# Patient Record
Sex: Male | Born: 1950 | Hispanic: No | Smoking: Never smoker
Health system: Southern US, Community
[De-identification: ages and names within clinical notes are randomized; demographics above are authoritative.]

## PROBLEM LIST (undated history)

## (undated) DIAGNOSIS — E785 Hyperlipidemia, unspecified: Secondary | ICD-10-CM

## (undated) DIAGNOSIS — E119 Type 2 diabetes mellitus without complications: Secondary | ICD-10-CM

## (undated) HISTORY — DX: Type 2 diabetes mellitus without complications: E11.9

---

## 2002-03-20 ENCOUNTER — Encounter: Payer: Self-pay | Admitting: Urology

## 2002-03-20 ENCOUNTER — Encounter: Admission: RE | Admit: 2002-03-20 | Discharge: 2002-03-20 | Payer: Self-pay | Admitting: Urology

## 2002-06-30 ENCOUNTER — Emergency Department (HOSPITAL_COMMUNITY): Admission: EM | Admit: 2002-06-30 | Discharge: 2002-06-30 | Payer: Self-pay | Admitting: Emergency Medicine

## 2002-07-05 ENCOUNTER — Emergency Department (HOSPITAL_COMMUNITY): Admission: EM | Admit: 2002-07-05 | Discharge: 2002-07-05 | Payer: Self-pay | Admitting: Emergency Medicine

## 2002-07-30 ENCOUNTER — Emergency Department (HOSPITAL_COMMUNITY): Admission: EM | Admit: 2002-07-30 | Discharge: 2002-07-31 | Payer: Self-pay | Admitting: Emergency Medicine

## 2002-07-31 ENCOUNTER — Encounter: Payer: Self-pay | Admitting: Emergency Medicine

## 2003-02-08 ENCOUNTER — Emergency Department (HOSPITAL_COMMUNITY): Admission: EM | Admit: 2003-02-08 | Discharge: 2003-02-08 | Payer: Self-pay

## 2005-05-20 ENCOUNTER — Emergency Department (HOSPITAL_COMMUNITY): Admission: EM | Admit: 2005-05-20 | Discharge: 2005-05-20 | Payer: Self-pay | Admitting: Family Medicine

## 2008-09-26 ENCOUNTER — Encounter: Admission: RE | Admit: 2008-09-26 | Discharge: 2008-09-26 | Payer: Self-pay | Admitting: Internal Medicine

## 2010-04-19 ENCOUNTER — Ambulatory Visit: Payer: Self-pay | Admitting: Internal Medicine

## 2010-04-19 ENCOUNTER — Ambulatory Visit (HOSPITAL_COMMUNITY): Admission: RE | Admit: 2010-04-19 | Discharge: 2010-04-19 | Payer: Self-pay | Admitting: Family Medicine

## 2010-04-19 ENCOUNTER — Encounter (INDEPENDENT_AMBULATORY_CARE_PROVIDER_SITE_OTHER): Payer: Self-pay | Admitting: Family Medicine

## 2010-04-19 LAB — CONVERTED CEMR LAB
Alkaline Phosphatase: 79 units/L (ref 39–117)
Basophils Absolute: 0 10*3/uL (ref 0.0–0.1)
Basophils Relative: 0 % (ref 0–1)
CO2: 22 meq/L (ref 19–32)
Cholesterol: 127 mg/dL (ref 0–200)
Creatinine, Ser: 0.98 mg/dL (ref 0.40–1.50)
Eosinophils Absolute: 0.1 10*3/uL (ref 0.0–0.7)
Glucose, Bld: 96 mg/dL (ref 70–99)
HDL: 46 mg/dL (ref >39)
LDL Cholesterol: 64 mg/dL (ref 0–99)
MCHC: 33.4 g/dL (ref 30.0–36.0)
MCV: 92.2 fL (ref 78.0–100.0)
Monocytes Absolute: 0.4 10*3/uL (ref 0.1–1.0)
Monocytes Relative: 8 % (ref 3–12)
Neutro Abs: 3.6 10*3/uL (ref 1.7–7.7)
Neutrophils Relative %: 66 % (ref 43–77)
RBC: 5.29 M/uL (ref 4.22–5.81)
RDW: 13.7 % (ref 11.5–15.5)
Total Bilirubin: 0.6 mg/dL (ref 0.3–1.2)
Total CHOL/HDL Ratio: 2.8
Triglycerides: 84 mg/dL (ref <150)
VLDL: 17 mg/dL (ref 0–40)

## 2010-06-07 ENCOUNTER — Encounter (INDEPENDENT_AMBULATORY_CARE_PROVIDER_SITE_OTHER): Payer: Self-pay | Admitting: Family Medicine

## 2010-06-07 ENCOUNTER — Ambulatory Visit: Payer: Self-pay | Admitting: Internal Medicine

## 2010-06-07 LAB — CONVERTED CEMR LAB
Bacteria, UA: NONE SEEN
Casts: NONE SEEN /lpf

## 2010-06-15 ENCOUNTER — Ambulatory Visit: Payer: Self-pay | Admitting: Internal Medicine

## 2011-05-14 ENCOUNTER — Emergency Department (HOSPITAL_COMMUNITY)
Admission: EM | Admit: 2011-05-14 | Discharge: 2011-05-14 | Disposition: A | Discharge status: Home / Self Care | Payer: Self-pay | Attending: Emergency Medicine | Admitting: Emergency Medicine

## 2011-05-14 ENCOUNTER — Inpatient Hospital Stay (INDEPENDENT_AMBULATORY_CARE_PROVIDER_SITE_OTHER)
Admission: RE | Admit: 2011-05-14 | Discharge: 2011-05-14 | Disposition: A | Discharge status: Home / Self Care | Payer: Self-pay | Source: Ambulatory Visit | Attending: Family Medicine | Admitting: Family Medicine

## 2011-05-14 ENCOUNTER — Emergency Department (HOSPITAL_COMMUNITY): Payer: Self-pay

## 2011-05-14 DIAGNOSIS — R12 Heartburn: Secondary | ICD-10-CM | POA: Insufficient documentation

## 2011-05-14 DIAGNOSIS — M25529 Pain in unspecified elbow: Secondary | ICD-10-CM | POA: Insufficient documentation

## 2011-05-14 DIAGNOSIS — M25519 Pain in unspecified shoulder: Secondary | ICD-10-CM | POA: Insufficient documentation

## 2011-05-14 DIAGNOSIS — E7800 Pure hypercholesterolemia, unspecified: Secondary | ICD-10-CM | POA: Insufficient documentation

## 2012-03-31 ENCOUNTER — Encounter (HOSPITAL_COMMUNITY): Payer: Self-pay | Admitting: Physical Medicine and Rehabilitation

## 2012-03-31 ENCOUNTER — Emergency Department (HOSPITAL_COMMUNITY)
Admission: EM | Admit: 2012-03-31 | Discharge: 2012-03-31 | Disposition: A | Discharge status: Home / Self Care | Payer: Self-pay | Attending: Emergency Medicine | Admitting: Emergency Medicine

## 2012-03-31 DIAGNOSIS — R51 Headache: Secondary | ICD-10-CM | POA: Insufficient documentation

## 2012-03-31 DIAGNOSIS — W19XXXA Unspecified fall, initial encounter: Secondary | ICD-10-CM

## 2012-03-31 DIAGNOSIS — M79609 Pain in unspecified limb: Secondary | ICD-10-CM | POA: Insufficient documentation

## 2012-03-31 DIAGNOSIS — S0180XA Unspecified open wound of other part of head, initial encounter: Secondary | ICD-10-CM | POA: Insufficient documentation

## 2012-03-31 DIAGNOSIS — T148XXA Other injury of unspecified body region, initial encounter: Secondary | ICD-10-CM

## 2012-03-31 DIAGNOSIS — W010XXA Fall on same level from slipping, tripping and stumbling without subsequent striking against object, initial encounter: Secondary | ICD-10-CM | POA: Insufficient documentation

## 2012-03-31 DIAGNOSIS — IMO0002 Reserved for concepts with insufficient information to code with codable children: Secondary | ICD-10-CM | POA: Insufficient documentation

## 2012-03-31 DIAGNOSIS — E785 Hyperlipidemia, unspecified: Secondary | ICD-10-CM | POA: Insufficient documentation

## 2012-03-31 HISTORY — DX: Hyperlipidemia, unspecified: E78.5

## 2012-03-31 MED ORDER — TETANUS-DIPHTH-ACELL PERTUSSIS 5-2.5-18.5 LF-MCG/0.5 IM SUSP
0.5000 mL | Freq: Once | INTRAMUSCULAR | Status: AC
  Administered 2012-03-31: 0.5 mL via INTRAMUSCULAR
  Filled 2012-03-31: qty 0.5

## 2012-03-31 NOTE — Discharge Instructions (Signed)
Laceration Care, Adult A laceration is a cut or lesion that goes through all layers of the skin and into the tissue just beneath the skin. TREATMENT  Some lacerations may not require closure. Some lacerations may not be able to be closed due to an increased risk of infection. It is important to see your caregiver as soon as possible after an injury to minimize the risk of infection and maximize the opportunity for successful closure. If closure is appropriate, pain medicines may be given, if needed. The wound will be cleaned to help prevent infection. Your caregiver will use stitches (sutures), staples, wound glue (adhesive), or skin adhesive strips to repair the laceration. These tools bring the skin edges together to allow for faster healing and a better cosmetic outcome. However, all wounds will heal with a scar. Once the wound has healed, scarring can be minimized by covering the wound with sunscreen during the day for 1 full year. HOME CARE INSTRUCTIONS  For sutures or staples:  Keep the wound clean and dry.   If you were given a bandage (dressing), you should change it at least once a day. Also, change the dressing if it becomes wet or dirty, or as directed by your caregiver.   Wash the wound with soap and water 2 times a day. Rinse the wound off with water to remove all soap. Pat the wound dry with a clean towel.   After cleaning, apply a thin layer of the antibiotic ointment as recommended by your caregiver. This will help prevent infection and keep the dressing from sticking.   You may shower as usual after the first 24 hours. Do not soak the wound in water until the sutures are removed.   Only take over-the-counter or prescription medicines for pain, discomfort, or fever as directed by your caregiver.   Get your sutures or staples removed as directed by your caregiver.  For skin adhesive strips:  Keep the wound clean and dry.   Do not get the skin adhesive strips wet. You may bathe  carefully, using caution to keep the wound dry.   If the wound gets wet, pat it dry with a clean towel.   Skin adhesive strips will fall off on their own. You may trim the strips as the wound heals. Do not remove skin adhesive strips that are still stuck to the wound. They will fall off in time.  For wound adhesive:  You may briefly wet your wound in the shower or bath. Do not soak or scrub the wound. Do not swim. Avoid periods of heavy perspiration until the skin adhesive has fallen off on its own. After showering or bathing, gently pat the wound dry with a clean towel.   Do not apply liquid medicine, cream medicine, or ointment medicine to your wound while the skin adhesive is in place. This may loosen the film before your wound is healed.   If a dressing is placed over the wound, be careful not to apply tape directly over the skin adhesive. This may cause the adhesive to be pulled off before the wound is healed.   Avoid prolonged exposure to sunlight or tanning lamps while the skin adhesive is in place. Exposure to ultraviolet light in the first year will darken the scar.   The skin adhesive will usually remain in place for 5 to 10 days, then naturally fall off the skin. Do not pick at the adhesive film.  You may need a tetanus shot if:  You   cannot remember when you had your last tetanus shot.   You have never had a tetanus shot.  If you get a tetanus shot, your arm may swell, get red, and feel warm to the touch. This is common and not a problem. If you need a tetanus shot and you choose not to have one, there is a rare chance of getting tetanus. Sickness from tetanus can be serious. SEEK MEDICAL CARE IF:   You have redness, swelling, or increasing pain in the wound.   You see a red line that goes away from the wound.   You have yellowish-white fluid (pus) coming from the wound.   You have a fever.   You notice a bad smell coming from the wound or dressing.   Your wound breaks  open before or after sutures have been removed.   You notice something coming out of the wound such as wood or glass.   Your wound is on your hand or foot and you cannot move a finger or toe.  SEEK IMMEDIATE MEDICAL CARE IF:   Your pain is not controlled with prescribed medicine.   You have severe swelling around the wound causing pain and numbness or a change in color in your arm, hand, leg, or foot.   Your wound splits open and starts bleeding.   You have worsening numbness, weakness, or loss of function of any joint around or beyond the wound.   You develop painful lumps near the wound or on the skin anywhere on your body.  MAKE SURE YOU:   Understand these instructions.   Will watch your condition.   Will get help right away if you are not doing well or get worse.  Document Released: 11/28/2005 Document Revised: 11/17/2011 Document Reviewed: 05/24/2011 ExitCare Patient Information 2012 [employer]  RESOURCE GUIDE  Dental Problems  Patients with Medicaid: [facility]                     [facility] [address]                                           [address] Phone:  [phone]                                                   Phone:  [phone]  If unable to pay or uninsured, contact:  [facility] or [facility] to become qualified for the adult dental clinic.  Chronic Pain Problems Contact [facility]  [phone] Patients need to be referred by their primary care doctor.  Insufficient Money for Medicine Contact [employer]:  call "211" or [facility] 271-5999.  No Primary Care Doctor Call [facility]  832-8000 Other agencies that provide inexpensive medical care    [facility]  [phone]    [facility]  [phone]    [facility]  [phone]    [facility]  [phone]    [facility]  [phone]    [facility]   [phone]  Psychological Services [facility]  [phone] [facility]  [phone] [facility]   [phone] (emergency   services 641-4993)  Abuse/Neglect [place] Child Abuse Hotline (336) 641-3795 [place] Child Abuse Hotline 800-378-5315 (After Hours)  Emergency Shelter [facility] (336) 271-5985  [facility] (336) 275-9566 [facility] (704) 372-4663  MRSA Hotline #:   832-7006    [place] [facility]  [facility] [place]  [employer]                           [facility] [address] [place]                     [address]         [address]                                                 [place]                              [place] Phone:  [phone]                                  Phone:  [phone]                   Phone:  [phone]  [facility] Phone:  [phone]  [place] Child Abuse Hotline (336) 342-1394 (336) 342-3537 (After Hours)  

## 2012-03-31 NOTE — ED Provider Notes (Signed)
History     CSN: [id number]  Arrival date & time 03/31/12  0825   First MD Initiated Contact with Patient 03/31/12 0830      Chief Complaint  Patient presents with  . Fall  . Laceration    (Consider location/radiation/quality/duration/timing/severity/associated sxs/prior treatment) Patient is a 61 y.o. male presenting with fall and skin laceration.  Fall  Laceration     61yoM pw mutliple abrasions/lacerations after fall. Patient states that just prior to arrival he was walking his dog. He states that the dog lunged and toward another dog pulling him by the arm and causing him to fall onto the sidewalk. He states that he is dragged for a few feet before he let go of the leash. He states that he scraped his face but did not hit his head. He had no loss of consciousness. There is no neck pain, back pain. He complains of minimal pain at the site of multiple abrasions to the dorsal aspect of his right hand as well as minimal pain to his right eyebrow. There's been no change in vision. No headache, dizziness, nausea, vomiting. Unknown last tetanus. Denies anticoagulants.   ED Notes, ED Provider Notes from 03/31/12 0000 to 03/31/12 08:28:17       Juanda Chance, RN 03/31/2012 08:27      Pt presents to department via EMS for evaluation of multiple abrasions and lacerations. States he was walking dog when he got dragged on ground. Abrasions to R hand and knee, 1inch laceration noted to R eyebrow area. Bleeding controlled. Denies pain upon arrival to ED. He is conscious alert and oriented x4.      Past Medical History  Diagnosis Date  . Hyperlipemia     No past surgical history on file.  History reviewed. No pertinent family history.  History  Substance Use Topics  . Smoking status: Never Smoker   . Smokeless tobacco: Not on file  . Alcohol Use: No      Review of Systems  All other systems reviewed and are negative.   except as noted HPI   Allergies  Review of  patient's allergies indicates no known allergies.  Home Medications   Current Outpatient Rx  Name Route Sig Dispense Refill  . ROSUVASTATIN CALCIUM 10 MG PO TABS Oral Take 10 mg by mouth daily.      BP 120/80  Pulse 60  Temp(Src) 97.6 F (36.4 C) (Oral)  Resp 16  SpO2 98%  Physical Exam  Nursing note and vitals reviewed. Constitutional: He is oriented to person, place, and time. He appears well-developed and well-nourished. No distress.  HENT:  Head: Atraumatic.    Mouth/Throat: Oropharynx is clear and moist.       No ttp facial bones including orbits, bridge of nose Poor dentition, intact TM clear   Eyes: Conjunctivae are normal. Pupils are equal, round, and reactive to light.  Neck: Neck supple.  Cardiovascular: Normal rate, regular rhythm, normal heart sounds and intact distal pulses.  Exam reveals no gallop and no friction rub.   No murmur heard. Pulmonary/Chest: Effort normal. No respiratory distress. He has no wheezes. He has no rales.  Abdominal: Soft. Bowel sounds are normal. There is no tenderness. There is no rebound and no guarding.  Musculoskeletal: Normal range of motion. He exhibits no edema and no tenderness.       RUE without bony ttp  Neurological: He is alert and oriented to person, place, and time. No cranial nerve deficit. He exhibits normal  muscle tone. Coordination normal.       Strength 5/5 all extremities    Skin: Skin is warm and dry.     Psychiatric: He has a normal mood and affect.    ED Course  LACERATION REPAIR Date/Time: 03/31/2012 9:22 AM Performed by: Forbes Cellar Authorized by: Forbes Cellar Consent: Verbal consent obtained. Consent given by: patient Patient understanding: patient states understanding of the procedure being performed Patient consent: the patient's understanding of the procedure matches consent given Procedure consent: procedure consent matches procedure scheduled Patient identity confirmed: arm band Time  out: Immediately prior to procedure a "time out" was called to verify the correct patient, procedure, equipment, support staff and site/side marked as required. Body area: head/neck Location details: right eyelid Laceration length: 1 cm Foreign bodies: no foreign bodies Tendon involvement: none Nerve involvement: none Vascular damage: no Anesthesia: local infiltration Local anesthetic: lidocaine 1% without epinephrine Anesthetic total: 1 ml Patient sedated: no Irrigation solution: saline Amount of cleaning: standard Debridement: none Skin closure: 6-0 nylon Number of sutures: 2 Technique: simple Approximation: close Approximation difficulty: simple Dressing: antibiotic ointment Patient tolerance: Patient tolerated the procedure well with no immediate complications.   (including critical care time)  Labs Reviewed - No data to display No results found.   1. Fall   2. Laceration   3. Abrasion       MDM  Mechanical fall without significant BHT. Neuro intact. Tetanus updated today. Wound care and laceration repair. PMD f/u for suture removal.       Forbes Cellar, MD 03/31/12 [phone]

## 2012-03-31 NOTE — ED Notes (Signed)
Pt presents to department via EMS for evaluation of multiple abrasions and lacerations. States he was walking dog when he got dragged on ground. Abrasions to R hand and knee, 1inch laceration noted to R eyebrow area. Bleeding controlled. Denies pain upon arrival to ED. He is conscious alert and oriented x4.

## 2012-05-16 ENCOUNTER — Encounter (HOSPITAL_COMMUNITY)
Admission: EM | Disposition: A | Discharge status: Home / Self Care | Payer: Self-pay | Source: Home / Self Care | Attending: Neurosurgery

## 2012-05-16 ENCOUNTER — Encounter (HOSPITAL_COMMUNITY): Payer: Self-pay | Admitting: *Deleted

## 2012-05-16 ENCOUNTER — Inpatient Hospital Stay (HOSPITAL_COMMUNITY): Payer: Self-pay | Admitting: Anesthesiology

## 2012-05-16 ENCOUNTER — Inpatient Hospital Stay (HOSPITAL_COMMUNITY)
Admission: EM | Admit: 2012-05-16 | Discharge: 2012-05-29 | DRG: 027 | Disposition: A | Discharge status: Home Health | Payer: MEDICAID | Attending: Neurosurgery | Admitting: Neurosurgery

## 2012-05-16 ENCOUNTER — Encounter (HOSPITAL_COMMUNITY): Payer: Self-pay | Admitting: Anesthesiology

## 2012-05-16 ENCOUNTER — Emergency Department (HOSPITAL_COMMUNITY): Payer: Self-pay

## 2012-05-16 DIAGNOSIS — R5381 Other malaise: Secondary | ICD-10-CM | POA: Diagnosis present

## 2012-05-16 DIAGNOSIS — R488 Other symbolic dysfunctions: Secondary | ICD-10-CM | POA: Diagnosis present

## 2012-05-16 DIAGNOSIS — I6200 Nontraumatic subdural hemorrhage, unspecified: Principal | ICD-10-CM | POA: Diagnosis present

## 2012-05-16 DIAGNOSIS — E785 Hyperlipidemia, unspecified: Secondary | ICD-10-CM | POA: Diagnosis present

## 2012-05-16 DIAGNOSIS — S065X9A Traumatic subdural hemorrhage with loss of consciousness of unspecified duration, initial encounter: Secondary | ICD-10-CM

## 2012-05-16 DIAGNOSIS — R269 Unspecified abnormalities of gait and mobility: Secondary | ICD-10-CM | POA: Diagnosis present

## 2012-05-16 DIAGNOSIS — Z9181 History of falling: Secondary | ICD-10-CM

## 2012-05-16 HISTORY — PX: CRANIOTOMY: SHX93

## 2012-05-16 LAB — DIFFERENTIAL
Eosinophils Absolute: 0.2 10*3/uL (ref 0.0–0.7)
Lymphs Abs: 1.1 10*3/uL (ref 0.7–4.0)
Monocytes Absolute: 0.4 10*3/uL (ref 0.1–1.0)
Monocytes Relative: 8 % (ref 3–12)
Neutrophils Relative %: 68 % (ref 43–77)

## 2012-05-16 LAB — COMPREHENSIVE METABOLIC PANEL
Alkaline Phosphatase: 76 U/L (ref 39–117)
BUN: 16 mg/dL (ref 6–23)
Chloride: 104 mEq/L (ref 96–112)
Creatinine, Ser: 0.93 mg/dL (ref 0.50–1.35)
GFR calc Af Amer: >90 mL/min (ref >90)
Glucose, Bld: 157 mg/dL — ABNORMAL HIGH (ref 70–99)
Potassium: 4.5 mEq/L (ref 3.5–5.1)
Total Bilirubin: 0.3 mg/dL (ref 0.3–1.2)

## 2012-05-16 LAB — POCT I-STAT TROPONIN I

## 2012-05-16 LAB — PROTIME-INR: Prothrombin Time: 13 seconds (ref 11.6–15.2)

## 2012-05-16 LAB — CBC
HCT: 43 % (ref 39.0–52.0)
Hemoglobin: 14.8 g/dL (ref 13.0–17.0)
MCH: 30.4 pg (ref 26.0–34.0)
MCHC: 34.4 g/dL (ref 30.0–36.0)
RBC: 4.87 MIL/uL (ref 4.22–5.81)

## 2012-05-16 LAB — APTT: aPTT: 38 seconds — ABNORMAL HIGH (ref 24–37)

## 2012-05-16 SURGERY — CRANIOTOMY HEMATOMA EVACUATION SUBDURAL
Anesthesia: General | Site: Head | Wound class: Clean

## 2012-05-16 MED ORDER — DOCUSATE SODIUM 100 MG PO CAPS
100.0000 mg | ORAL_CAPSULE | Freq: Two times a day (BID) | ORAL | Status: DC
  Administered 2012-05-16 – 2012-05-18 (×5): 100 mg via ORAL
  Filled 2012-05-16 (×8): qty 1

## 2012-05-16 MED ORDER — PROMETHAZINE HCL 25 MG PO TABS: 12.5000 mg | ORAL_TABLET | ORAL | Status: DC | PRN

## 2012-05-16 MED ORDER — SODIUM CHLORIDE 0.9 % IR SOLN
Status: DC | PRN
  Administered 2012-05-16: 11:00:00

## 2012-05-16 MED ORDER — HYDROMORPHONE HCL PF 1 MG/ML IJ SOLN
0.2500 mg | INTRAMUSCULAR | Status: DC | PRN
  Administered 2012-05-16: 0.5 mg via INTRAVENOUS

## 2012-05-16 MED ORDER — MEPERIDINE HCL 50 MG/ML IJ SOLN: 6.2500 mg | INTRAMUSCULAR | Status: DC | PRN

## 2012-05-16 MED ORDER — SODIUM CHLORIDE 0.9 % IV SOLN
INTRAVENOUS | Status: DC | PRN
  Administered 2012-05-16: 12:00:00 via INTRAVENOUS

## 2012-05-16 MED ORDER — PROMETHAZINE HCL 25 MG/ML IJ SOLN
6.2500 mg | INTRAMUSCULAR | Status: DC | PRN
  Administered 2012-05-16: 6.25 mg via INTRAVENOUS
  Filled 2012-05-16: qty 1

## 2012-05-16 MED ORDER — SODIUM CHLORIDE 0.9 % IV SOLN
INTRAVENOUS | Status: DC
  Administered 2012-05-16 – 2012-05-23 (×8): via INTRAVENOUS

## 2012-05-16 MED ORDER — SODIUM CHLORIDE 0.9 % IV BOLUS (SEPSIS)
500.0000 mL | INTRAVENOUS | Status: AC
  Administered 2012-05-16: 500 mL via INTRAVENOUS

## 2012-05-16 MED ORDER — LIDOCAINE HCL (CARDIAC) 20 MG/ML IV SOLN
INTRAVENOUS | Status: DC | PRN
  Administered 2012-05-16: 50 mg via INTRAVENOUS

## 2012-05-16 MED ORDER — CEFAZOLIN SODIUM 1-5 GM-% IV SOLN
1.0000 g | Freq: Three times a day (TID) | INTRAVENOUS | Status: AC
  Administered 2012-05-16 – 2012-05-17 (×2): 1 g via INTRAVENOUS
  Filled 2012-05-16 (×4): qty 50

## 2012-05-16 MED ORDER — ONDANSETRON HCL 4 MG/2ML IJ SOLN
INTRAMUSCULAR | Status: DC | PRN
  Administered 2012-05-16: 4 mg via INTRAVENOUS

## 2012-05-16 MED ORDER — SODIUM CHLORIDE 0.9 % IV SOLN
500.0000 mg | Freq: Two times a day (BID) | INTRAVENOUS | Status: DC
  Administered 2012-05-16 – 2012-05-19 (×6): 500 mg via INTRAVENOUS
  Filled 2012-05-16 (×6): qty 5

## 2012-05-16 MED ORDER — ATORVASTATIN CALCIUM 10 MG PO TABS
10.0000 mg | ORAL_TABLET | Freq: Every day | ORAL | Status: DC
  Administered 2012-05-16 – 2012-05-28 (×12): 10 mg via ORAL
  Filled 2012-05-16 (×14): qty 1

## 2012-05-16 MED ORDER — ONDANSETRON HCL 4 MG/2ML IJ SOLN: 4.0000 mg | INTRAMUSCULAR | Status: DC | PRN

## 2012-05-16 MED ORDER — CEFAZOLIN SODIUM 1-5 GM-% IV SOLN
INTRAVENOUS | Status: DC | PRN
  Administered 2012-05-16: 2 g via INTRAVENOUS

## 2012-05-16 MED ORDER — THROMBIN 20000 UNITS EX KIT
PACK | CUTANEOUS | Status: DC | PRN
  Administered 2012-05-16: 12:00:00 via TOPICAL

## 2012-05-16 MED ORDER — LABETALOL HCL 5 MG/ML IV SOLN
INTRAVENOUS | Status: DC | PRN
  Administered 2012-05-16: 5 mg via INTRAVENOUS

## 2012-05-16 MED ORDER — GLYCOPYRROLATE 0.2 MG/ML IJ SOLN
INTRAMUSCULAR | Status: DC | PRN
  Administered 2012-05-16: .6 mg via INTRAVENOUS

## 2012-05-16 MED ORDER — 0.9 % SODIUM CHLORIDE (POUR BTL) OPTIME
TOPICAL | Status: DC | PRN
  Administered 2012-05-16: 1000 mL

## 2012-05-16 MED ORDER — LABETALOL HCL 5 MG/ML IV SOLN: 10.0000 mg | INTRAVENOUS | Status: DC | PRN

## 2012-05-16 MED ORDER — MORPHINE SULFATE 2 MG/ML IJ SOLN
1.0000 mg | INTRAMUSCULAR | Status: DC | PRN
  Administered 2012-05-16 – 2012-05-22 (×8): 2 mg via INTRAVENOUS
  Filled 2012-05-16 (×3): qty 1
  Filled 2012-05-16: qty 2
  Filled 2012-05-16 (×3): qty 1

## 2012-05-16 MED ORDER — FENTANYL CITRATE 0.05 MG/ML IJ SOLN
INTRAMUSCULAR | Status: DC | PRN
  Administered 2012-05-16: 50 ug via INTRAVENOUS
  Administered 2012-05-16: 150 ug via INTRAVENOUS
  Administered 2012-05-16: 50 ug via INTRAVENOUS

## 2012-05-16 MED ORDER — MIDAZOLAM HCL 2 MG/2ML IJ SOLN: 0.5000 mg | Freq: Once | INTRAMUSCULAR | Status: DC | PRN

## 2012-05-16 MED ORDER — SODIUM CHLORIDE 0.9 % IV SOLN
INTRAVENOUS | Status: DC | PRN
  Administered 2012-05-16 (×2): via INTRAVENOUS

## 2012-05-16 MED ORDER — VECURONIUM BROMIDE 10 MG IV SOLR
INTRAVENOUS | Status: DC | PRN
  Administered 2012-05-16: 2 mg via INTRAVENOUS

## 2012-05-16 MED ORDER — LIDOCAINE-EPINEPHRINE 1 %-1:100000 IJ SOLN
INTRAMUSCULAR | Status: DC | PRN
  Administered 2012-05-16: 4 mL

## 2012-05-16 MED ORDER — PROPOFOL 10 MG/ML IV EMUL
INTRAVENOUS | Status: DC | PRN
  Administered 2012-05-16: 200 mg via INTRAVENOUS

## 2012-05-16 MED ORDER — SUCCINYLCHOLINE CHLORIDE 20 MG/ML IJ SOLN
INTRAMUSCULAR | Status: DC | PRN
  Administered 2012-05-16: 100 mg via INTRAVENOUS

## 2012-05-16 MED ORDER — NEOSTIGMINE METHYLSULFATE 1 MG/ML IJ SOLN
INTRAMUSCULAR | Status: DC | PRN
  Administered 2012-05-16: 4 mg via INTRAVENOUS

## 2012-05-16 MED ORDER — MICROFIBRILLAR COLL HEMOSTAT EX PADS: MEDICATED_PAD | CUTANEOUS | Status: DC | PRN

## 2012-05-16 MED ORDER — ONDANSETRON HCL 4 MG PO TABS: 4.0000 mg | ORAL_TABLET | ORAL | Status: DC | PRN

## 2012-05-16 MED ORDER — PANTOPRAZOLE SODIUM 40 MG IV SOLR
40.0000 mg | Freq: Every day | INTRAVENOUS | Status: DC
  Administered 2012-05-16 – 2012-05-18 (×3): 40 mg via INTRAVENOUS
  Filled 2012-05-16 (×4): qty 40

## 2012-05-16 SURGICAL SUPPLY — 72 items
BAG DECANTER FOR FLEXI CONT (MISCELLANEOUS) ×2 IMPLANT
BANDAGE GAUZE 4  KLING STR (GAUZE/BANDAGES/DRESSINGS) IMPLANT
BIT DRILL WIRE PASS 1.3MM (BIT) ×1 IMPLANT
BLADE SURG 11 STRL SS (BLADE) ×2 IMPLANT
BLADE SURG ROTATE 9660 (MISCELLANEOUS) ×2 IMPLANT
BNDG COHESIVE 4X5 TAN NS LF (GAUZE/BANDAGES/DRESSINGS) IMPLANT
BRUSH SCRUB EZ PLAIN DRY (MISCELLANEOUS) ×2 IMPLANT
BUR ACORN 9.0 PRECISION (BURR) ×2 IMPLANT
BUR ROUTER D-58 CRANI (BURR) ×2 IMPLANT
CANISTER SUCTION 2500CC (MISCELLANEOUS) ×2 IMPLANT
CATH ROBINSON RED A/P 10FR (CATHETERS) ×2 IMPLANT
CLIP TI MEDIUM 6 (CLIP) IMPLANT
CLOTH BEACON ORANGE TIMEOUT ST (SAFETY) ×2 IMPLANT
CONT SPEC 4OZ CLIKSEAL STRL BL (MISCELLANEOUS) ×2 IMPLANT
CORDS BIPOLAR (ELECTRODE) ×2 IMPLANT
COVER TABLE BACK 60X90 (DRAPES) IMPLANT
DECANTER SPIKE VIAL GLASS SM (MISCELLANEOUS) IMPLANT
DERMABOND ADVANCED (GAUZE/BANDAGES/DRESSINGS)
DERMABOND ADVANCED .7 DNX12 (GAUZE/BANDAGES/DRESSINGS) IMPLANT
DRAIN SNY WOU 7FLT (WOUND CARE) ×2 IMPLANT
DRAPE NEUROLOGICAL W/INCISE (DRAPES) ×2 IMPLANT
DRAPE SURG 17X23 STRL (DRAPES) IMPLANT
DRAPE WARM FLUID 44X44 (DRAPE) ×2 IMPLANT
DRILL WIRE PASS 1.3MM (BIT) ×2
DRSG OPSITE 4X5.5 SM (GAUZE/BANDAGES/DRESSINGS) ×4 IMPLANT
ELECT CAUTERY BLADE 6.4 (BLADE) ×2 IMPLANT
ELECT REM PT RETURN 9FT ADLT (ELECTROSURGICAL) ×2
ELECTRODE REM PT RTRN 9FT ADLT (ELECTROSURGICAL) ×1 IMPLANT
EVACUATOR SILICONE 100CC (DRAIN) ×2 IMPLANT
GAUZE SPONGE 4X4 16PLY XRAY LF (GAUZE/BANDAGES/DRESSINGS) IMPLANT
GLOVE BIO SURGEON STRL SZ8 (GLOVE) ×4 IMPLANT
GLOVE BIOGEL PI IND STRL 7.0 (GLOVE) ×2 IMPLANT
GLOVE BIOGEL PI INDICATOR 7.0 (GLOVE) ×2
GLOVE EXAM NITRILE LRG STRL (GLOVE) IMPLANT
GLOVE EXAM NITRILE MD LF STRL (GLOVE) ×4 IMPLANT
GLOVE EXAM NITRILE XL STR (GLOVE) IMPLANT
GLOVE EXAM NITRILE XS STR PU (GLOVE) IMPLANT
GLOVE INDICATOR 8.5 STRL (GLOVE) ×2 IMPLANT
GLOVE SS BIOGEL STRL SZ 6.5 (GLOVE) ×2 IMPLANT
GLOVE SUPERSENSE BIOGEL SZ 6.5 (GLOVE) ×2
GOWN BRE IMP SLV AUR LG STRL (GOWN DISPOSABLE) ×2 IMPLANT
GOWN BRE IMP SLV AUR XL STRL (GOWN DISPOSABLE) ×2 IMPLANT
GOWN STRL REIN 2XL LVL4 (GOWN DISPOSABLE) IMPLANT
HEMOSTAT SURGICEL 2X14 (HEMOSTASIS) IMPLANT
HOOK DURA (MISCELLANEOUS) ×2 IMPLANT
KIT BASIN OR (CUSTOM PROCEDURE TRAY) ×2 IMPLANT
KIT ROOM TURNOVER OR (KITS) ×2 IMPLANT
NEEDLE HYPO 25X1 1.5 SAFETY (NEEDLE) ×2 IMPLANT
NS IRRIG 1000ML POUR BTL (IV SOLUTION) ×4 IMPLANT
PACK CRANIOTOMY (CUSTOM PROCEDURE TRAY) ×2 IMPLANT
PAD ARMBOARD 7.5X6 YLW CONV (MISCELLANEOUS) ×4 IMPLANT
PAD EYE OVAL STERILE LF (GAUZE/BANDAGES/DRESSINGS) IMPLANT
PATTIES SURGICAL .25X.25 (GAUZE/BANDAGES/DRESSINGS) IMPLANT
PATTIES SURGICAL .5 X.5 (GAUZE/BANDAGES/DRESSINGS) IMPLANT
PATTIES SURGICAL .5 X3 (DISPOSABLE) IMPLANT
PATTIES SURGICAL 1X1 (DISPOSABLE) IMPLANT
PIN MAYFIELD SKULL DISP (PIN) IMPLANT
SPONGE GAUZE 4X4 12PLY (GAUZE/BANDAGES/DRESSINGS) ×2 IMPLANT
SPONGE NEURO XRAY DETECT 1X3 (DISPOSABLE) IMPLANT
SPONGE SURGIFOAM ABS GEL 100 (HEMOSTASIS) ×2 IMPLANT
STAPLER SKIN PROX WIDE 3.9 (STAPLE) ×2 IMPLANT
STAPLER VISISTAT 35W (STAPLE) IMPLANT
SUT ETHILON 3 0 FSL (SUTURE) IMPLANT
SUT NURALON 4 0 TR CR/8 (SUTURE) ×2 IMPLANT
SUT VIC AB 2-0 CT1 18 (SUTURE) ×2 IMPLANT
SYR 20ML ECCENTRIC (SYRINGE) ×2 IMPLANT
SYR CONTROL 10ML LL (SYRINGE) ×2 IMPLANT
TOWEL OR 17X24 6PK STRL BLUE (TOWEL DISPOSABLE) IMPLANT
TOWEL OR 17X26 10 PK STRL BLUE (TOWEL DISPOSABLE) ×4 IMPLANT
TRAY FOLEY CATH 14FRSI W/METER (CATHETERS) ×2 IMPLANT
UNDERPAD 30X30 INCONTINENT (UNDERPADS AND DIAPERS) IMPLANT
WATER STERILE IRR 1000ML POUR (IV SOLUTION) ×2 IMPLANT

## 2012-05-16 NOTE — ED Provider Notes (Signed)
History     CSN: [id number]  Arrival date & time 05/16/12  7829   First MD Initiated Contact with Patient 05/16/12 0818      Chief Complaint  Patient presents with  . Weakness    left side weakness  . Dizziness    (Consider location/radiation/quality/duration/timing/severity/associated sxs/prior treatment) HPI  Patient relates April 20 he fell while walking his dog. He relates he had a laceration near his lateral right eyebrow. He states he had 2 stitches. He had new loss of consciousness or neurological changes at that time. He was not on anticoagulants. He reports since then he's had intermittent episodes were he feels like he is dizzy it is going to pass out and he sits down he feels better in about 15 minutes. He relates 4 days ago his [place]. He relates the following day he started feeling like he was weak and he initially felt like he couldn't control both his legs. He relates yesterday he noticed he was not walking normally about 10 AM and his brother who is a physician commented he was dragging his left leg. He relates yesterday had a bad headache and states it was: Cranial without throbbing but he did have frontal pressure and some pain in his eyes. He states he doesn't normally have headaches like that. He denied any visual changes nausea or vomiting. Today he relates his headache is better but he took ibuprofen twice yesterday for the pain. He denies any numbness in his extremities. He denies chest pain or shortness of breath.  Pt last ate about 7 am today  PCP none  Past Medical History  Diagnosis Date  . Hyperlipemia      History reviewed. No pertinent past surgical history.  No family history on file.  No family history of strokes Father of patient died at age 77 from acute MI  History  Substance Use Topics  . Smoking status: Never Smoker   . Smokeless tobacco: Not on file  . 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    Allergies  Review of patient's allergies indicates no known allergies.  Home Medications   Current Outpatient Rx  Name Route Sig Dispense Refill  . ROSUVASTATIN CALCIUM 10 MG PO TABS Oral Take 10 mg by mouth daily.      BP 128/67  Pulse 63  Temp(Src) 97.7 F (36.5 C) (Oral)  Resp 16  SpO2 100%  Vital signs normal      Physical Exam  Nursing note and vitals reviewed. Constitutional: He is oriented to person, place, and time. He appears well-developed and well-nourished.  Non-toxic appearance. He does not appear ill. No distress.  HENT:  Head: Normocephalic and atraumatic.  Right Ear: External ear normal.  Left Ear: External ear normal.  Nose: Nose normal. No mucosal edema or rhinorrhea.  Mouth/Throat: Oropharynx is clear and moist and mucous membranes are normal. No dental abscesses or uvula swelling.  Eyes: Conjunctivae and EOM are normal. Pupils are equal, round, and reactive to light.  Neck: Normal range of motion and full passive range of motion without pain. Neck supple.  Cardiovascular: Normal rate, regular rhythm and normal heart sounds.  Exam reveals no gallop and no friction rub.   No murmur heard. Pulmonary/Chest: Effort normal and breath sounds normal. No respiratory distress. He has no wheezes. He has no rhonchi. He has no rales. He  exhibits no tenderness and no crepitus.  Abdominal: Soft. Normal appearance and bowel sounds are normal. He exhibits no distension. There is no tenderness. There is no rebound and no guarding.  Musculoskeletal: Normal range of motion. He exhibits no edema and no tenderness.       Moves all extremities well.   Neurological: He is alert and oriented to person, place, and time. He has normal strength. No cranial nerve deficit.       Patient noted to have mild left grip strength and some weakness in his left upper extremity. He had no Babinski. He was able  to lift his left leg against gravity but seemed to be more difficult in his right leg. He had no gross weakness in his right leg to exam. Patient has no gross visual field cuts. He has no cranial nerve deficit. Speech is normal  Skin: Skin is warm, dry and intact. No rash noted. No erythema. No pallor.  Psychiatric: He has a normal mood and affect. His speech is normal and behavior is normal. His mood appears not anxious.    ED Course  Procedures (including critical care time)  09:29 Dr Constance Goltz, radiology called CT results.   10:12 Cr Wynetta Emery, neurosurgery accepts in transfer to come straight to OR  Results for orders placed during the hospital encounter of 05/16/12  CBC      Component Value Range   WBC 5.5  4.0 - 10.5 (K/uL)   RBC 4.87  4.22 - 5.81 (MIL/uL)   Hemoglobin 14.8  13.0 - 17.0 (g/dL)   HCT 45.4  09.8 - 11.9 (%)   MCV 88.3  78.0 - 100.0 (fL)   MCH 30.4  26.0 - 34.0 (pg)   MCHC 34.4  30.0 - 36.0 (g/dL)   RDW 14.7  82.9 - 56.2 (%)   Platelets 168  150 - 400 (K/uL)  DIFFERENTIAL      Component Value Range   Neutrophils Relative 68  43 - 77 (%)   Neutro Abs 3.7  1.7 - 7.7 (K/uL)   Lymphocytes Relative 19  12 - 46 (%)   Lymphs Abs 1.1  0.7 - 4.0 (K/uL)   Monocytes Relative 8  3 - 12 (%)   Monocytes Absolute 0.4  0.1 - 1.0 (K/uL)   Eosinophils Relative 4  0 - 5 (%)   Eosinophils Absolute 0.2  0.0 - 0.7 (K/uL)   Basophils Relative 0  0 - 1 (%)   Basophils Absolute 0.0  0.0 - 0.1 (K/uL)  COMPREHENSIVE METABOLIC PANEL      Component Value Range   Sodium 138  135 - 145 (mEq/L)   Potassium 4.5  3.5 - 5.1 (mEq/L)   Chloride 104  96 - 112 (mEq/L)   CO2 23  19 - 32 (mEq/L)   Glucose, Bld 157 (*) 70 - 99 (mg/dL)   BUN 16  6 - 23 (mg/dL)   Creatinine, Ser 1.30  0.50 - 1.35 (mg/dL)   Calcium 8.3 (*) 8.4 - 10.5 (mg/dL)   Total Protein 7.1  6.0 - 8.3 (g/dL)   Albumin 3.9  3.5 - 5.2 (g/dL)   AST 36  0 - 37 (U/L)   ALT 28  0 - 53 (U/L)   Alkaline Phosphatase 76  39 - 117 (U/L)    Total Bilirubin 0.3  0.3 - 1.2 (mg/dL)   GFR calc non Af Amer 88 (*) >90 (mL/min)   GFR calc Af Amer >90  >90 (mL/min)  APTT  Component Value Range   aPTT 38 (*) 24 - 37 (seconds)  PROTIME-INR      Component Value Range   Prothrombin Time 13.0  11.6 - 15.2 (seconds)   INR 0.96  0.00 - 1.49   POCT I-STAT TROPONIN I      Component Value Range   Troponin i, poc 0.00  0.00 - 0.08 (ng/mL)   Comment 3            Laboratory interpretation all normal except hyperglycemia   Ct Head Wo Contrast  05/16/2012  *RADIOLOGY REPORT*  Clinical Data: Dizziness and weakness for past 2 days.  Fall in March.  CT HEAD WITHOUT CONTRAST  Technique:  Contiguous axial images were obtained from the base of the skull through the vertex without contrast.  Comparison: None.  Findings: Very large slightly loculated right-sided subdural hematoma.  Anterior right frontal region with maximal thickness of 2.4 cm.  Superior right posterior frontal - parietal region measures up to 2.6 cm.  Marked compression of the adjacent right hemisphere with compression of the right lateral ventricle and midline shift to the left by 10.8 mm.  The slight increased size of the left lateral ventricle may reflect the trapping of the ventricles by the midline shift.  No intracranial mass noted separate from the subdural hematoma.  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  No skull fracture.  IMPRESSION: Large right-sided subdural hematoma with midline shift to the left by 10.8 mm.  Critical Value/emergent results were called by telephone at the time of interpretation on 06/05/2013Please see above.  at 9:25 a.m. to  Dr. Lynelle Doctor, who verbally acknowledged these results.  Original Report Authenticated By: Fuller Canada, M.D.     Date: 05/16/2012  Rate: 77  Rhythm: normal sinus rhythm  QRS Axis: normal  Intervals: normal  ST/T Wave abnormalities: normal  Conduction Disutrbances:none   Narrative Interpretation:   Old EKG Reviewed: [facility] 05/14/2011      1. Subdural hematoma     Plan transfer to [facility] OR  Devoria Albe, MD, FACEP   MDM    CRITICAL CARE Performed by: Devoria Albe L   Total critical care time: 38 min Critical care time was exclusive of separately billable procedures and treating other patients.  Critical care was necessary to treat or prevent imminent or life-threatening deterioration.  Critical care was time spent personally by me on the following activities: development of treatment plan with patient and/or surrogate as well as nursing, discussions with consultants, evaluation of patient's response to treatment, examination of patient, obtaining history from patient or surrogate, ordering and performing treatments and interventions, ordering and review of laboratory studies, ordering and review of radiographic studies, pulse oximetry and re-evaluation of patient's condition.       Ward Givens, MD 05/16/12 1128  Ward Givens, MD 05/16/12 1128

## 2012-05-16 NOTE — Transfer of Care (Signed)
Immediate Anesthesia Transfer of Care Note  Patient: Frederick Knight  Procedure(s) Performed: Procedure(s) (LRB): CRANIOTOMY HEMATOMA EVACUATION SUBDURAL (N/A)  Patient Location: PACU  Anesthesia Type: General  Level of Consciousness: sedated  Airway & Oxygen Therapy: Patient Spontanous Breathing and Patient connected to nasal cannula oxygen  Post-op Assessment: Report given to PACU RN, Post -op Vital signs reviewed and stable and Patient moving all extremities  Post vital signs: Reviewed and stable  Complications: No apparent anesthesia complications

## 2012-05-16 NOTE — Op Note (Signed)
Preoperative diagnosis: Right subacute subdural hematoma   postoperative diagnosis: Same  Procedure: Ines Bloomer hole craniectomy for evacuation of right-sided subacute subdural hematoma  Surgeon Jillyn Hidden Mackenzey Crownover  Anesthesia Gen.  EBL: Minimal  History of present illness: Patient is a 61 year old gentleman who presented to the emergency room with a 24-48 hour history of headaches and dragging his left leg patient reports having had a fall about 5-6 weeks ago striking the right side his head with the rongeur was evaluated had stitches placed and was discharged. Done fairly well the initial several weeks however yesterday [worsening headache and dragging his right leg came to the ER this morning for a workup of possible stroke she revealed a large subacute subdural hematoma in the right patient and family were recommended emergent burr hole evacuation risks and benefits of the procedure were cemented patient as well as  Course expectations about alternatives of surgery. They understood these and agreed to proceed forward to  Operative procedure: Patient brought into the or was induced in general anesthesia positioned supine a shoulder bump under his right shoulder his head turned the left right frontal and parietal areas were shaved prepped and agrees to fashion 2 incisions were drawn out from along the superotemporal line and parietal superiorly behind the uterine side infiltrated with 5 cc lidocaine with epi and then incised 2 bur holes were drilled the dura was then coagulated and incised in a cruciate fashion several number was also opened up immediately the posterior bur hole there was very dark appearing and hematoma came out under pressure. The frontal bur hole that was then drilled and then irrigation was passed through the frontal to the posterior right parietal bur hole is irregular the catheter this was teased through the while irrigating from one hole the next irrigating all the hematoma there is no  solid clot appreciated cortex was easily visualized through both the frontal and parietal bur holes. And after the irrigant was clear a drain was placed from the parietal towards the frontal burr hole placed over a 3 [place] and then both incisions were irrigated and closed with after Vicryl and staples. Postop patient recovered in stable condition the case on it counts sponge counts were correct.

## 2012-05-16 NOTE — ED Notes (Signed)
edmd aware of ct results

## 2012-05-16 NOTE — ED Notes (Signed)
Pt states he started to have weakness and dizziness starting Monday. Pt states he feels as if he does not have full control of his left side. Pt does have some left side weakness. Pt states his having trouble with holding objects in his hand. Pt is alert and oriented at this time

## 2012-05-16 NOTE — Anesthesia Procedure Notes (Signed)
Procedure Name: Intubation Date/Time: 05/16/2012 11:57 AM Performed by: Luster Landsberg Pre-anesthesia Checklist: Patient identified, Emergency Drugs available, Suction available and Patient being monitored Patient Re-evaluated:Patient Re-evaluated prior to inductionOxygen Delivery Method: Circle system utilized Preoxygenation: Pre-oxygenation with 100% oxygen Intubation Type: IV induction, Cricoid Pressure applied and Rapid sequence Laryngoscope Size: Mac and 4 Grade View: Grade I Tube type: Oral Tube size: 7.5 mm Number of attempts: 1 Airway Equipment and Method: Stylet Placement Confirmation: ETT inserted through vocal cords under direct vision,  positive ETCO2 and breath sounds checked- equal and bilateral Secured at: 22 cm Tube secured with: Tape Dental Injury: Teeth and Oropharynx as per pre-operative assessment

## 2012-05-16 NOTE — Anesthesia Preprocedure Evaluation (Signed)
Anesthesia Evaluation  Patient identified by MRN, date of birth, ID band Patient awake    Reviewed: Allergy & Precautions, H&P , NPO status , Patient's Chart, lab work & pertinent test results  History of Anesthesia Complications Negative for: history of anesthetic complications  Airway Mallampati: I TM Distance: >3 FB Neck ROM: Full    Dental  (+) Poor Dentition, Missing, Chipped and Dental Advisory Given   Pulmonary neg pulmonary ROS,  breath sounds clear to auscultation        Cardiovascular negative cardio ROS  Rhythm:Regular Rate:Normal     Neuro/Psych Headaches and L sided weakness with SDH     GI/Hepatic Neg liver ROS, GERD-  Controlled,  Endo/Other  negative endocrine ROS  Renal/GU negative Renal ROS     Musculoskeletal   Abdominal   Peds  Hematology negative hematology ROS (+)   Anesthesia Other Findings   Reproductive/Obstetrics                           Anesthesia Physical Anesthesia Plan  ASA: II and Emergent  Anesthesia Plan: General   Post-op Pain Management:    Induction: Intravenous  Airway Management Planned: Oral ETT  Additional Equipment: Arterial line  Intra-op Plan:   Post-operative Plan: Extubation in OR  Informed Consent: I have reviewed the patients History and Physical, chart, labs and discussed the procedure including the risks, benefits and alternatives for the proposed anesthesia with the patient or authorized representative who has indicated his/her understanding and acceptance.   Dental advisory given  Plan Discussed with: Surgeon and CRNA  Anesthesia Plan Comments: (Plan routine monitors, A line, GETA.  Discussed with patient and his brother)        Anesthesia Quick Evaluation

## 2012-05-16 NOTE — H&P (Signed)
Frederick Knight is an 61 y.o. male.   Chief Complaint: Headaches left sided weakness HPI: Patient is a very pleasant 59 years on and fell back in March or April striking the right side his head went to the emergency room to [facility] apparently was sewn up without getting a CAT scan and discharged patient done fairly well the last several weeks until yesterday he was walking through [employer] with his brother his brother asked my retractor his left foot started having a severe headache a couple Tylenol for that. I placed it is blood pressures there was mildly elevated this morning was having some additional problems worsening headache and weakness a raised the possibility of a small stroke in which was a long to be evaluated. On evaluation patient was noted to have a with a CT scan of his head and a right-sided subacute subdural hematoma with marked midline shift. The patient was subsequently transferred here for bur hole back to a possible craniotomy for subdural hematoma. I have extensively gone over the risks and benefits of the procedure as well as perioperative course expectations of outcome alternatives of surgery and they understand and agree to proceed forward.  Past Medical History  Diagnosis Date  . Hyperlipemia     History reviewed. No pertinent past surgical history.  No family history on file. Social History:  reports that he has never smoked. He does not have any smokeless tobacco history on file. He reports that he does not drink alcohol or use illicit drugs.  Allergies: No Known Allergies   (Not in a hospital admission)  Results for orders placed during the hospital encounter of 05/16/12 (from the past 48 hour(s))  CBC     Status: Normal   Collection Time   05/16/12  9:04 AM      Component Value Range Comment   WBC 5.5  4.0 - 10.5 (K/uL)    RBC 4.87  4.22 - 5.81 (MIL/uL)    Hemoglobin 14.8  13.0 - 17.0 (g/dL)    HCT 16.1  09.6 - 04.5 (%)    MCV 88.3  78.0 - 100.0 (fL)    MCH  30.4  26.0 - 34.0 (pg)    MCHC 34.4  30.0 - 36.0 (g/dL)    RDW 40.9  81.1 - 91.4 (%)    Platelets 168  150 - 400 (K/uL)   DIFFERENTIAL     Status: Normal   Collection Time   05/16/12  9:04 AM      Component Value Range Comment   Neutrophils Relative 68  43 - 77 (%)    Neutro Abs 3.7  1.7 - 7.7 (K/uL)    Lymphocytes Relative 19  12 - 46 (%)    Lymphs Abs 1.1  0.7 - 4.0 (K/uL)    Monocytes Relative 8  3 - 12 (%)    Monocytes Absolute 0.4  0.1 - 1.0 (K/uL)    Eosinophils Relative 4  0 - 5 (%)    Eosinophils Absolute 0.2  0.0 - 0.7 (K/uL)    Basophils Relative 0  0 - 1 (%)    Basophils Absolute 0.0  0.0 - 0.1 (K/uL)   COMPREHENSIVE METABOLIC PANEL     Status: Abnormal   Collection Time   05/16/12  9:04 AM      Component Value Range Comment   Sodium 138  135 - 145 (mEq/L)    Potassium 4.5  3.5 - 5.1 (mEq/L)    Chloride 104  96 - 112 (  mEq/L)    CO2 23  19 - 32 (mEq/L)    Glucose, Bld 157 (*) 70 - 99 (mg/dL)    BUN 16  6 - 23 (mg/dL)    Creatinine, Ser 4.09  0.50 - 1.35 (mg/dL)    Calcium 8.3 (*) 8.4 - 10.5 (mg/dL)    Total Protein 7.1  6.0 - 8.3 (g/dL)    Albumin 3.9  3.5 - 5.2 (g/dL)    AST 36  0 - 37 (U/L)    ALT 28  0 - 53 (U/L)    Alkaline Phosphatase 76  39 - 117 (U/L)    Total Bilirubin 0.3  0.3 - 1.2 (mg/dL)    GFR calc non Af Amer 88 (*) >90 (mL/min)    GFR calc Af Amer >90  >90 (mL/min)   APTT     Status: Abnormal   Collection Time   05/16/12  9:04 AM      Component Value Range Comment   aPTT 38 (*) 24 - 37 (seconds)   PROTIME-INR     Status: Normal   Collection Time   05/16/12  9:04 AM      Component Value Range Comment   Prothrombin Time 13.0  11.6 - 15.2 (seconds)    INR 0.96  0.00 - 1.49    POCT I-STAT TROPONIN I     Status: Normal   Collection Time   05/16/12  9:13 AM      Component Value Range Comment   Troponin i, poc 0.00  0.00 - 0.08 (ng/mL)    Comment 3             Ct Head Wo Contrast  05/16/2012  *RADIOLOGY REPORT*  Clinical Data: Dizziness and weakness for  past 2 days.  Fall in March.  CT HEAD WITHOUT CONTRAST  Technique:  Contiguous axial images were obtained from the base of the skull through the vertex without contrast.  Comparison: None.  Findings: Very large slightly loculated right-sided subdural hematoma.  Anterior right frontal region with maximal thickness of 2.4 cm.  Superior right posterior frontal - parietal region measures up to 2.6 cm.  Marked compression of the adjacent right hemisphere with compression of the right lateral ventricle and midline shift to the left by 10.8 mm.  The slight increased size of the left lateral ventricle may reflect the trapping of the ventricles by the midline shift.  No intracranial mass noted separate from the subdural hematoma.  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  No skull fracture.  IMPRESSION: Large right-sided subdural hematoma with midline shift to the left by 10.8 mm.  Critical Value/emergent results were called by telephone at the time of interpretation on 06/05/2013Please see above.  at 9:25 a.m. to  Dr. Lynelle Doctor, who verbally acknowledged these results.  Original Report Authenticated By: Fuller Canada, M.D.    Review of Systems  Constitutional: Negative.   Eyes: Negative.   Cardiovascular: Negative.   Gastrointestinal: Negative.   Genitourinary: Negative.   Musculoskeletal: Negative.   Skin: Negative.   Neurological: Positive for dizziness, focal weakness and headaches.  Psychiatric/Behavioral: Negative.     Blood pressure 128/67, pulse 63, temperature 97.7 F (36.5 C), temperature source Oral, resp. rate 16, SpO2 100.00%. Physical Exam  Constitutional: He is oriented to person, place, and time. He appears well-developed and well-nourished.  HENT:  Head: Normocephalic and atraumatic.  Eyes: Pupils are equal, round, and reactive to light.  Neck: Neck  supple.  Cardiovascular: Normal rate.   Respiratory: Effort normal.  GI: Soft.    Musculoskeletal: Normal range of motion.  Neurological: He is alert and oriented to person, place, and time. GCS eye subscore is 4. GCS verbal subscore is 5. GCS motor subscore is 6.       Cranial nerves are intact pupils are equal round and reactive to light strength in his upper gym is appears to be 5 out of 5 with minimal to no evidence of a left pronator drift strength is looks was also appears to be 5 out of 5 his iliopsoas quads and she's gastrocs EHL. 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     Assessment/Plan 61 year old gentleman with a right-sided subacute subdural hematoma presents for bur hole possible craniotomy for evacuation.  Bekim Werntz P 05/16/2012, 11:01 AM

## 2012-05-16 NOTE — Anesthesia Postprocedure Evaluation (Signed)
  Anesthesia Post-op Note  Patient: Frederick Knight  Procedure(s) Performed: Procedure(s) (LRB): CRANIOTOMY HEMATOMA EVACUATION SUBDURAL (N/A)  Patient Location: PACU  Anesthesia Type: General  Level of Consciousness: sedated and patient cooperative  Airway and Oxygen Therapy: Patient Spontanous Breathing and Patient connected to nasal cannula oxygen  Post-op Pain: none  Post-op Assessment: Post-op Vital signs reviewed, Patient's Cardiovascular Status Stable, Respiratory Function Stable, Patent Airway, No signs of Nausea or vomiting and Pain level controlled  Post-op Vital Signs: Reviewed and stable  Complications: No apparent anesthesia complications

## 2012-05-17 ENCOUNTER — Inpatient Hospital Stay (HOSPITAL_COMMUNITY): Payer: Self-pay

## 2012-05-17 MED ORDER — HYDROCODONE-ACETAMINOPHEN 5-325 MG PO TABS
1.0000 | ORAL_TABLET | Freq: Four times a day (QID) | ORAL | Status: DC | PRN
  Administered 2012-05-17 – 2012-05-19 (×5): 2 via ORAL
  Administered 2012-05-20: 1 via ORAL
  Administered 2012-05-20: 2 via ORAL
  Administered 2012-05-21: 1 via ORAL
  Administered 2012-05-21 (×2): 2 via ORAL
  Filled 2012-05-17 (×10): qty 2

## 2012-05-17 NOTE — Progress Notes (Signed)
Subjective: Patient reports Well little bit of headache but his left side feels a lot better  Objective: Vital signs in last 24 hours: Temp:  [96.7 F (35.9 C)-98.1 F (36.7 C)] 98.1 F (36.7 C) (06/05 1950) Pulse Rate:  [26-73] 58  (06/06 0600) Resp:  [12-20] 15  (06/06 0600) BP: (92-164)/(54-83) 125/67 mmHg (06/06 0600) SpO2:  [93 %-100 %] 96 % (06/06 0600) Arterial Line BP: (99-141)/(60-127) 103/88 mmHg (06/06 0600) FiO2 (%):  [100 %] 100 % (06/05 1306) Weight:  [81.6 kg (179 lb 14.3 oz)] 81.6 kg (179 lb 14.3 oz) (06/05 1401)  Intake/Output from previous day: 06/05 0701 - 06/06 0700 In: 4082.8 [P.O.:740; I.V.:2668.8; IV Piggyback:265] Out: 1865 [Urine:1865] Intake/Output this shift:    Awake alert oriented strength out of 5 wound clean and dry  Lab Results:  [facility] 05/16/12 0904  WBC 5.5  HGB 14.8  HCT 43.0  PLT 168   BMET  Basename 05/16/12 0904  NA 138  K 4.5  CL 104  CO2 23  GLUCOSE 157*  BUN 16  CREATININE 0.93  CALCIUM 8.3*    Studies/Results: Ct Head Wo Contrast  05/17/2012  *RADIOLOGY REPORT*  Clinical Data: Follow-up subdural hematoma.  CT HEAD WITHOUT CONTRAST  Technique:  Contiguous axial images were obtained from the base of the skull through the vertex without contrast.  Comparison: 05/16/2012.  Findings: Interval surgical decompression of the right subdural hematoma with a subdural drainage catheter in place.  There is residual fluid and air in the subdural space.  5 mm of right to left shift persists.  No findings for infarction or intraparenchymal hemorrhage.  IMPRESSION: Evacuation of right sided subdural hematoma with residual subdural fluid and air.  Original Report Authenticated By: P. Loralie Champagne, M.D.   Ct Head Wo Contrast  05/16/2012  *RADIOLOGY REPORT*  Clinical Data: Dizziness and weakness for past 2 days.  Fall in March.  CT HEAD WITHOUT CONTRAST  Technique:  Contiguous axial images were obtained from the base of the skull through the  vertex without contrast.  Comparison: None.  Findings: Very large slightly loculated right-sided subdural hematoma.  Anterior right frontal region with maximal thickness of 2.4 cm.  Superior right posterior frontal - parietal region measures up to 2.6 cm.  Marked compression of the adjacent right hemisphere with compression of the right lateral ventricle and midline shift to the left by 10.8 mm.  The slight increased size of the left lateral ventricle may reflect the trapping of the ventricles by the midline shift.  No intracranial mass noted separate from the subdural hematoma.  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  No skull fracture.  IMPRESSION: Large right-sided subdural hematoma with midline shift to the left by 10.8 mm.  Critical Value/emergent results were called by telephone at the time of interpretation on 06/05/2013Please see above.  at 9:25 a.m. to  Dr. Lynelle Doctor, who verbally acknowledged these results.  Original Report Authenticated By: Fuller Canada, M.D.    Assessment/Plan: Postop day 1 from burr hole present CT scan shows significant improvement still some left frontal fluid we'll leave the drain in another day to see if we can drain out more of that  LOS: 1 day     Onesti Bonfiglio P 05/17/2012, 7:39 AM

## 2012-05-18 ENCOUNTER — Encounter (HOSPITAL_COMMUNITY): Payer: Self-pay | Admitting: Anesthesiology

## 2012-05-18 ENCOUNTER — Encounter (HOSPITAL_COMMUNITY)
Admission: EM | Disposition: A | Discharge status: Home / Self Care | Payer: Self-pay | Source: Home / Self Care | Attending: Neurosurgery

## 2012-05-18 ENCOUNTER — Encounter (HOSPITAL_COMMUNITY): Payer: Self-pay | Admitting: Neurosurgery

## 2012-05-18 ENCOUNTER — Inpatient Hospital Stay (HOSPITAL_COMMUNITY): Payer: Self-pay

## 2012-05-18 ENCOUNTER — Inpatient Hospital Stay (HOSPITAL_COMMUNITY): Payer: Self-pay | Admitting: Anesthesiology

## 2012-05-18 HISTORY — PX: CRANIOTOMY: SHX93

## 2012-05-18 LAB — CBC
HCT: 43.3 % (ref 39.0–52.0)
Hemoglobin: 14.8 g/dL (ref 13.0–17.0)
MCH: 30.1 pg (ref 26.0–34.0)
MCHC: 34.6 g/dL (ref 30.0–36.0)
MCHC: 34.2 g/dL (ref 30.0–36.0)
MCV: 88 fL (ref 78.0–100.0)
Platelets: 99 10*3/uL — ABNORMAL LOW (ref 150–400)
RDW: 13.2 % (ref 11.5–15.5)
RDW: 12.9 % (ref 11.5–15.5)

## 2012-05-18 LAB — BASIC METABOLIC PANEL
BUN: 10 mg/dL (ref 6–23)
BUN: 10 mg/dL (ref 6–23)
CO2: 26 mEq/L (ref 19–32)
Chloride: 102 mEq/L (ref 96–112)
Creatinine, Ser: 0.91 mg/dL (ref 0.50–1.35)
Creatinine, Ser: 1 mg/dL (ref 0.50–1.35)
GFR calc Af Amer: >90 mL/min (ref >90)
GFR calc Af Amer: >90 mL/min (ref >90)
GFR calc non Af Amer: 89 mL/min — ABNORMAL LOW (ref >90)
Glucose, Bld: 134 mg/dL — ABNORMAL HIGH (ref 70–99)
Potassium: 3.7 mEq/L (ref 3.5–5.1)

## 2012-05-18 LAB — PROTIME-INR
INR: 0.97 (ref 0.00–1.49)
Prothrombin Time: 13.1 seconds (ref 11.6–15.2)

## 2012-05-18 LAB — DIFFERENTIAL
Basophils Absolute: 0 10*3/uL (ref 0.0–0.1)
Lymphs Abs: 2.2 10*3/uL (ref 0.7–4.0)
Monocytes Absolute: 0.6 10*3/uL (ref 0.1–1.0)

## 2012-05-18 SURGERY — CRANIOTOMY HEMATOMA EVACUATION SUBDURAL
Anesthesia: General | Laterality: Right | Wound class: Clean

## 2012-05-18 MED ORDER — 0.9 % SODIUM CHLORIDE (POUR BTL) OPTIME
TOPICAL | Status: DC | PRN
  Administered 2012-05-18 (×4): 1000 mL

## 2012-05-18 MED ORDER — LIDOCAINE HCL (CARDIAC) 20 MG/ML IV SOLN
INTRAVENOUS | Status: DC | PRN
  Administered 2012-05-18: 50 mg via INTRAVENOUS

## 2012-05-18 MED ORDER — PROMETHAZINE HCL 25 MG PO TABS: 12.5000 mg | ORAL_TABLET | ORAL | Status: DC | PRN

## 2012-05-18 MED ORDER — SODIUM CHLORIDE 0.9 % IV SOLN
INTRAVENOUS | Status: DC | PRN
  Administered 2012-05-18: 18:00:00 via INTRAVENOUS

## 2012-05-18 MED ORDER — NEOSTIGMINE METHYLSULFATE 1 MG/ML IJ SOLN
INTRAMUSCULAR | Status: DC | PRN
  Administered 2012-05-18: 3 mg via INTRAVENOUS

## 2012-05-18 MED ORDER — ONDANSETRON HCL 4 MG/2ML IJ SOLN: 4.0000 mg | INTRAMUSCULAR | Status: DC | PRN

## 2012-05-18 MED ORDER — SODIUM CHLORIDE 0.9 % IV SOLN
500.0000 mg | Freq: Two times a day (BID) | INTRAVENOUS | Status: DC
  Administered 2012-05-18 – 2012-05-22 (×8): 500 mg via INTRAVENOUS
  Filled 2012-05-18 (×10): qty 5

## 2012-05-18 MED ORDER — LABETALOL HCL 5 MG/ML IV SOLN: 10.0000 mg | INTRAVENOUS | Status: DC | PRN

## 2012-05-18 MED ORDER — PANTOPRAZOLE SODIUM 40 MG IV SOLR
40.0000 mg | Freq: Every day | INTRAVENOUS | Status: DC
  Administered 2012-05-19 – 2012-05-20 (×2): 40 mg via INTRAVENOUS
  Filled 2012-05-18 (×4): qty 40

## 2012-05-18 MED ORDER — ACETAMINOPHEN 325 MG PO TABS: 650.0000 mg | ORAL_TABLET | ORAL | Status: DC | PRN

## 2012-05-18 MED ORDER — HYDROMORPHONE HCL PF 1 MG/ML IJ SOLN
INTRAMUSCULAR | Status: AC
  Administered 2012-05-18: 0.5 mg via INTRAVENOUS
  Filled 2012-05-18: qty 1

## 2012-05-18 MED ORDER — FENTANYL CITRATE 0.05 MG/ML IJ SOLN
INTRAMUSCULAR | Status: DC | PRN
  Administered 2012-05-18: 50 ug via INTRAVENOUS
  Administered 2012-05-18 (×2): 100 ug via INTRAVENOUS

## 2012-05-18 MED ORDER — HYDROCODONE-ACETAMINOPHEN 5-325 MG PO TABS
1.0000 | ORAL_TABLET | ORAL | Status: DC | PRN
  Administered 2012-05-22 (×2): 1 via ORAL
  Filled 2012-05-18: qty 1
  Filled 2012-05-18: qty 2
  Filled 2012-05-18: qty 1

## 2012-05-18 MED ORDER — ONDANSETRON HCL 4 MG/2ML IJ SOLN: 4.0000 mg | Freq: Once | INTRAMUSCULAR | Status: AC | PRN

## 2012-05-18 MED ORDER — ACETAMINOPHEN 650 MG RE SUPP: 650.0000 mg | RECTAL | Status: DC | PRN

## 2012-05-18 MED ORDER — MORPHINE SULFATE 2 MG/ML IJ SOLN
1.0000 mg | INTRAMUSCULAR | Status: DC | PRN
  Administered 2012-05-19 – 2012-05-26 (×3): 2 mg via INTRAVENOUS
  Filled 2012-05-18 (×4): qty 1

## 2012-05-18 MED ORDER — SODIUM CHLORIDE 0.9 % IR SOLN
Status: DC | PRN
  Administered 2012-05-18: 19:00:00

## 2012-05-18 MED ORDER — ONDANSETRON HCL 4 MG PO TABS: 4.0000 mg | ORAL_TABLET | ORAL | Status: DC | PRN

## 2012-05-18 MED ORDER — PROPOFOL 10 MG/ML IV EMUL
INTRAVENOUS | Status: DC | PRN
  Administered 2012-05-18: 50 mg via INTRAVENOUS
  Administered 2012-05-18: 150 mg via INTRAVENOUS

## 2012-05-18 MED ORDER — THROMBIN 20000 UNITS EX KIT
PACK | CUTANEOUS | Status: DC | PRN
  Administered 2012-05-18: 20000 [IU] via TOPICAL

## 2012-05-18 MED ORDER — LACTATED RINGERS IV SOLN
INTRAVENOUS | Status: DC | PRN
  Administered 2012-05-18: 17:00:00 via INTRAVENOUS

## 2012-05-18 MED ORDER — DOCUSATE SODIUM 100 MG PO CAPS
100.0000 mg | ORAL_CAPSULE | Freq: Two times a day (BID) | ORAL | Status: DC
  Administered 2012-05-18 – 2012-05-29 (×22): 100 mg via ORAL
  Filled 2012-05-18 (×18): qty 1

## 2012-05-18 MED ORDER — ONDANSETRON HCL 4 MG/2ML IJ SOLN
INTRAMUSCULAR | Status: DC | PRN
  Administered 2012-05-18: 4 mg via INTRAVENOUS

## 2012-05-18 MED ORDER — BACITRACIN 50000 UNITS IM SOLR
INTRAMUSCULAR | Status: AC
  Filled 2012-05-18: qty 1

## 2012-05-18 MED ORDER — SODIUM CHLORIDE 0.9 % IV SOLN
INTRAVENOUS | Status: AC
  Filled 2012-05-18: qty 500

## 2012-05-18 MED ORDER — GLYCOPYRROLATE 0.2 MG/ML IJ SOLN
INTRAMUSCULAR | Status: DC | PRN
  Administered 2012-05-18: 0.6 mg via INTRAVENOUS

## 2012-05-18 MED ORDER — ROCURONIUM BROMIDE 100 MG/10ML IV SOLN
INTRAVENOUS | Status: DC | PRN
  Administered 2012-05-18: 10 mg via INTRAVENOUS
  Administered 2012-05-18: 40 mg via INTRAVENOUS

## 2012-05-18 MED ORDER — HEMOSTATIC AGENTS (NO CHARGE) OPTIME
TOPICAL | Status: DC | PRN
  Administered 2012-05-18 (×3): 1 via TOPICAL

## 2012-05-18 MED ORDER — CEFAZOLIN SODIUM 1-5 GM-% IV SOLN
INTRAVENOUS | Status: AC
  Administered 2012-05-18: 1000 mg
  Filled 2012-05-18: qty 50

## 2012-05-18 MED ORDER — HYDROMORPHONE HCL PF 1 MG/ML IJ SOLN
0.2500 mg | INTRAMUSCULAR | Status: DC | PRN
  Administered 2012-05-18 (×5): 0.5 mg via INTRAVENOUS
  Filled 2012-05-18 (×3): qty 1

## 2012-05-18 MED ORDER — CEFAZOLIN SODIUM 1-5 GM-% IV SOLN
1.0000 g | Freq: Three times a day (TID) | INTRAVENOUS | Status: AC
  Administered 2012-05-18 – 2012-05-19 (×2): 1 g via INTRAVENOUS
  Filled 2012-05-18 (×3): qty 50

## 2012-05-18 SURGICAL SUPPLY — 73 items
BAG DECANTER FOR FLEXI CONT (MISCELLANEOUS) ×2 IMPLANT
BANDAGE GAUZE 4  KLING STR (GAUZE/BANDAGES/DRESSINGS) IMPLANT
BIT DRILL WIRE PASS 1.3MM (BIT) ×1 IMPLANT
BLADE SURG 11 STRL SS (BLADE) ×2 IMPLANT
BLADE SURG ROTATE 9660 (MISCELLANEOUS) IMPLANT
BNDG COHESIVE 4X5 TAN NS LF (GAUZE/BANDAGES/DRESSINGS) IMPLANT
BRUSH SCRUB EZ PLAIN DRY (MISCELLANEOUS) ×2 IMPLANT
BUR ACORN 9.0 PRECISION (BURR) IMPLANT
BUR ROUTER D-58 CRANI (BURR) IMPLANT
CANISTER SUCTION 2500CC (MISCELLANEOUS) ×2 IMPLANT
CLIP TI MEDIUM 6 (CLIP) IMPLANT
CLOTH BEACON ORANGE TIMEOUT ST (SAFETY) ×2 IMPLANT
CONT SPEC 4OZ CLIKSEAL STRL BL (MISCELLANEOUS) ×2 IMPLANT
CORDS BIPOLAR (ELECTRODE) ×2 IMPLANT
COVER TABLE BACK 60X90 (DRAPES) IMPLANT
DECANTER SPIKE VIAL GLASS SM (MISCELLANEOUS) ×2 IMPLANT
DERMABOND ADVANCED (GAUZE/BANDAGES/DRESSINGS) ×1
DERMABOND ADVANCED .7 DNX12 (GAUZE/BANDAGES/DRESSINGS) ×1 IMPLANT
DRAIN CHANNEL 7F 3/4 FLAT (WOUND CARE) ×2 IMPLANT
DRAPE NEUROLOGICAL W/INCISE (DRAPES) ×2 IMPLANT
DRAPE SURG 17X23 STRL (DRAPES) IMPLANT
DRAPE WARM FLUID 44X44 (DRAPE) ×2 IMPLANT
DRILL WIRE PASS 1.3MM (BIT) ×2
DRSG OPSITE 4X5.5 SM (GAUZE/BANDAGES/DRESSINGS) ×4 IMPLANT
ELECT CAUTERY BLADE 6.4 (BLADE) ×2 IMPLANT
ELECT REM PT RETURN 9FT ADLT (ELECTROSURGICAL) ×2
ELECTRODE REM PT RTRN 9FT ADLT (ELECTROSURGICAL) ×1 IMPLANT
EVACUATOR SILICONE 100CC (DRAIN) ×2 IMPLANT
GAUZE SPONGE 4X4 16PLY XRAY LF (GAUZE/BANDAGES/DRESSINGS) IMPLANT
GLOVE BIO SURGEON STRL SZ8 (GLOVE) ×2 IMPLANT
GLOVE ECLIPSE 7.5 STRL STRAW (GLOVE) ×6 IMPLANT
GLOVE EXAM NITRILE LRG STRL (GLOVE) IMPLANT
GLOVE EXAM NITRILE MD LF STRL (GLOVE) ×4 IMPLANT
GLOVE EXAM NITRILE XL STR (GLOVE) IMPLANT
GLOVE EXAM NITRILE XS STR PU (GLOVE) IMPLANT
GLOVE INDICATOR 8.5 STRL (GLOVE) ×2 IMPLANT
GLOVE SURG SS PI 8.0 STRL IVOR (GLOVE) ×10 IMPLANT
GOWN BRE IMP SLV AUR LG STRL (GOWN DISPOSABLE) ×2 IMPLANT
GOWN BRE IMP SLV AUR XL STRL (GOWN DISPOSABLE) ×2 IMPLANT
GOWN STRL REIN 2XL LVL4 (GOWN DISPOSABLE) ×6 IMPLANT
HEMOSTAT SURGICEL 2X14 (HEMOSTASIS) IMPLANT
HOOK DURA (MISCELLANEOUS) ×2 IMPLANT
KIT BASIN OR (CUSTOM PROCEDURE TRAY) ×2 IMPLANT
KIT REMOVER STAPLE SKIN (MISCELLANEOUS) ×2 IMPLANT
KIT ROOM TURNOVER OR (KITS) ×2 IMPLANT
NEEDLE HYPO 25X1 1.5 SAFETY (NEEDLE) ×2 IMPLANT
NS IRRIG 1000ML POUR BTL (IV SOLUTION) ×4 IMPLANT
PACK CRANIOTOMY (CUSTOM PROCEDURE TRAY) ×2 IMPLANT
PAD ARMBOARD 7.5X6 YLW CONV (MISCELLANEOUS) ×4 IMPLANT
PAD EYE OVAL STERILE LF (GAUZE/BANDAGES/DRESSINGS) IMPLANT
PATTIES SURGICAL .25X.25 (GAUZE/BANDAGES/DRESSINGS) IMPLANT
PATTIES SURGICAL .5 X.5 (GAUZE/BANDAGES/DRESSINGS) IMPLANT
PATTIES SURGICAL .5 X3 (DISPOSABLE) IMPLANT
PATTIES SURGICAL 1X1 (DISPOSABLE) IMPLANT
PIN MAYFIELD SKULL DISP (PIN) IMPLANT
PLATE 1.5/0.5 18.5MM BURR HOLE (Plate) ×2 IMPLANT
PLATE DOUBLE 6 HOLE (Plate) ×4 IMPLANT
SCREW SELF DRILL HT 1.5/4MM (Screw) ×24 IMPLANT
SPONGE GAUZE 4X4 12PLY (GAUZE/BANDAGES/DRESSINGS) ×4 IMPLANT
SPONGE NEURO XRAY DETECT 1X3 (DISPOSABLE) IMPLANT
SPONGE SURGIFOAM ABS GEL 100 (HEMOSTASIS) IMPLANT
STAPLER SKIN PROX WIDE 3.9 (STAPLE) ×4 IMPLANT
STAPLER VISISTAT 35W (STAPLE) IMPLANT
SUT ETHILON 3 0 FSL (SUTURE) IMPLANT
SUT NURALON 4 0 TR CR/8 (SUTURE) ×4 IMPLANT
SUT VIC AB 2-0 CT1 18 (SUTURE) ×4 IMPLANT
SYR 20ML ECCENTRIC (SYRINGE) ×2 IMPLANT
SYR CONTROL 10ML LL (SYRINGE) ×2 IMPLANT
TOWEL OR 17X24 6PK STRL BLUE (TOWEL DISPOSABLE) ×2 IMPLANT
TOWEL OR 17X26 10 PK STRL BLUE (TOWEL DISPOSABLE) ×2 IMPLANT
TRAY FOLEY CATH 14FRSI W/METER (CATHETERS) ×2 IMPLANT
UNDERPAD 30X30 INCONTINENT (UNDERPADS AND DIAPERS) IMPLANT
WATER STERILE IRR 1000ML POUR (IV SOLUTION) ×2 IMPLANT

## 2012-05-18 NOTE — Anesthesia Procedure Notes (Signed)
Date/Time: 05/18/2012 5:49 PM Performed by: Gwenyth Allegra Pre-anesthesia Checklist: Patient identified, Timeout performed, Emergency Drugs available and Suction available Patient Re-evaluated:Patient Re-evaluated prior to inductionOxygen Delivery Method: Circle system utilized Preoxygenation: Pre-oxygenation with 100% oxygen Intubation Type: IV induction Ventilation: Mask ventilation without difficulty and Oral airway inserted - appropriate to patient size Laryngoscope Size: Mac and 4 Grade View: Grade I Tube type: Oral Tube size: 7.5 mm Number of attempts: 1 Airway Equipment and Method: Stylet Placement Confirmation: ETT inserted through vocal cords under direct vision,  breath sounds checked- equal and bilateral and positive ETCO2 Secured at: 22 cm Tube secured with: Tape Dental Injury: Teeth and Oropharynx as per pre-operative assessment

## 2012-05-18 NOTE — Anesthesia Preprocedure Evaluation (Addendum)
Anesthesia Evaluation   Patient awake    Reviewed: Allergy & Precautions, reviewed documented beta blocker date and time   Airway Mallampati: II  Neck ROM: Full    Dental  (+) Missing, Chipped and Dental Advisory Given   Pulmonary    Pulmonary exam normal       Cardiovascular Rhythm:Regular     Neuro/Psych    GI/Hepatic   Endo/Other    Renal/GU      Musculoskeletal   Abdominal Normal abdominal exam  (+)   Peds  Hematology   Anesthesia Other Findings   Reproductive/Obstetrics                           Anesthesia Physical Anesthesia Plan  ASA: III  Anesthesia Plan:    Post-op Pain Management:    Induction:   Airway Management Planned:   Additional Equipment:   Intra-op Plan:   Post-operative Plan:   Informed Consent:   Plan Discussed with:   Anesthesia Plan Comments:         Anesthesia Quick Evaluation

## 2012-05-18 NOTE — Transfer of Care (Signed)
Immediate Anesthesia Transfer of Care Note  Patient: Frederick Knight  Procedure(s) Performed: Procedure(s) (LRB): CRANIOTOMY HEMATOMA EVACUATION SUBDURAL (Right)  Patient Location: PACU  Anesthesia Type: General  Level of Consciousness: awake and sedated  Airway & Oxygen Therapy: Patient Spontanous Breathing and Patient connected to nasal cannula oxygen  Post-op Assessment: Report given to PACU RN and Post -op Vital signs reviewed and stable  Post vital signs: Reviewed and stable  Complications: No apparent anesthesia complications

## 2012-05-18 NOTE — Progress Notes (Signed)
Patient ID: Frederick Knight, male   DOB: 1951/06/21, 60 y.o.   MRN: [id number] CT scan shows persistent right frontal collection that was not draining through his drain. Recommended a right frontal craniotomy for reevaluation of a subdural fluid collection. I've explained the risks and benefits of the operation to the patient I will be discussed explaining to the family later when they show up. He will schedule this for later this afternoon.

## 2012-05-18 NOTE — Op Note (Signed)
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  EBL: Minimal  History of present illness: Patient is a 61 year old gentleman who presented emergent on a Wednesday with right sided subdural and left hemiparesis the patient he or underwent burr hole evacuation of his subdural hematoma postoperatively followup scan showed residual persistent right frontal fluid collection the patient was recommended about the OR and 2 for a right frontal craniotomy for evacuation of the subdural fluid risks and benefits were cemented patient and family as well as  Of course expectations of outcome alternatives surgery they understand and agree to proceed forward.  Operative procedure: Patient brought in the or was induced in general anesthesia positioned supine the head turned the left side with a shoulder bump under his right shoulder and in the right frontal burr hole exposed. His incision was incised more rostrally and caudally. The subperiosteal cyst was carried out around the burr hole and then using the footplate starting within the burr hole a crane flap was turned the dura was then coagulated and incised a cruciate fashion medially visualized was a fairly thick subdural membrane this is all lysed the brain was immediately visualized a lot of motor Rollo-appearing hematoma was immediately aspirated under pressure working in shipping is much membrane off the undersurface of the dura around the craniotomy site allowed further visualization the cortex but proximally and distally. The area was then copiously irrigated until clear irrigant came out then a drain was placed and the dura was reapproximated with Neurolons a large piece of Gelfoam was applied and a crane flap is reapplied overlying the drain. The wounds and closed with interrupted Vicryl  however during closure it appeared to be draining pink./open wound back up to flap back off the reinspected the drainage or side and created [place] new exit output less tension I cannot can get off the so this seem to work out well and then I reclose put to flap back on with the Biomet plating system and reclosed daily with Vicryl and skin with staples the and dressed the patient recovered in stable condition. At case on it counts sponge counts were correct the nurses.

## 2012-05-18 NOTE — Anesthesia Postprocedure Evaluation (Signed)
  Anesthesia Post-op Note  Patient: Frederick Knight  Procedure(s) Performed: Procedure(s) (LRB): CRANIOTOMY HEMATOMA EVACUATION SUBDURAL (Right)  Patient Location: PACU  Anesthesia Type: General  Level of Consciousness: awake  Airway and Oxygen Therapy: Patient Spontanous Breathing  Post-op Pain: mild  Post-op Assessment: Post-op Vital signs reviewed, Patient's Cardiovascular Status Stable, Respiratory Function Stable, Patent Airway, No signs of Nausea or vomiting and Pain level controlled  Post-op Vital Signs: stable  Complications: No apparent anesthesia complications

## 2012-05-18 NOTE — Progress Notes (Signed)
Subjective: Patient reports Is feeling better still feels pressure in 7 to move and loss of his body is much improved  Objective: Vital signs in last 24 hours: Temp:  [97.5 F (36.4 C)-98.8 F (37.1 C)] 97.5 F (36.4 C) (06/07 0700) Pulse Rate:  [56-74] 60  (06/07 0700) Resp:  [11-22] 14  (06/07 0700) BP: (110-139)/(52-75) 132/52 mmHg (06/07 0700) SpO2:  [93 %-99 %] 93 % (06/07 0700)  Intake/Output from previous day: 06/06 0701 - 06/07 0700 In: 940 [P.O.:720; IV Piggyback:220] Out: 2515 [Urine:2500; Drains:15] Intake/Output this shift:    Awake alert oriented strength 5 out of 5 no pronator drift  Lab Results:  [facility] 05/16/12 0904  WBC 5.5  HGB 14.8  HCT 43.0  PLT 168   BMET  Basename 05/16/12 0904  NA 138  K 4.5  CL 104  CO2 23  GLUCOSE 157*  BUN 16  CREATININE 0.93  CALCIUM 8.3*    Studies/Results: Ct Head Wo Contrast  05/17/2012  *RADIOLOGY REPORT*  Clinical Data: Follow-up subdural hematoma.  CT HEAD WITHOUT CONTRAST  Technique:  Contiguous axial images were obtained from the base of the skull through the vertex without contrast.  Comparison: 05/16/2012.  Findings: Interval surgical decompression of the right subdural hematoma with a subdural drainage catheter in place.  There is residual fluid and air in the subdural space.  5 mm of right to left shift persists.  No findings for infarction or intraparenchymal hemorrhage.  IMPRESSION: Evacuation of right sided subdural hematoma with residual subdural fluid and air.  Original Report Authenticated By: P. Loralie Champagne, M.D.   Ct Head Wo Contrast  05/16/2012  *RADIOLOGY REPORT*  Clinical Data: Dizziness and weakness for past 2 days.  Fall in March.  CT HEAD WITHOUT CONTRAST  Technique:  Contiguous axial images were obtained from the base of the skull through the vertex without contrast.  Comparison: None.  Findings: Very large slightly loculated right-sided subdural hematoma.  Anterior right frontal region with  maximal thickness of 2.4 cm.  Superior right posterior frontal - parietal region measures up to 2.6 cm.  Marked compression of the adjacent right hemisphere with compression of the right lateral ventricle and midline shift to the left by 10.8 mm.  The slight increased size of the left lateral ventricle may reflect the trapping of the ventricles by the midline shift.  No intracranial mass noted separate from the subdural hematoma.  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  No skull fracture.  IMPRESSION: Large right-sided subdural hematoma with midline shift to the left by 10.8 mm.  Critical Value/emergent results were called by telephone at the time of interpretation on 06/05/2013Please see above.  at 9:25 a.m. to  Dr. Lynelle Doctor, who verbally acknowledged these results.  Original Report Authenticated By: Fuller Canada, M.D.    Assessment/Plan: Followup CT today evaluate the extent of residual fluid in his left frontal area drainage and minimal   LOS: 2 days     Frederick Knight P 05/18/2012, 8:15 AM

## 2012-05-18 NOTE — Progress Notes (Signed)
Patient concerned about CT Scan not being scheduled for today.  RN called Neurology Surgery services spoke with Dr. Phoebe Perch about CT Scan, he asked to wait for Dr. Wynetta Emery to schedule later in the morning.

## 2012-05-18 NOTE — Preoperative (Signed)
Beta Blockers   Reason not to administer Beta Blockers:Not Applicable 

## 2012-05-19 ENCOUNTER — Inpatient Hospital Stay (HOSPITAL_COMMUNITY): Payer: Self-pay

## 2012-05-19 NOTE — Progress Notes (Signed)
Subjective: Patient reports doing well.  Objective: Vital signs in last 24 hours: Temp:  [97.6 F (36.4 C)-99 F (37.2 C)] 97.9 F (36.6 C) (06/08 0400) Pulse Rate:  [55-93] 75  (06/08 0700) Resp:  [8-22] 12  (06/08 0700) BP: (107-156)/(55-88) 110/62 mmHg (06/08 0700) SpO2:  [91 %-100 %] 94 % (06/08 0700) FiO2 (%):  [3 %] 3 % (06/07 2000)  Intake/Output from previous day: 06/07 0701 - 06/08 0700 In: 2720 [P.O.:480; I.V.:1775; IV Piggyback:465] Out: 1435 [Urine:1105; Drains:330] Intake/Output this shift:    Physical Exam: Mild left drift, no c/o HA.  JP 20 cc last shift.  Lab Results:  Basename 05/18/12 2048 05/18/12 1005  WBC 13.7* 8.3  HGB 14.8 15.9  HCT 43.3 46.0  PLT 148* 99*   BMET  Basename 05/18/12 2048 05/18/12 1005  NA 139 138  K 3.5 3.7  CL 102 103  CO2 26 21  GLUCOSE 134* 121*  BUN 10 10  CREATININE 1.00 0.91  CALCIUM 9.0 9.4    Studies/Results: Ct Head Wo Contrast  05/18/2012  *RADIOLOGY REPORT*  Clinical Data: Status post subdural evacuation, evaluate for residual fluid.  CT HEAD WITHOUT CONTRAST  Technique:  Contiguous axial images were obtained from the base of the skull through the vertex without contrast.  Comparison: CT head 05/17/2012.  Preoperative study 05/16/2012.  Findings: The previously identified right subacute subdural hematoma is improved compared with the preoperative study, but unchanged compared with the immediate postoperative study.  As measured on image 24 series 2, residual extra-axial fluid collection measures 16 mm thickness.  Overall attenuation is diminished, near CSF, in the 8-10 HU range.  There is moderate mass effect on the frontal parietal cortex.  Slight pneumocephalus. Surgical drain remains good position via the right parietal burr hole.  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   See comments above. The appearance is unchanged from 05/17/2012.  Original Report Authenticated By: Elsie Stain, M.D.    Assessment/Plan: CT shows persistent anterior frontal blood.  Will continue JP for time being and rescan beginning of coming week to see if hematoma and shift are resolving.    LOS: 3 days    Dorian Heckle, MD 05/19/2012, 8:20 AM

## 2012-05-19 NOTE — Progress Notes (Signed)
RN called Dr. Venetia Maxon Neurosurgery on call, patient continues to drain bloody fluid into charged JP drain. RN reported that since 2100 patient had 195 cc of fluid and increased drainage on surgical dressing.  Patient remains alert and oriented times 4.  No new orders given will continue to monitor.

## 2012-05-20 NOTE — Progress Notes (Signed)
Subjective: Patient reports feeling better with less headache.  Objective: Vital signs in last 24 hours: Temp:  [98.1 F (36.7 C)-99.2 F (37.3 C)] 99 F (37.2 C) (06/09 0000) Pulse Rate:  [67-89] 70  (06/08 2000) Resp:  [10-21] 18  (06/08 2000) BP: (107-140)/(47-77) 130/77 mmHg (06/08 2000) SpO2:  [93 %-98 %] 96 % (06/08 2000)  Intake/Output from previous day: 06/08 0701 - 06/09 0700 In: 2039 [P.O.:884; I.V.:1050; IV Piggyback:105] Out: 2300 [Urine:1970; Drains:330] Intake/Output this shift: Total I/O In: 525 [I.V.:525] Out: 730 [Urine:500; Drains:230]  Physical Exam: Awake, alert, conversant.  No drift.  Improved headache.  Lab Results:  Basename 05/18/12 2048 05/18/12 1005  WBC 13.7* 8.3  HGB 14.8 15.9  HCT 43.3 46.0  PLT 148* 99*   BMET  Basename 05/18/12 2048 05/18/12 1005  NA 139 138  K 3.5 3.7  CL 102 103  CO2 26 21  GLUCOSE 134* 121*  BUN 10 10  CREATININE 1.00 0.91  CALCIUM 9.0 9.4    Studies/Results: Ct Head Wo Contrast  05/19/2012  *RADIOLOGY REPORT*  Clinical Data: Subdural hematoma.  CT HEAD WITHOUT CONTRAST  Technique:  Contiguous axial images were obtained from the base of the skull through the vertex without contrast.  Comparison: 05/18/2012.  Findings: The patient is now status post right frontal craniotomy. There is a residual extra-axial fluid collection with high-density material suggesting interval acute hemorrhage.  Pneumocephalus is again noted.  A drainage catheter is in place.  There is local effacement of the sulci.  Midline shift at the foramen of Monro has decreased from 5.5 mm to 4.3 mm.  There is slight asymmetry of the effacement of the right lateral ventricle.  No parenchymal hemorrhage or definite subarachnoid hemorrhages evident.  The collection appears less prominent over the convexity.  There are some layering blood products as well.  The paranasal sinuses and mastoid air cells are clear.  IMPRESSION:  1.  Status post right frontal  craniotomy. 2.  A residual extra-axial fluid collection demonstrates high density blood products compatible with interval hemorrhage.  The size of the collection is similar anterior and lateral to the anterior frontal lobe.  It is decreased over the convexity. 3.  Layering blood products are noted as well. 4.  Drainage catheter is in place.  Pneumocephalus is noted. 5.  Slight decrease in midline shift.  Original Report Authenticated By: Jamesetta Orleans. MATTERN, M.D.   Ct Head Wo Contrast  05/18/2012  *RADIOLOGY REPORT*  Clinical Data: Status post subdural evacuation, evaluate for residual fluid.  CT HEAD WITHOUT CONTRAST  Technique:  Contiguous axial images were obtained from the base of the skull through the vertex without contrast.  Comparison: CT head 05/17/2012.  Preoperative study 05/16/2012.  Findings: The previously identified right subacute subdural hematoma is improved compared with the preoperative study, but unchanged compared with the immediate postoperative study.  As measured on image 24 series 2, residual extra-axial fluid collection measures 16 mm thickness.  Overall attenuation is diminished, near CSF, in the 8-10 HU range.  There is moderate mass effect on the frontal parietal cortex.  Slight pneumocephalus. Surgical drain remains good position via the right parietal burr hole.  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  See comments above. The appearance is unchanged from 05/17/2012.  Original Report Authenticated By: Elsie Stain, M.D.    Assessment/Plan: Bloody CSF drainage at approx 60  cc last shift.  Will repeat head CT in am to reassess blood collection.    LOS: 4 days    Dorian Heckle, MD 05/20/2012, 4:56 AM

## 2012-05-21 ENCOUNTER — Inpatient Hospital Stay (HOSPITAL_COMMUNITY): Payer: Self-pay

## 2012-05-21 ENCOUNTER — Encounter (HOSPITAL_COMMUNITY): Payer: Self-pay | Admitting: Neurosurgery

## 2012-05-21 MED ORDER — PANTOPRAZOLE SODIUM 40 MG PO TBEC
40.0000 mg | DELAYED_RELEASE_TABLET | Freq: Every day | ORAL | Status: DC
  Administered 2012-05-21 – 2012-05-29 (×9): 40 mg via ORAL
  Filled 2012-05-21 (×5): qty 1

## 2012-05-21 MED ORDER — ALUM & MAG HYDROXIDE-SIMETH 200-200-20 MG/5ML PO SUSP
30.0000 mL | ORAL | Status: DC | PRN
  Administered 2012-05-21 – 2012-05-26 (×2): 30 mL via ORAL
  Filled 2012-05-21 (×2): qty 30

## 2012-05-21 NOTE — Progress Notes (Signed)
Subjective: Patient reports That he feels well less pressure and his head or nausea no vomiting tolerating regular diet  Objective: Vital signs in last 24 hours: Temp:  [97.8 F (36.6 C)-98.7 F (37.1 C)] 97.8 F (36.6 C) (06/10 0802) Pulse Rate:  [68-89] 73  (06/10 0800) Resp:  [12-22] 17  (06/10 0800) BP: (106-140)/(52-78) 129/70 mmHg (06/10 0700) SpO2:  [92 %-97 %] 97 % (06/10 0800) Weight:  [78.9 kg (173 lb 15.1 oz)] 78.9 kg (173 lb 15.1 oz) (06/10 0600)  Intake/Output from previous day: 06/09 0701 - 06/10 0700 In: 3161.5 [P.O.:1604; I.V.:1452.5; IV Piggyback:105] Out: 3245 [Urine:2725; Drains:520] Intake/Output this shift: Total I/O In: 75 [I.V.:75] Out: -   Awake alert oriented strength out of 5 no pronator drift  Lab Results:  Basename 05/18/12 2048 05/18/12 1005  WBC 13.7* 8.3  HGB 14.8 15.9  HCT 43.3 46.0  PLT 148* 99*   BMET  Basename 05/18/12 2048 05/18/12 1005  NA 139 138  K 3.5 3.7  CL 102 103  CO2 26 21  GLUCOSE 134* 121*  BUN 10 10  CREATININE 1.00 0.91  CALCIUM 9.0 9.4    Studies/Results: Ct Head Wo Contrast  05/21/2012  *RADIOLOGY REPORT*  Clinical Data: Followup subdural hematoma drainage.  No clinical changes.  CT HEAD WITHOUT CONTRAST  Technique:  Contiguous axial images were obtained from the base of the skull through the vertex without contrast.  Comparison: 05/19/2012.  Findings: Post right frontal craniotomy for drainage of right-sided subdural hematoma with drain remaining in place.  The mid to anterior right frontal residual subdural hematoma with pneumocephalus has decreased in size.  Maximal thickness 12 mm and is without significant change.  The decrease in size is best appreciated in the anterior right frontal region.  Posterior-superior right frontal - parietal subdural hematoma has increased in size of now measuring 18.6 mm versus prior 11.6 mm. Increase in mass effect upon adjacent brain  Compression of the right lateral ventricle with  midline shift to the left by 4.3 mm without significant change.  No evidence of acute thrombotic infarct.  No intracranial mass lesion noted on this unenhanced exam.  Small vessel disease type changes.  IMPRESSION: Anterior aspect of the right-sided subdural hematoma has decreased in size however, posterior-superior aspect of the right-sided subdural hematoma has increased in size as detailed above.  Critical Value/emergent results were called by telephone at the time of interpretation on 05/21/2012  at 7:14 a.m.  to  [facility] patient's nurse., who verbally acknowledged these results.  Original Report Authenticated By: Fuller Canada, M.D.    Assessment/Plan: Followup CT scan shows a significant improvement from 2 days ago we'll continue we J-P in place with a do some additional drainage consider DC tomorrow next her with followup CT scan Wednesday. Must maintain the ICU for now a subdural drains in place.  LOS: 5 days     Danyel Tobey P 05/21/2012, 8:21 AM

## 2012-05-22 MED ORDER — HYDROCODONE-ACETAMINOPHEN 5-325 MG PO TABS
1.0000 | ORAL_TABLET | ORAL | Status: DC | PRN
  Administered 2012-05-22: 2 via ORAL
  Administered 2012-05-22: 1 via ORAL
  Administered 2012-05-23 – 2012-05-26 (×15): 2 via ORAL
  Administered 2012-05-27: 1 via ORAL
  Administered 2012-05-27: 2 via ORAL
  Administered 2012-05-27 – 2012-05-28 (×4): 1 via ORAL
  Administered 2012-05-28: 2 via ORAL
  Administered 2012-05-28: 1 via ORAL
  Administered 2012-05-28 – 2012-05-29 (×2): 2 via ORAL
  Administered 2012-05-29: 1 via ORAL
  Filled 2012-05-22 (×28): qty 2

## 2012-05-22 MED ORDER — LEVETIRACETAM 500 MG PO TABS
500.0000 mg | ORAL_TABLET | Freq: Two times a day (BID) | ORAL | Status: DC
  Administered 2012-05-22 – 2012-05-29 (×14): 500 mg via ORAL
  Filled 2012-05-22 (×15): qty 1

## 2012-05-23 MED ORDER — MAGNESIUM HYDROXIDE 400 MG/5ML PO SUSP
30.0000 mL | Freq: Every day | ORAL | Status: DC | PRN
  Administered 2012-05-24 – 2012-05-27 (×2): 30 mL via ORAL
  Filled 2012-05-23 (×2): qty 30

## 2012-05-23 MED ORDER — SENNOSIDES-DOCUSATE SODIUM 8.6-50 MG PO TABS
1.0000 | ORAL_TABLET | Freq: Every day | ORAL | Status: DC | PRN
  Administered 2012-05-23 – 2012-05-28 (×3): 1 via ORAL
  Filled 2012-05-23 (×3): qty 1

## 2012-05-23 MED ORDER — FLEET ENEMA 7-19 GM/118ML RE ENEM
1.0000 | ENEMA | Freq: Every day | RECTAL | Status: DC | PRN
  Administered 2012-05-25: 1 via RECTAL
  Filled 2012-05-23: qty 1

## 2012-05-23 NOTE — Progress Notes (Signed)
Patient ID: Frederick Knight, male   DOB: 1951/07/04, 62 y.o.   MRN: [id number] Pt looks great today. No headache, no N/T/W. Incision CDI. MAE x4. I removed the JP today. Head CT tomorrow.

## 2012-05-24 ENCOUNTER — Inpatient Hospital Stay (HOSPITAL_COMMUNITY): Payer: Self-pay

## 2012-05-24 NOTE — Evaluation (Signed)
Occupational Therapy Evaluation Patient Details Name: Frederick Knight MRN: [id number] DOB: October 02, 1951 Today's Date: 05/24/2012 Time: 8295-6213 OT Time Calculation (min): 15 min  OT Assessment / Plan / Recommendation Clinical Impression  Pt presents with SDH s/p right frontal craniotomy. Pts mobility limited secondary to decreased BP upon sitting (see PT for numbers). Pt presents with pain, decreased BP/dizziness, generalized weakness and overall decrease I with ADL. Pt will benefit from skilled OT in the acute setting to maximize I with ADL and ADL mobility prior to d/c     OT Assessment  Patient needs continued OT Services    Follow Up Recommendations  Home health OT (vs. nothing)    Barriers to Discharge      Equipment Recommendations   (TBD)    Recommendations for Other Services    Frequency  Min 2X/week    Precautions / Restrictions Precautions Precautions:  (crani) Restrictions Weight Bearing Restrictions: No   Pertinent Vitals/Pain Pt reports inc pain/pressure in head during activity. Repositioned and RN informed    ADL  Grooming: Performed;Wash/dry face;Set up Where Assessed - Grooming: Unsupported sitting Transfers/Ambulation Related to ADLs: NT this session secondary to dizziness and low BP ADL Comments: limited eval    OT Diagnosis: Generalized weakness;Acute pain  OT Problem List: Decreased activity tolerance;Impaired balance (sitting and/or standing);Decreased knowledge of precautions;Cardiopulmonary status limiting activity;Pain;Increased edema OT Treatment Interventions: Self-care/ADL training;Therapeutic activities;Patient/family education;Balance training   OT Goals Acute Rehab OT Goals OT Goal Formulation: With patient Time For Goal Achievement: 05/24/12 Potential to Achieve Goals: Good ADL Goals Pt Will Perform Grooming: Standing at sink;with supervision ADL Goal: Grooming - Progress: Goal set today Pt Will Perform Upper Body Dressing: Sitting,  chair;Sitting, bed;with set-up ADL Goal: Upper Body Dressing - Progress: Goal set today Pt Will Perform Lower Body Dressing: Sit to stand from bed;Sit to stand from chair;with supervision ADL Goal: Lower Body Dressing - Progress: Goal set today Pt Will Transfer to Toilet: with supervision;Ambulation ADL Goal: Toilet Transfer - Progress: Goal set today Pt Will Perform Toileting - Clothing Manipulation: with supervision;Standing ADL Goal: Toileting - Clothing Manipulation - Progress: Goal set today Additional ADL Goal #1: Pt will participate in further goal setting once able to increase activity level. ADL Goal: Additional Goal #1 - Progress: Goal set today  Visit Information  Last OT Received On: 05/24/12 Assistance Needed: +1 PT/OT Co-Evaluation/Treatment: Yes    Subjective Data  Subjective: My head is hurting Patient Stated Goal: Return home with family   Prior Functioning  Home Living Lives With: Spouse;Family Available Help at Discharge: Family;Available 24 hours/day Type of Home: House Home Access: Stairs to enter [employer] of Steps: 2 Entrance Stairs-Rails: None Home Layout: One level Bathroom Shower/Tub: Forensic scientist: Handicapped height Bathroom Accessibility: Yes How Accessible: Accessible via walker Home Adaptive Equipment: None Prior Function Level of Independence: Independent Able to Take Stairs?: Yes Driving: Yes 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 difficulties Dominant Hand: Right    Cognition  Overall Cognitive Status: Appears within functional limits for tasks assessed/performed Arousal/Alertness: Awake/alert Orientation Level: Appears intact for tasks assessed Behavior During Session: [name] for tasks performed    Extremity/Trunk Assessment Right Upper Extremity Assessment RUE ROM/Strength/Tone: Within functional levels RUE Sensation: WFL - Light Touch RUE  Coordination: WFL - gross/fine motor Left Upper Extremity Assessment LUE ROM/Strength/Tone: Within functional levels LUE Sensation: WFL - Light Touch LUE Coordination: WFL - gross/fine motor   Mobility     Exercise  Balance    End of Session  Pt left in bed with call bell within reach   [name] 05/24/2012, 3:38 PM

## 2012-05-24 NOTE — Progress Notes (Signed)
Patient ID: Frederick Knight, male   DOB: [birth year], 61 y.o.   MRN: [id number] Subjective: Patient reports intermittent H/A. Generalized weakness with drop in BP during PT today.  Objective: Vital signs in last 24 hours: Temp:  [97.3 F (36.3 C)-98.4 F (36.9 C)] 97.5 F (36.4 C) (06/13 0700) Pulse Rate:  [51-84] 81  (06/13 1100) Resp:  [12-19] 15  (06/13 1100) BP: (114-145)/(64-86) 127/79 mmHg (06/13 1100) SpO2:  [92 %-100 %] 93 % (06/13 1100)  Intake/Output from previous day: 06/12 0701 - 06/13 0700 In: 870 [P.O.:120; I.V.:750] Out: 1695 [Urine:1475; Drains:220] Intake/Output this shift: Total I/O In: 240 [P.O.:240] Out: -   Neurologic: Grossly normal, incision CDI, No drift, awake/ alert  Lab Results: Lab Results  Component Value Date   WBC 13.7* 05/18/2012   HGB 14.8 05/18/2012   HCT 43.3 05/18/2012   MCV 88.0 05/18/2012   PLT 148* 05/18/2012   Lab Results  Component Value Date   INR 0.97 05/18/2012   BMET Lab Results  Component Value Date   NA 139 05/18/2012   K 3.5 05/18/2012   CL 102 05/18/2012   CO2 26 05/18/2012   GLUCOSE 134* 05/18/2012   BUN 10 05/18/2012   CREATININE 1.00 05/18/2012   CALCIUM 9.0 05/18/2012    Studies/Results: Ct Head Wo Contrast  05/24/2012  *RADIOLOGY REPORT*  Clinical Data: Follow-up subdural hematoma.  CT HEAD WITHOUT CONTRAST  Technique:  Contiguous axial images were obtained from the base of the skull through the vertex without contrast.  Comparison: 05/21/2012  Findings: Right frontal craniotomy for subdural hematoma drainage. Subdural drain has been removed since the prior study.  Mild to moderate pneumocephalus has increased in the interval.  High density subdural hemorrhage in the right frontal region measures 10 mm and is similar to the prior study.  Intermediate to low density subdural fluid over the convexity on the right is unchanged.  4 mm midline shift is unchanged with mass effect on the right cerebral hemisphere.  No acute infarct.  No new areas  of hemorrhage.  IMPRESSION: Mixed density subdural hematoma on the right is similar to the prior study.  Subdural drain has been removed.  4 mm midline shift is unchanged.  Increase in pneumocephalus.  Original Report Authenticated By: Camelia Phenes, M.D.    Assessment/Plan: CT stable, clinically doing as expected. To floor today.   LOS: 8 days    Jennylee Uehara S 05/24/2012, 11:39 AM

## 2012-05-24 NOTE — Evaluation (Signed)
Physical Therapy Evaluation Patient Details Name: Frederick Knight MRN: [id number] DOB: 03/16/1951 Today's Date: 05/24/2012 Time: 5621-3086 PT Time Calculation (min): 15 min  PT Assessment / Plan / Recommendation Clinical Impression  Pt presents with SDH s/p right frontal craniotomy. Pts mobility limited secondary to decreased BP upon sitting, see numbes in vitals. Pt will benefit from skilled PT in the acute care setting in order to maximize functional mobility and safety prior to d/c    PT Assessment  Patient needs continued PT services    Follow Up Recommendations  Home health PT;Supervision/Assistance - 24 hour (HHPT pending progress)       lEquipment Recommendations  Other (comment) (TBD)       Frequency Min 4X/week    Precautions / Restrictions Precautions Precautions: Other (comment) (crani) Restrictions Weight Bearing Restrictions: No         Mobility  Bed Mobility Bed Mobility: Supine to Sit;Sitting - Scoot to Edge of Bed Supine to Sit: 4: Min assist Sitting - Scoot to Edge of Bed: 5: Supervision Sit to Supine: 4: Min guard Details for Bed Mobility Assistance: VC for proper sequencing and hand placement. Min assist with trunk stability into sitting Transfers Transfers: Not assessed (due to BP) Ambulation/Gait Ambulation/Gait Assistance: Not tested (comment)    Exercises     PT Diagnosis: Acute pain;Difficulty walking  PT Problem List: Decreased mobility;Decreased knowledge of use of DME;Decreased safety awareness;Decreased knowledge of precautions;Pain PT Treatment Interventions: DME instruction;Gait training;Stair training;Functional mobility training;Therapeutic activities;Therapeutic exercise;Patient/family education   PT Goals Acute Rehab PT Goals PT Goal Formulation: With patient Time For Goal Achievement: 05/24/12 Potential to Achieve Goals: Good Pt will go Supine/Side to Sit: with modified independence PT Goal: Supine/Side to Sit - Progress: Goal  set today Pt will go Sit to Supine/Side: with modified independence PT Goal: Sit to Supine/Side - Progress: Goal set today Pt will go Sit to Stand: with modified independence PT Goal: Sit to Stand - Progress: Goal set today Pt will go Stand to Sit: with modified independence PT Goal: Stand to Sit - Progress: Goal set today Pt will Transfer Bed to Chair/Chair to Bed: with supervision PT Transfer Goal: Bed to Chair/Chair to Bed - Progress: Goal set today Pt will Ambulate: >150 feet;with supervision;with least restrictive assistive device PT Goal: Ambulate - Progress: Goal set today Pt will Go Up / Down Stairs: 1-2 stairs;with min assist PT Goal: Up/Down Stairs - Progress: Goal set today  Visit Information  Last PT Received On: 05/24/12 Assistance Needed: +1 PT/OT Co-Evaluation/Treatment: Yes    Subjective Data      Prior Functioning  Home Living Lives With: Spouse;Family Available Help at Discharge: Family;Available 24 hours/day Type of Home: House Home Access: Stairs to enter [employer] of Steps: 2 Entrance Stairs-Rails: None Home Layout: One level Bathroom Shower/Tub: Forensic scientist: Handicapped height Bathroom Accessibility: Yes How Accessible: Accessible via walker Home Adaptive Equipment: None Prior Function Level of Independence: Independent Able to Take Stairs?: Yes Driving: Yes 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 difficulties Dominant Hand: Right    Cognition  Overall Cognitive Status: Appears within functional limits for tasks assessed/performed Arousal/Alertness: Awake/alert Orientation Level: Appears intact for tasks assessed Behavior During Session: [facility] for tasks performed    Extremity/Trunk Assessment Right Lower Extremity Assessment RLE ROM/Strength/Tone: Within functional levels RLE Sensation: WFL - Light Touch Left Lower Extremity Assessment LLE  ROM/Strength/Tone: Within functional levels LLE Sensation: WFL - Light Touch   Balance  End of Session PT - End of Session Activity Tolerance: Treatment limited secondary to medical complications (Comment) Patient left: in bed;with call bell/phone within reach Nurse Communication: Mobility status;Other (comment) (BP)   Milana Kidney 05/24/2012, 12:21 PM  05/24/2012 Milana Kidney DPT PAGER: [phone] OFFICE: [phone]

## 2012-05-25 ENCOUNTER — Inpatient Hospital Stay (HOSPITAL_COMMUNITY): Payer: Self-pay

## 2012-05-25 NOTE — Progress Notes (Signed)
Subjective: Patient reports Still a little bit weak therapy headache is improved  Objective: Vital signs in last 24 hours: Temp:  [97.9 F (36.6 C)-98.1 F (36.7 C)] 98.1 F (36.7 C) (06/14 0955) Pulse Rate:  [75-89] 80  (06/14 0955) Resp:  [18] 18  (06/14 0955) BP: (100-129)/(62-75) 100/68 mmHg (06/14 0955) SpO2:  [92 %-97 %] 97 % (06/14 0955)  Intake/Output from previous day: 06/13 0701 - 06/14 0700 In: 440 [P.O.:440] Out: 375 [Urine:375] Intake/Output this shift:    Strength 5 out of 5 no pronator drift wound clean and dry  Lab Results: No results found for this basename: WBC:2,HGB:2,HCT:2,PLT:2 in the last 72 hours BMET No results found for this basename: NA:2,K:2,CL:2,CO2:2,GLUCOSE:2,BUN:2,CREATININE:2,CALCIUM:2 in the last 72 hours  Studies/Results: Ct Head Wo Contrast  05/24/2012  *RADIOLOGY REPORT*  Clinical Data: Follow-up subdural hematoma.  CT HEAD WITHOUT CONTRAST  Technique:  Contiguous axial images were obtained from the base of the skull through the vertex without contrast.  Comparison: 05/21/2012  Findings: Right frontal craniotomy for subdural hematoma drainage. Subdural drain has been removed since the prior study.  Mild to moderate pneumocephalus has increased in the interval.  High density subdural hemorrhage in the right frontal region measures 10 mm and is similar to the prior study.  Intermediate to low density subdural fluid over the convexity on the right is unchanged.  4 mm midline shift is unchanged with mass effect on the right cerebral hemisphere.  No acute infarct.  No new areas of hemorrhage.  IMPRESSION: Mixed density subdural hematoma on the right is similar to the prior study.  Subdural drain has been removed.  4 mm midline shift is unchanged.  Increase in pneumocephalus.  Original Report Authenticated By: Camelia Phenes, M.D.    Assessment/Plan: Continue physical outpatient therapy repeat CT in the morning  LOS: 9 days     Frederick Knight  P 05/25/2012, 1:17 PM

## 2012-05-25 NOTE — Progress Notes (Signed)
Physical Therapy Treatment Patient Details Name: Frederick Knight MRN: [id number] DOB: 10/24/1951 Today's Date: 05/25/2012 Time: 1191-4782 PT Time Calculation (min): 18 min  PT Assessment / Plan / Recommendation Comments on Treatment Session  Pt progressing well today with ambulation and all mobility. Pt still limited due to fatigue with increased ambulation distances. Will attempt increased ambulation and stairs next session.    Follow Up Recommendations  Home health PT;Supervision/Assistance - 24 hour       Equipment Recommendations  Other (comment) (TBD)       Frequency Min 4X/week   Plan Discharge plan remains appropriate    Precautions / Restrictions Precautions Precautions:  (crani) Restrictions Weight Bearing Restrictions: No       Mobility  Bed Mobility Bed Mobility: Supine to Sit;Sitting - Scoot to Edge of Bed Supine to Sit: 5: Supervision Sitting - Scoot to Edge of Bed: 5: Supervision Details for Bed Mobility Assistance: No physical assist needed. VC for sequencing and safety with head movements. Transfers Transfers: Sit to Stand;Stand to Sit Sit to Stand: 4: Min assist;With upper extremity assist;From bed;From toilet Stand to Sit: 4: Min assist;With upper extremity assist;To chair/3-in-1;To toilet Details for Transfer Assistance: VC for hand placement for safety. Min assist for stability and control on/off toilet.  Ambulation/Gait Ambulation/Gait Assistance: 4: Min assist Ambulation Distance (Feet): 150 Feet Assistive device: 1 person hand held assist Ambulation/Gait Assistance Details: VC for sequencing and postural cues throughout. Hand held assist for stability throughout ambulation. Increased swaying and minimal scisscoring with fatigue Gait Pattern: Step-to pattern;Narrow base of support Gait velocity: decreased gait speed      PT Goals Acute Rehab PT Goals PT Goal Formulation: With patient PT Goal: Supine/Side to Sit - Progress: Progressing toward  goal PT Goal: Sit to Supine/Side - Progress: Progressing toward goal PT Goal: Sit to Stand - Progress: Progressing toward goal PT Goal: Stand to Sit - Progress: Progressing toward goal PT Transfer Goal: Bed to Chair/Chair to Bed - Progress: Progressing toward goal PT Goal: Ambulate - Progress: Progressing toward goal  Visit Information  Last PT Received On: 05/25/12 Assistance Needed: +1       Cognition  Overall Cognitive Status: Appears within functional limits for tasks assessed/performed Arousal/Alertness: Awake/alert Orientation Level: Appears intact for tasks assessed Behavior During Session: [facility] for tasks performed       End of Session PT - End of Session Equipment Utilized During Treatment: Gait belt Activity Tolerance: Patient tolerated treatment well Patient left: in chair;with call bell/phone within reach Nurse Communication: Mobility status    Milana Kidney 05/25/2012, 9:44 AM  05/25/2012 Milana Kidney DPT PAGER: [phone] OFFICE: [phone]

## 2012-05-25 NOTE — Progress Notes (Signed)
Occupational Therapy Treatment Patient Details Name: Frederick Knight MRN: [id number] DOB: Oct 22, 1951 Today's Date: 05/25/2012 Time: 9147-8295 OT Time Calculation (min): 18 min  OT Assessment / Plan / Recommendation Comments on Treatment Session Pt progressing towards goals but fatigues quickly with functional mobility.    Follow Up Recommendations  Home health OT    Barriers to Discharge       Equipment Recommendations       Recommendations for Other Services    Frequency Min 2X/week   Plan Discharge plan remains appropriate    Precautions / Restrictions Precautions Precautions:  (crani) Restrictions Weight Bearing Restrictions: No   Pertinent Vitals/Pain See vitals    ADL  Grooming: Performed;Wash/dry hands;Supervision/safety Where Assessed - Grooming: Unsupported standing Lower Body Bathing: Simulated;Min guard Where Assessed - Lower Body Bathing: Supported sit to stand Lower Body Dressing: Performed;Min guard Where Assessed - Lower Body Dressing: Sopported sit to stand Toilet Transfer: Performed;Minimal assistance Toilet Transfer Method:  (ambulating) Acupuncturist: Regular height toilet Toileting - Clothing Manipulation and Hygiene: Performed;Min guard Where Assessed - Glass blower/designer Manipulation and Hygiene: Standing Equipment Used: Gait belt Transfers/Ambulation Related to ADLs: min assist due to occasional scissoring and swaying. ADL Comments: Educated pt to cross ankles over knees when performing LB bathing/dressing tasks rather than bending over to access feet.    OT Diagnosis:    OT Problem List:   OT Treatment Interventions:     OT Goals ADL Goals Pt Will Perform Grooming: Standing at sink;with supervision ADL Goal: Grooming - Progress: Met Pt Will Perform Lower Body Dressing: Sit to stand from bed;Sit to stand from chair;with supervision ADL Goal: Lower Body Dressing - Progress: Progressing toward goals Pt Will Transfer to Toilet:  with supervision;Ambulation ADL Goal: Toilet Transfer - Progress: Progressing toward goals Pt Will Perform Toileting - Clothing Manipulation: with supervision;Standing ADL Goal: Toileting - Clothing Manipulation - Progress: Progressing toward goals  Visit Information  Last OT Received On: 05/25/12 Assistance Needed: +1    Subjective Data      Prior Functioning       Cognition  Overall Cognitive Status: Appears within functional limits for tasks assessed/performed Arousal/Alertness: Awake/alert Orientation Level: Appears intact for tasks assessed Behavior During Session: [facility] for tasks performed    Mobility Bed Mobility Bed Mobility: Supine to Sit;Sitting - Scoot to Edge of Bed Supine to Sit: 5: Supervision Sitting - Scoot to Edge of Bed: 5: Supervision Details for Bed Mobility Assistance: No physical assist needed. VC for sequencing and safety with head movements. Transfers Sit to Stand: 4: Min assist;With upper extremity assist;From bed;From toilet Stand to Sit: 4: Min assist;With upper extremity assist;To chair/3-in-1;To toilet Details for Transfer Assistance: VC for hand placement for safety. Min assist for stability and control on/off toilet.    Exercises    Balance    End of Session OT - End of Session Equipment Utilized During Treatment: Gait belt Activity Tolerance: Patient limited by fatigue Patient left: in chair;with call bell/phone within reach Nurse Communication: Mobility status  05/25/2012 Cipriano Mile OTR/L Pager [phone] Office [phone]  Cipriano Mile 05/25/2012, 3:28 PM

## 2012-05-26 NOTE — Progress Notes (Signed)
Physical Therapy Treatment Patient Details Name: Frederick Knight MRN: [id number] DOB: 13-Aug-1951 Today's Date: 05/26/2012 Time: 4098-1191 PT Time Calculation (min): 15 min  PT Assessment / Plan / Recommendation Comments on Treatment Session  Pt progressing well with mobility and stairs. Pt with loss of balance x 3 during ambulation this session. Educated pt on importance of safety during all mobility and supervision/assist to prevent falls.    Follow Up Recommendations  Outpatient PT;Supervision for mobility/OOB       Equipment Recommendations  Rolling walker with 5" wheels       Frequency Min 4X/week   Plan Discharge plan remains appropriate    Precautions / Restrictions Precautions Precautions: Fall Restrictions Weight Bearing Restrictions: No       Mobility  Bed Mobility Bed Mobility: Supine to Sit;Sit to Supine;Sitting - Scoot to Edge of Bed Supine to Sit: 6: Modified independent (Device/Increase time) Sitting - Scoot to Edge of Bed: 6: Modified independent (Device/Increase time) Sit to Supine: 6: Modified independent (Device/Increase time) Transfers Transfers: Sit to Stand;Stand to Sit Sit to Stand: 5: Supervision;With upper extremity assist;From bed Stand to Sit: 5: Supervision;With upper extremity assist;To bed Details for Transfer Assistance: VC for hand placement. Supervision due to fall risk and decreased balance during mobility. Ambulation/Gait Ambulation/Gait Assistance: 4: Min guard;4: Min Environmental consultant (Feet): 200 Feet Assistive device: None Ambulation/Gait Assistance Details: Minguard for majority of ambulation requiring min assist at times due to loss of balance x 3 throughout gait. Recommended using RW when not in therapy for safety. VC for safety throughout Gait Pattern: Step-to pattern;Narrow base of support Gait velocity: decreased gait speed Stairs: Yes Stairs Assistance: 5: Supervision Stairs Assistance Details (indicate cue type and  reason): VC for proper sequencing. Supervision for safety throughout Stair Management Technique: Forwards;Step to pattern;No rails Number of Stairs: 5       PT Goals Acute Rehab PT Goals PT Goal: Supine/Side to Sit - Progress: Met PT Goal: Sit to Supine/Side - Progress: Met PT Goal: Sit to Stand - Progress: Progressing toward goal PT Goal: Stand to Sit - Progress: Progressing toward goal PT Transfer Goal: Bed to Chair/Chair to Bed - Progress: Progressing toward goal PT Goal: Ambulate - Progress: Progressing toward goal PT Goal: Up/Down Stairs - Progress: Met  Visit Information  Last PT Received On: 05/26/12 Assistance Needed: +1       Cognition  Overall Cognitive Status: Appears within functional limits for tasks assessed/performed Arousal/Alertness: Awake/alert Orientation Level: Appears intact for tasks assessed Behavior During Session: [facility] for tasks performed    Balance  Standardized Balance Assessment Standardized Balance Assessment:  (see gait)  End of Session PT - End of Session Equipment Utilized During Treatment: Gait belt Activity Tolerance: Patient tolerated treatment well Patient left: in bed;with call bell/phone within reach;with family/visitor present Nurse Communication: Mobility status    Milana Kidney 05/26/2012, 1:05 PM  05/26/2012 Milana Kidney DPT PAGER: [phone] OFFICE: [phone]

## 2012-05-26 NOTE — Progress Notes (Signed)
Subjective: Patient reports Patient is doing well minimal headache medicine seems to control it well no nausea or vomiting  Objective: Vital signs in last 24 hours: Temp:  [97.9 F (36.6 C)-98.3 F (36.8 C)] 97.9 F (36.6 C) (06/15 0559) Pulse Rate:  [70-80] 70  (06/15 0559) Resp:  [18-20] 20  (06/15 0559) BP: (100-122)/(62-71) 120/71 mmHg (06/15 0559) SpO2:  [91 %-97 %] 93 % (06/15 0559)  Intake/Output from previous day:   Intake/Output this shift:    Awake alert oriented strength out of 5  Lab Results: No results found for this basename: WBC:2,HGB:2,HCT:2,PLT:2 in the last 72 hours BMET No results found for this basename: NA:2,K:2,CL:2,CO2:2,GLUCOSE:2,BUN:2,CREATININE:2,CALCIUM:2 in the last 72 hours  Studies/Results: Ct Head Wo Contrast  05/25/2012  *RADIOLOGY REPORT*  Clinical Data: Subdural hematoma.  CT HEAD WITHOUT CONTRAST  Technique:  Contiguous axial images were obtained from the base of the skull through the vertex without contrast.  Comparison: Head CT 05/24/2012.  Findings: Postoperative changes of right frontal craniotomy are again noted.  There is again a complex right-sided extra-axial fluid collection with low attenuation components, high attenuation components and gas, consistent with a partially evacuated right- sided subdural hematoma.  This measures up to 1 cm in thickness (unchanged).  This continues to exert some mild mass effect upon the right cerebral hemisphere with the approximately 4 mm of right to left midline shift.  This also results in some mild crowding of the sulci overlying the right temporal and parietal regions (unchanged).  No new acute findings are noted.  The overall amount of pneumocephalus in the right subdural space appears similar to yesterday's examination, although it has slightly redistributed. Visualized paranasal sinuses and mastoids are well pneumatized.  IMPRESSION: 1.  The overall appearance of the head and brain is very similar to  yesterday's examination, as detailed above. There continues to be a 4 mm of right to left midline shift.  No definite new acute findings are noted.  Original Report Authenticated By: Florencia Reasons, M.D.    Assessment/Plan: Repeated CT stable continue physical outpatient therapy with the discharge early in the week  LOS: 10 days     Ranette Luckadoo P 05/26/2012, 8:34 AM

## 2012-05-27 NOTE — Progress Notes (Signed)
Subjective: Patient reports He is feeling better today less headache more and told his equilibrium  Objective: Vital signs in last 24 hours: Temp:  [97 F (36.1 C)-98 F (36.7 C)] 98 F (36.7 C) (06/16 0520) Pulse Rate:  [69-79] 69  (06/16 0520) Resp:  [18-20] 18  (06/16 0520) BP: (106-121)/(66-78) 106/67 mmHg (06/16 0520) SpO2:  [92 %-96 %] 96 % (06/16 0520)  Intake/Output from previous day: 06/15 0701 - 06/16 0700 In: 240 [P.O.:240] Out: -  Intake/Output this shift:    Awake alert oriented strength out of 5 wound clean and dry plan on followup CT scan tomorrow  Lab Results: No results found for this basename: WBC:2,HGB:2,HCT:2,PLT:2 in the last 72 hours BMET No results found for this basename: NA:2,K:2,CL:2,CO2:2,GLUCOSE:2,BUN:2,CREATININE:2,CALCIUM:2 in the last 72 hours  Studies/Results: Ct Head Wo Contrast  05/25/2012  *RADIOLOGY REPORT*  Clinical Data: Subdural hematoma.  CT HEAD WITHOUT CONTRAST  Technique:  Contiguous axial images were obtained from the base of the skull through the vertex without contrast.  Comparison: Head CT 05/24/2012.  Findings: Postoperative changes of right frontal craniotomy are again noted.  There is again a complex right-sided extra-axial fluid collection with low attenuation components, high attenuation components and gas, consistent with a partially evacuated right- sided subdural hematoma.  This measures up to 1 cm in thickness (unchanged).  This continues to exert some mild mass effect upon the right cerebral hemisphere with the approximately 4 mm of right to left midline shift.  This also results in some mild crowding of the sulci overlying the right temporal and parietal regions (unchanged).  No new acute findings are noted.  The overall amount of pneumocephalus in the right subdural space appears similar to yesterday's examination, although it has slightly redistributed. Visualized paranasal sinuses and mastoids are well pneumatized.   IMPRESSION: 1.  The overall appearance of the head and brain is very similar to yesterday's examination, as detailed above. There continues to be a 4 mm of right to left midline shift.  No definite new acute findings are noted.  Original Report Authenticated By: Florencia Reasons, M.D.    Assessment/Plan: Followup CT MR continue to work on PT OT want to consider rehabilitation placement next   LOS: 11 days     Charlet Harr P 05/27/2012, 9:24 AM

## 2012-05-28 NOTE — Progress Notes (Signed)
Physical Therapy Treatment Patient Details Name: Frederick Knight MRN: [id number] DOB: [birth year] Today's Date: 05/28/2012 Time: 0750-0820 PT Time Calculation (min): 30 min  PT Assessment / Plan / Recommendation Comments on Treatment Session  Pt continues to show great progress.  Pt scored 18/24 on DGI recommending pt use an assistive device at d/c.  Pt currently supervision/mod (I) with all mobility.  Discussed d/c plan with pt and pt currently has no transport to OPPT.  Therefore will change d/c to HHPT.  Updated goals as well.    Follow Up Recommendations  Home health PT    Barriers to Discharge        Equipment Recommendations  Rolling walker with 5" wheels    Recommendations for Other Services    Frequency Min 4X/week   Plan Discharge plan remains appropriate    Precautions / Restrictions Precautions Precautions: Fall Restrictions Weight Bearing Restrictions: No   Pertinent Vitals/Pain No c/o pain    Mobility  Transfers Transfers: Sit to Stand;Stand to Sit Sit to Stand: 6: Modified independent (Device/Increase time) Stand to Sit: 6: Modified independent (Device/Increase time) Ambulation/Gait Ambulation/Gait Assistance: 5: Supervision;4: Min guard Ambulation Distance (Feet): 300 Feet Assistive device: None Ambulation/Gait Assistance Details: Supervision for safety and minguard (A) with higher level balance activities. Gait Pattern: Step-through pattern;Decreased stride length;Shuffle;Narrow base of support Gait velocity: decreased gait speed Stairs: Yes Stairs Assistance: 5: Supervision Stairs Assistance Details (indicate cue type and reason): Supervsion for safety with cues for step sequence Stair Management Technique: Forwards;Step to pattern;No rails Number of Stairs: 5     Exercises     PT Diagnosis:    PT Problem List:   PT Treatment Interventions:     PT Goals Acute Rehab PT Goals PT Goal Formulation: With patient Time For Goal Achievement:  06/04/12 Potential to Achieve Goals: Good Pt will go Supine/Side to Sit: with modified independence PT Goal: Supine/Side to Sit - Progress: Met Pt will go Sit to Supine/Side: with modified independence PT Goal: Sit to Supine/Side - Progress: Met Pt will go Sit to Stand: with modified independence PT Goal: Sit to Stand - Progress: Goal set today Pt will go Stand to Sit: with modified independence PT Goal: Stand to Sit - Progress: Goal set today Pt will Transfer Bed to Chair/Chair to Bed: with modified independence PT Transfer Goal: Bed to Chair/Chair to Bed - Progress: Goal set today Pt will Ambulate: >150 feet;with modified independence;with least restrictive assistive device PT Goal: Ambulate - Progress: Goal set today Pt will Go Up / Down Stairs: 1-2 stairs;with min assist PT Goal: Up/Down Stairs - Progress: Met Additional Goals Additional Goal #1: Pt will improve overall DGI score > 21/24 to reduce fall risk. PT Goal: Additional Goal #1 - Progress: Goal set today  Visit Information  Last PT Received On: 05/28/12 Assistance Needed: +1    Subjective Data      Cognition  Overall Cognitive Status: Appears within functional limits for tasks assessed/performed Arousal/Alertness: Awake/alert Orientation Level: Appears intact for tasks assessed Behavior During Session: [name] for tasks performed    Balance  Balance Balance Assessed: Yes Static Sitting Balance Static Sitting - Balance Support: Feet supported Static Sitting - Level of Assistance: 5: Stand by assistance Static Standing Balance Static Standing - Balance Support: No upper extremity supported Static Standing - Level of Assistance: 5: Stand by assistance Single Leg Stance - Right Leg: 15  Single Leg Stance - Left Leg: 15  Rhomberg - Eyes Opened: 30  Rhomberg - Eyes  Closed: 30  Standardized Balance Assessment Standardized Balance Assessment: Dynamic Gait Index Dynamic Gait Index Level Surface: Normal Change in Gait  Speed: Mild Impairment Gait with Horizontal Head Turns: Mild Impairment Gait with Vertical Head Turns: Mild Impairment Gait and Pivot Turn: Mild Impairment Step Over Obstacle: Mild Impairment Step Around Obstacles: Normal Steps: Mild Impairment Total Score: 18   End of Session PT - End of Session Equipment Utilized During Treatment: Gait belt Activity Tolerance: Patient tolerated treatment well Patient left: in chair;with call bell/phone within reach Nurse Communication: Mobility status    Frederick Knight 05/28/2012, 9:29 AM [name], PT DPT [phone]

## 2012-05-29 MED ORDER — LEVETIRACETAM 500 MG PO TABS: 500.0000 mg | ORAL_TABLET | Freq: Two times a day (BID) | ORAL | Status: DC

## 2012-05-29 MED ORDER — HYDROCODONE-ACETAMINOPHEN 5-325 MG PO TABS: 1.0000 | ORAL_TABLET | ORAL | Status: AC | PRN

## 2012-05-29 NOTE — Discharge Summary (Signed)
  Physician Discharge Summary  Patient ID: Frederick Knight MRN: [id number] DOB/AGE: 1951-04-23 61 y.o.  Admit date: 05/16/2012 Discharge date: 05/29/2012  Admission Diagnoses: Right-sided subacute subdural hematoma  Discharge Diagnoses: Same Active Problems:  * No active hospital problems. *    Discharged Condition: good  Hospital Course: Patient was admitted hospital through the emergency department with a diagnosis of a subacute subdural hematoma placement of the operating room underwent burr hole vacuum patient initially did very well however couple days later followup CT showed persistent loculated fluid collection such a patient back to the OR did a craniotomy for further evacuation of subdural and postoperative for this patient continue to convalesce and progressed well. He was able be transferred to the unit to the floor was working with physical outpatient therapy has significant improvement in his tenderness in his gait and strength discharge home with home health physical therapy and scheduled followup approximately one week for suture removal.  Consults: Significant Diagnostic Studies: Treatments: Burr hole and craniotomy for evacuation of a subacute subdural hematoma Discharge Exam: Blood pressure 120/76, pulse 77, temperature 97.9 F (36.6 C), temperature source Oral, resp. rate 20, height 5\' 10"  (1.778 m), weight 79.1 kg (174 lb 6.1 oz), SpO2 98.00%. Awake alert oriented strength out of 5 wound clean and dry  Disposition: Home   Medication List  As of 05/29/2012 11:34 AM   TAKE these medications         HYDROcodone-acetaminophen 5-325 MG per tablet   Commonly known as: NORCO   Take 1-2 tablets by mouth every 4 (four) hours as needed.      levETIRAcetam 500 MG tablet   Commonly known as: KEPPRA   Take 1 tablet (500 mg total) by mouth 2 (two) times daily.      rosuvastatin 10 MG tablet   Commonly known as: CRESTOR   Take 10 mg by mouth daily.              Signed: Emiliano Welshans P 05/29/2012, 11:34 AM

## 2012-05-29 NOTE — Progress Notes (Signed)
Occupational Therapy Treatment Patient Details Name: Frederick Knight MRN: [id number] DOB: 1951-05-20 Today's Date: 05/29/2012 Time: 7829-5621 OT Time Calculation (min): 12 min  OT Assessment / Plan / Recommendation Comments on Treatment Session Pt progressing well- to d/c home today. Pt educated to keep head covered if washing up and only allow water from shower head from shoulders down.     Follow Up Recommendations  Home health OT    Barriers to Discharge       Equipment Recommendations  Rolling walker with 5" wheels;Tub/shower seat    Recommendations for Other Services    Frequency     Plan Discharge plan remains appropriate    Precautions / Restrictions Precautions Precautions: Fall Restrictions Weight Bearing Restrictions: No   Pertinent Vitals/Pain Pt with no c/o pain during session    ADL  Upper Body Bathing: Simulated;Supervision/safety Where Assessed - Upper Body Bathing: Unsupported standing Lower Body Bathing: Simulated;Supervision/safety Where Assessed - Lower Body Bathing: Unsupported standing Upper Body Dressing: Independent Where Assessed - Upper Body Dressing: Unsupported sitting Lower Body Dressing: Performed;Independent Where Assessed - Lower Body Dressing: Unsupported sit to stand Toilet Transfer: Simulated;Independent Toilet Transfer Method: Sit to Barista: Regular height toilet Toileting - Clothing Manipulation and Hygiene: Simulated;Independent Where Assessed - Toileting Clothing Manipulation and Hygiene: Standing Tub/Shower Transfer: Simulated;Min guard Tub/Shower Transfer Method: Ambulating Equipment Used: Gait belt Transfers/Ambulation Related to ADLs: Supervision throughout room ADL Comments: Educated pt on available DME for home. Pt agreeable to tub/shower seat for home as he is still having trouble with balance at times.    OT Diagnosis:    OT Problem List:   OT Treatment Interventions:     OT Goals ADL  Goals ADL Goal: Lower Body Dressing - Progress: Met ADL Goal: Toilet Transfer - Progress: Met ADL Goal: Toileting - Clothing Manipulation - Progress: Met ADL Goal: Additional Goal #1 - Progress: Met  Visit Information  Last OT Received On: 05/29/12 Assistance Needed: +1    Subjective Data      Prior Functioning       Cognition  Overall Cognitive Status: Appears within functional limits for tasks assessed/performed Arousal/Alertness: Awake/alert Orientation Level: Appears intact for tasks assessed Behavior During Session: [facility] for tasks performed    Mobility Bed Mobility Bed Mobility: Not assessed Supine to Sit: 6: Modified independent (Device/Increase time) Sitting - Scoot to Edge of Bed: 6: Modified independent (Device/Increase time) Sit to Supine: 6: Modified independent (Device/Increase time) Transfers Sit to Stand: 7: Independent;From bed Stand to Sit: 7: Independent;To bed Details for Transfer Assistance: no use of UE   Exercises    Balance Balance Balance Assessed: No  End of Session OT - End of Session Equipment Utilized During Treatment: Gait belt Activity Tolerance: Patient tolerated treatment well Patient left: in bed;with call bell/phone within reach   Keta Vanvalkenburgh 05/29/2012, 3:46 PM

## 2012-05-29 NOTE — Progress Notes (Signed)
Patient walked out of the door without discharge instructions even though discharge paper and prescription has been given. He said he had called for me 3x but was not notified. apologized but he left without the instruction.

## 2012-05-29 NOTE — Discharge Instructions (Signed)
No lifting no bending no twisting no driving a riding a car less discomfort for to see me keep the incision clean and dry

## 2012-05-29 NOTE — Progress Notes (Signed)
Physical Therapy Treatment Patient Details Name: Frederick Knight MRN: [id number] DOB: 1951-08-06 Today's Date: 05/29/2012 Time: 2130-8657 PT Time Calculation (min): 16 min  PT Assessment / Plan / Recommendation Comments on Treatment Session  Patient progressing well with ambulation and balance. Eager to DC home later today.     Follow Up Recommendations  Home health PT    Barriers to Discharge        Equipment Recommendations  Rolling walker with 5" wheels    Recommendations for Other Services    Frequency Min 4X/week   Plan Discharge plan remains appropriate    Precautions / Restrictions Precautions Precautions: Fall   Pertinent Vitals/Pain     Mobility  Bed Mobility Supine to Sit: 6: Modified independent (Device/Increase time) Sitting - Scoot to Edge of Bed: 6: Modified independent (Device/Increase time) Sit to Supine: 6: Modified independent (Device/Increase time) Transfers Sit to Stand: 6: Modified independent (Device/Increase time) Stand to Sit: 6: Modified independent (Device/Increase time) Ambulation/Gait Ambulation/Gait Assistance: 5: Supervision Ambulation Distance (Feet): 500 Feet Assistive device: Rolling walker Ambulation/Gait Assistance Details: Supervision for safety with higher level dynamic gait balance Gait Pattern: Step-through pattern Gait velocity: decreased speed with dynamic actvities Stairs: Yes Stairs Assistance: 5: Supervision Stair Management Technique: No rails;Alternating pattern Number of Stairs: 10     Exercises     PT Diagnosis:    PT Problem List:   PT Treatment Interventions:     PT Goals Acute Rehab PT Goals PT Goal: Supine/Side to Sit - Progress: Met PT Goal: Sit to Supine/Side - Progress: Met PT Goal: Sit to Stand - Progress: Met PT Goal: Stand to Sit - Progress: Met PT Transfer Goal: Bed to Chair/Chair to Bed - Progress: Met PT Goal: Ambulate - Progress: Progressing toward goal PT Goal: Up/Down Stairs - Progress:  Met  Visit Information  Last PT Received On: 05/29/12 Assistance Needed: +1    Subjective Data      Cognition  Overall Cognitive Status: Appears within functional limits for tasks assessed/performed Arousal/Alertness: Awake/alert Orientation Level: Appears intact for tasks assessed Behavior During Session: [facility] for tasks performed    Balance     End of Session PT - End of Session Equipment Utilized During Treatment: Gait belt Activity Tolerance: Patient tolerated treatment well Patient left: in bed;with call bell/phone within reach;with family/visitor present    Frederick Knight 05/29/2012, 2:51 PM 05/29/2012 Frederick Knight PTA [phone] pager [phone] office

## 2012-05-29 NOTE — Progress Notes (Signed)
Subjective: Patient reports Patient's feeling well today Fahrenheit is a therapist morning maybe a little discharge him this afternoon  Objective: Vital signs in last 24 hours: Temp:  [97.8 F (36.6 C)-98.4 F (36.9 C)] 97.9 F (36.6 C) (06/18 0529) Pulse Rate:  [77-82] 77  (06/18 0529) Resp:  [18-20] 20  (06/18 0529) BP: (113-133)/(63-79) 120/76 mmHg (06/18 0529) SpO2:  [95 %-98 %] 98 % (06/18 0529)  Intake/Output from previous day: 06/17 0701 - 06/18 0700 In: 520 [P.O.:520] Out: -  Intake/Output this shift:    Awake alert oriented strength out of 5 no pronator drift  Lab Results: No results found for this basename: WBC:2,HGB:2,HCT:2,PLT:2 in the last 72 hours BMET No results found for this basename: NA:2,K:2,CL:2,CO2:2,GLUCOSE:2,BUN:2,CREATININE:2,CALCIUM:2 in the last 72 hours  Studies/Results: No results found.  Assessment/Plan: Possible discharge later today if cleared by physical therapy  LOS: 13 days     Shellie Rogoff P 05/29/2012, 8:04 AM

## 2012-05-29 NOTE — Care Management Note (Signed)
    Page 1 of 2   05/29/2012     12:52:52 PM   CARE MANAGEMENT NOTE 05/29/2012  Patient:  Frederick Knight, Frederick Knight   Account Number:  [id number]  Date Initiated:  05/29/2012  Documentation initiated by:  Frederick Knight  Subjective/Objective Assessment:   PT WAS ADMITTED WITH HA     Action/Plan:   PROGRESSION OF CARE AND DISCHARGE PLANNING   Anticipated DC Date:  05/29/2012   Anticipated DC Plan:  HOME W HOME HEALTH SERVICES      DC Planning Services  CM consult      Choice offered to / List presented to:  C-1 Patient   DME arranged  [name]      DME agency  [facility]     HH arranged  HH-2 PT  HH-3 OT      [facility] agency  [facility]   Status of service:  Completed, signed off Medicare Important Message given?   (If response is "NO", the following Medicare IM given date fields will be blank) Date Medicare IM given:   Date Additional Medicare IM given:    Discharge Disposition:  HOME W HOME HEALTH SERVICES  Per UR Regulation:  Reviewed for med. necessity/level of care/duration of stay  If discussed at Long Length of Stay Meetings, dates discussed:    Comments:  05/29/12 Frederick Boer, RN, BSN 1250 PT HAS A CRANI AFTER A FALL.  PTA PT WAS AT HOME WITH SELF CARE.  PT IS TO DC TO HOME WITH HH PT/OT/ RW FROM [facility]: [facility] Rhonda W.

## 2012-06-05 ENCOUNTER — Other Ambulatory Visit (HOSPITAL_COMMUNITY): Payer: Self-pay | Admitting: Neurosurgery

## 2012-06-05 ENCOUNTER — Ambulatory Visit (HOSPITAL_COMMUNITY)
Admission: RE | Admit: 2012-06-05 | Discharge: 2012-06-05 | Disposition: A | Discharge status: Home / Self Care | Payer: Self-pay | Source: Ambulatory Visit | Attending: Neurosurgery | Admitting: Neurosurgery

## 2012-06-05 DIAGNOSIS — I6200 Nontraumatic subdural hemorrhage, unspecified: Secondary | ICD-10-CM

## 2012-06-05 DIAGNOSIS — Z09 Encounter for follow-up examination after completed treatment for conditions other than malignant neoplasm: Secondary | ICD-10-CM | POA: Insufficient documentation

## 2012-06-19 ENCOUNTER — Ambulatory Visit (HOSPITAL_COMMUNITY)
Admission: RE | Admit: 2012-06-19 | Discharge: 2012-06-19 | Disposition: A | Discharge status: Home / Self Care | Payer: Self-pay | Source: Ambulatory Visit | Attending: Neurosurgery | Admitting: Neurosurgery

## 2012-06-19 ENCOUNTER — Other Ambulatory Visit (HOSPITAL_COMMUNITY): Payer: Self-pay | Admitting: Neurosurgery

## 2012-06-19 DIAGNOSIS — Z09 Encounter for follow-up examination after completed treatment for conditions other than malignant neoplasm: Secondary | ICD-10-CM | POA: Insufficient documentation

## 2012-06-19 DIAGNOSIS — I6200 Nontraumatic subdural hemorrhage, unspecified: Secondary | ICD-10-CM

## 2012-07-17 ENCOUNTER — Ambulatory Visit (HOSPITAL_COMMUNITY)
Admission: RE | Admit: 2012-07-17 | Discharge: 2012-07-17 | Disposition: A | Discharge status: Home / Self Care | Payer: Self-pay | Source: Ambulatory Visit | Attending: Neurosurgery | Admitting: Neurosurgery

## 2012-07-17 DIAGNOSIS — I6200 Nontraumatic subdural hemorrhage, unspecified: Secondary | ICD-10-CM | POA: Insufficient documentation

## 2012-07-17 DIAGNOSIS — Z09 Encounter for follow-up examination after completed treatment for conditions other than malignant neoplasm: Secondary | ICD-10-CM | POA: Insufficient documentation

## 2012-07-17 DIAGNOSIS — G319 Degenerative disease of nervous system, unspecified: Secondary | ICD-10-CM | POA: Insufficient documentation

## 2012-11-05 ENCOUNTER — Other Ambulatory Visit (HOSPITAL_COMMUNITY): Payer: Self-pay | Admitting: Neurosurgery

## 2012-11-12 ENCOUNTER — Other Ambulatory Visit (HOSPITAL_COMMUNITY): Payer: Self-pay | Admitting: Neurosurgery

## 2012-11-12 DIAGNOSIS — I6200 Nontraumatic subdural hemorrhage, unspecified: Secondary | ICD-10-CM

## 2012-11-16 ENCOUNTER — Ambulatory Visit (HOSPITAL_COMMUNITY)
Admission: RE | Admit: 2012-11-16 | Discharge: 2012-11-16 | Disposition: A | Discharge status: Home / Self Care | Payer: Self-pay | Source: Ambulatory Visit | Attending: Neurosurgery | Admitting: Neurosurgery

## 2012-11-16 DIAGNOSIS — I6200 Nontraumatic subdural hemorrhage, unspecified: Secondary | ICD-10-CM | POA: Insufficient documentation

## 2012-11-16 DIAGNOSIS — R42 Dizziness and giddiness: Secondary | ICD-10-CM | POA: Insufficient documentation

## 2013-01-18 ENCOUNTER — Encounter: Payer: Self-pay | Admitting: Family Medicine

## 2013-01-18 ENCOUNTER — Ambulatory Visit (INDEPENDENT_AMBULATORY_CARE_PROVIDER_SITE_OTHER): Payer: Self-pay | Admitting: Family Medicine

## 2013-01-18 VITALS — BP 163/78 | HR 74 | Ht 68.0 in | Wt 184.0 lb

## 2013-01-18 DIAGNOSIS — I10 Essential (primary) hypertension: Secondary | ICD-10-CM | POA: Insufficient documentation

## 2013-01-18 DIAGNOSIS — E785 Hyperlipidemia, unspecified: Secondary | ICD-10-CM | POA: Insufficient documentation

## 2013-01-18 DIAGNOSIS — N400 Enlarged prostate without lower urinary tract symptoms: Secondary | ICD-10-CM | POA: Insufficient documentation

## 2013-01-18 MED ORDER — ROSUVASTATIN CALCIUM 10 MG PO TABS: 10.0000 mg | ORAL_TABLET | Freq: Every day | ORAL | Status: DC

## 2013-01-18 NOTE — Progress Notes (Signed)
Frederick Knight is a 62 y.o. male who presents today for establishment of care.  Pt has PMHx for Hyperlipidemia, which he has been treated on Crestor for.  Pt compliant with medication and denies myalgias, HA, RUQ pain or hematuria.  Has not had his lipids checked in the past yr.  Pt also c/o some increased urinary frequency without hematuria.  This started about 6-12 months ago and noticed he is going to the bathroom about 1-2 times per night.  He urinates about 5-6 x per day and has not increased his intake.  No hematuria, no BRBPR, no weight loss, no back pain.   Pt BP elevated today on exam, has c/o some increased BP over the last 9 months.  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  Has not been on BP medication since and denies edema, HA, blurred vision, diplopia.   Past Medical History  Diagnosis Date  . Hyperlipemia     History  Smoking status  . Never Smoker   Smokeless tobacco  . Not on file    Current Outpatient Prescriptions on File Prior to Visit  Medication Sig Dispense Refill  . levETIRAcetam (KEPPRA) 500 MG tablet Take 1 tablet (500 mg total) by mouth 2 (two) times daily.  60 tablet  1  . rosuvastatin (CRESTOR) 10 MG tablet Take 1 tablet (10 mg total) by mouth daily.  30 tablet  5    ROS: Per HPI.  All other systems reviewed and are negative.   Physical Exam Filed Vitals:   01/18/13 1425  BP: 163/78  Pulse: 74    Physical Examination: General appearance - alert, well appearing, and in no distress Eyes - pupils equal and reactive, extraocular eye movements intact Neck - supple, no significant adenopathy Chest - clear to auscultation, no wheezes, rales or rhonchi, symmetric air entry Heart - normal rate, regular rhythm, normal S1, S2, no murmurs, rubs, clicks or gallops Rectal - PROSTATE EXAM: enlarged, no nodules palpated Neurological - alert, oriented, normal speech, no focal findings or movement disorder  noted Head: + scarring R parietal lobe from previous craniotomy

## 2013-01-18 NOTE — Assessment & Plan Note (Signed)
Pt having S/Sx of BPH, went over risks and benefits of PSA testing and at this time does not want this test at this time.  Pt expressed understanding.  Will reevaluate in one month and if still having Sx consider PSA testing and Flomax.

## 2013-01-18 NOTE — Patient Instructions (Signed)
Mr. Raudenbush, it was nice meeting you today.  We will see you back in one month and see how you are doing.    Thanks, Dr. Paulina Fusi    Hypertension As your heart beats, it forces blood through your arteries. This force is your blood pressure. If the pressure is too high, it is called hypertension (HTN) or high blood pressure. HTN is dangerous because you may have it and not know it. High blood pressure may mean that your heart has to work harder to pump blood. Your arteries may be narrow or stiff. The extra work puts you at risk for heart disease, stroke, and other problems.  Blood pressure consists of two numbers, a higher number over a lower, 110/72, for example. It is stated as "110 over 72." The ideal is below 120 for the top number (systolic) and under 80 for the bottom (diastolic). Write down your blood pressure today. You should pay close attention to your blood pressure if you have certain conditions such as:  Heart failure.  Prior heart attack.  Diabetes  Chronic kidney disease.  Prior stroke.  Multiple risk factors for heart disease. To see if you have HTN, your blood pressure should be measured while you are seated with your arm held at the level of the heart. It should be measured at least twice. A one-time elevated blood pressure reading (especially in the Emergency Department) does not mean that you need treatment. There may be conditions in which the blood pressure is different between your right and left arms. It is important to see your caregiver soon for a recheck. Most people have essential hypertension which means that there is not a specific cause. This type of high blood pressure may be lowered by changing lifestyle factors such as:  Stress.  Smoking.  Lack of exercise.  Excessive weight.  Drug/tobacco/alcohol use.  Eating less salt. Most people do not have symptoms from high blood pressure until it has caused damage to the body. Effective treatment can often  prevent, delay or reduce that damage. TREATMENT  When a cause has been identified, treatment for high blood pressure is directed at the cause. There are a large number of medications to treat HTN. These fall into several categories, and your caregiver will help you select the medicines that are best for you. Medications may have side effects. You should review side effects with your caregiver. If your blood pressure stays high after you have made lifestyle changes or started on medicines,   Your medication(s) may need to be changed.  Other problems may need to be addressed.  Be certain you understand your prescriptions, and know how and when to take your medicine.  Be sure to follow up with your caregiver within the time frame advised (usually within two weeks) to have your blood pressure rechecked and to review your medications.  If you are taking more than one medicine to lower your blood pressure, make sure you know how and at what times they should be taken. Taking two medicines at the same time can result in blood pressure that is too low. SEEK IMMEDIATE MEDICAL CARE IF:  You develop a severe headache, blurred or changing vision, or confusion.  You have unusual weakness or numbness, or a faint feeling.  You have severe chest or abdominal pain, vomiting, or breathing problems. MAKE SURE YOU:   Understand these instructions.  Will watch your condition.  Will get help right away if you are not doing well or get  worse. Document Released: 11/28/2005 Document Revised: 02/20/2012 Document Reviewed: 07/18/2008 [facility] Patient Information 2013 [place], [place].

## 2013-01-18 NOTE — Assessment & Plan Note (Signed)
Will get Lipid Profile as he has not had one done in over 1+ yr.  Also will refill Crestor today, 10 mg qd.  No myalgias, RUQ pain, weakness, or fatigue.  Will see back in one month.

## 2013-01-18 NOTE — Assessment & Plan Note (Addendum)
Pt with elevated BP to 160s/80 today.  States his BP has been elevated, will go ahead and get CBC (anemia), BMP (kidney fxn), UA (kidney fxn and urinary symptoms), Lipid Profile.  Will see back in one month and start on Zestoretic or Zestril if still elevated.

## 2013-01-22 ENCOUNTER — Other Ambulatory Visit (INDEPENDENT_AMBULATORY_CARE_PROVIDER_SITE_OTHER): Payer: Self-pay

## 2013-01-22 DIAGNOSIS — I10 Essential (primary) hypertension: Secondary | ICD-10-CM

## 2013-01-22 LAB — CBC WITH DIFFERENTIAL/PLATELET
Eosinophils Absolute: 0.2 10*3/uL (ref 0.0–0.7)
Eosinophils Relative: 3 % (ref 0–5)
HCT: 47.8 % (ref 39.0–52.0)
Lymphocytes Relative: 28 % (ref 12–46)
Lymphs Abs: 1.8 10*3/uL (ref 0.7–4.0)
MCH: 30.3 pg (ref 26.0–34.0)
MCV: 86.1 fL (ref 78.0–100.0)
Monocytes Absolute: 0.6 10*3/uL (ref 0.1–1.0)
Platelets: 149 10*3/uL — ABNORMAL LOW (ref 150–400)
RBC: 5.55 MIL/uL (ref 4.22–5.81)
RDW: 13.9 % (ref 11.5–15.5)
WBC: 6.4 10*3/uL (ref 4.0–10.5)

## 2013-01-22 LAB — POCT URINALYSIS DIPSTICK
Bilirubin, UA: NEGATIVE
Blood, UA: NEGATIVE
Glucose, UA: NEGATIVE
Spec Grav, UA: 1.02
pH, UA: 7

## 2013-01-22 LAB — BASIC METABOLIC PANEL
CO2: 27 mEq/L (ref 19–32)
Calcium: 9.4 mg/dL (ref 8.4–10.5)
Creat: 1.11 mg/dL (ref 0.50–1.35)
Sodium: 140 mEq/L (ref 135–145)

## 2013-01-22 LAB — LIPID PANEL
Cholesterol: 148 mg/dL (ref 0–200)
HDL: 43 mg/dL (ref >39)
Total CHOL/HDL Ratio: 3.4 Ratio

## 2013-01-22 NOTE — Progress Notes (Signed)
BMP,CBC WITH DIFF ,FLP AND UA GLUCOSE/PROTEIN DONE TODAY Harmonii Karle

## 2013-01-22 NOTE — Progress Notes (Signed)
Changed urine order from check just for glucose/protein to complete urinalysis based on ov note. Dewitt Hoes, MLS (ASCP)

## 2013-01-23 ENCOUNTER — Encounter: Payer: Self-pay | Admitting: Family Medicine

## 2013-02-26 ENCOUNTER — Ambulatory Visit: Payer: Self-pay | Admitting: Family Medicine

## 2013-03-14 ENCOUNTER — Other Ambulatory Visit: Payer: Self-pay | Admitting: Family Medicine

## 2013-03-14 DIAGNOSIS — E785 Hyperlipidemia, unspecified: Secondary | ICD-10-CM

## 2013-03-14 MED ORDER — ROSUVASTATIN CALCIUM 10 MG PO TABS: 10.0000 mg | ORAL_TABLET | Freq: Every day | ORAL | Status: DC

## 2013-03-19 ENCOUNTER — Ambulatory Visit: Payer: Self-pay | Admitting: Family Medicine

## 2013-03-19 ENCOUNTER — Encounter: Payer: Self-pay | Admitting: Family Medicine

## 2013-03-19 DIAGNOSIS — E785 Hyperlipidemia, unspecified: Secondary | ICD-10-CM | POA: Insufficient documentation

## 2013-05-31 ENCOUNTER — Telehealth: Payer: Self-pay | Admitting: Family Medicine

## 2013-05-31 NOTE — Telephone Encounter (Signed)
Patient is needing to be referred to the dental clinic since he now has the orange card.  He needs to see them for a broken tooth.

## 2013-06-03 NOTE — Telephone Encounter (Signed)
Per Huntley Dec, CMA, she has taken care of this message.  Rain Friedt, Darlyne Russian, CMA

## 2013-06-04 ENCOUNTER — Encounter: Payer: Self-pay | Admitting: Family Medicine

## 2013-06-04 ENCOUNTER — Ambulatory Visit (INDEPENDENT_AMBULATORY_CARE_PROVIDER_SITE_OTHER): Payer: No Typology Code available for payment source | Admitting: Family Medicine

## 2013-06-04 VITALS — BP 132/78 | HR 74 | Ht 68.0 in | Wt 178.0 lb

## 2013-06-04 DIAGNOSIS — I10 Essential (primary) hypertension: Secondary | ICD-10-CM

## 2013-06-04 DIAGNOSIS — N400 Enlarged prostate without lower urinary tract symptoms: Secondary | ICD-10-CM

## 2013-06-04 DIAGNOSIS — E785 Hyperlipidemia, unspecified: Secondary | ICD-10-CM

## 2013-06-04 DIAGNOSIS — L821 Other seborrheic keratosis: Secondary | ICD-10-CM | POA: Insufficient documentation

## 2013-06-04 DIAGNOSIS — R1033 Periumbilical pain: Secondary | ICD-10-CM

## 2013-06-04 LAB — BASIC METABOLIC PANEL
BUN: 15 mg/dL (ref 6–23)
CO2: 27 mEq/L (ref 19–32)
Chloride: 106 mEq/L (ref 96–112)
Creat: 1.11 mg/dL (ref 0.50–1.35)
Glucose, Bld: 93 mg/dL (ref 70–99)
Potassium: 4.1 mEq/L (ref 3.5–5.3)

## 2013-06-04 NOTE — Patient Instructions (Signed)
Frederick Knight, it was nice seeing you today.  For your abdominal discomfort, we will give you the cards which you will send in.  Also, we will have a CT scan to evaluate for any further causes of this pain.  For your itching on your back, please use the hydrocortisone cream.  We will see you back in one month.  Thanks, Dr. Paulina Fusi

## 2013-06-04 NOTE — Assessment & Plan Note (Signed)
BP well controlled today, may have been stress reaction previously.  Will continue to monitor.

## 2013-06-04 NOTE — Addendum Note (Signed)
Addended by: Gildardo Cranker R on: 06/04/2013 10:26 AM   Modules accepted: Orders

## 2013-06-04 NOTE — Assessment & Plan Note (Signed)
Has been ongoing for a couple months now, more of a fullness with trouble controlling bowel and bladder.  No constipation,diarrhea, melena, fevers, weight loss, fatigue.  No insurance at this time but orange card so will do FOBT x 3 along with CT abdomomen/pelvis w/ contrast.  Will see back in one month as well to recheck and decide if needs further imaging.  Will need colonoscopy in future, at this time cannot afford it.

## 2013-06-04 NOTE — Assessment & Plan Note (Signed)
Still having increased frequency but no hematuria, urgency.  No PSA today but can have discussion again in next month if still having the Sx.  Would also consider flomax or cadura.

## 2013-06-04 NOTE — Assessment & Plan Note (Signed)
Lipid Profile in februrary 2014 w/ Cholesterol 148 and LDL at 71.  Continue on Crestor and repeat Lipid Profile in 6 months if he wants to have it done.

## 2013-06-04 NOTE — Progress Notes (Signed)
Frederick Knight is a 62 y.o. male who presents today for abdominal pain, hyperlipidemia, seborrheic keratosis, BPH, and possible tick bite.   HTN - Well controlled on diet alone.  No medications currently, no complaints or concerns.  HLD - On crestor 10 mg qd w/o myalgias. Compliant with medication and last Lipid Profile in Feb 2014.    Seborrheic Keratosis - Has been ongoing for several weeks now and is located in L upper flank near the back.  Extremely pruritic but no rashes or moles a/w this.  Has not tried anything for this as well.  Possible Tick Bite - States he had tick bite around the suprapubic region around 1 month ago but no erythema chronicum migrans, myalgias, fevers, arthralgias, palpitations.  Small scab still present with some pruritis and unsure of how long "tick" was on his body.  Abdominal Pain, suprapubic/BPH - Has been ongoing now for around 2-3 months, described more a fullness worse with activity like walking.  He has trouble controlling his bladder/bowels at time but no fevers, weight loss, fatigue, melena, diarrhea, constipation, night sweats, changes in bowel patterns.  BPH Sx have not changed since last visit as well and still having increased urination w/o hematuria.  His abdominal fullness is located more suprapubic below the umbilicus that does not radiate to either side.    Past Medical History  Diagnosis Date  . Hyperlipemia     History  Smoking status  . Never Smoker   Smokeless tobacco  . Not on file    No family history on file.  Current Outpatient Prescriptions on File Prior to Visit  Medication Sig Dispense Refill  . levETIRAcetam (KEPPRA) 500 MG tablet Take 1 tablet (500 mg total) by mouth 2 (two) times daily.  60 tablet  1  . rosuvastatin (CRESTOR) 10 MG tablet Take 1 tablet (10 mg total) by mouth daily.  30 tablet  5   No current facility-administered medications on file prior to visit.    ROS: Per HPI.  All other systems reviewed and are  negative.   Physical Exam Filed Vitals:   06/04/13 0930  BP: 132/78  Pulse: 74    Physical Examination: General appearance - alert, well appearing, and in no distress Lymphatics - no palpable lymphadenopathy, no hepatosplenomegaly Abdomen - soft, nontender, nondistended, no masses or organomegaly Extremities - no pedal edema noted Skin - + 2 x 2 mm seborrheic keratosis L upper posterior flank     Chemistry      Component Value Date/Time   NA 140 01/22/2013 0835   K 4.2 01/22/2013 0835   CL 103 01/22/2013 0835   CO2 27 01/22/2013 0835   BUN 15 01/22/2013 0835   CREATININE 1.11 01/22/2013 0835   CREATININE 1.00 05/18/2012 2048      Component Value Date/Time   CALCIUM 9.4 01/22/2013 0835   ALKPHOS 76 05/16/2012 0904   AST 36 05/16/2012 0904   ALT 28 05/16/2012 0904   BILITOT 0.3 05/16/2012 0904      Lab Results  Component Value Date   WBC 6.4 01/22/2013   HGB 16.8 01/22/2013   HCT 47.8 01/22/2013   MCV 86.1 01/22/2013   PLT 149* 01/22/2013

## 2013-06-04 NOTE — Assessment & Plan Note (Addendum)
2 by 2 mm on L upper flank.  Causing him some pruritis, recommending hydrocortisone to the area and if no improvement in one month would go ahead and use some liquid nitrogen to alleviate.

## 2013-06-10 ENCOUNTER — Telehealth: Payer: Self-pay | Admitting: Family Medicine

## 2013-06-10 NOTE — Telephone Encounter (Signed)
Patient called stating that the online pharmacy will not fill his medication Crestor 10mg . The online pharmacy said that the [facility] # was wrong or not in their system. Frederick Knight wants [place] to call [phone] or fax [phone] the correct information so he can get his medication filled. JW

## 2013-06-11 ENCOUNTER — Ambulatory Visit (HOSPITAL_COMMUNITY)
Admission: RE | Admit: 2013-06-11 | Discharge: 2013-06-11 | Disposition: A | Discharge status: Home / Self Care | Payer: No Typology Code available for payment source | Source: Ambulatory Visit | Attending: Family Medicine | Admitting: Family Medicine

## 2013-06-11 ENCOUNTER — Encounter (HOSPITAL_COMMUNITY): Payer: Self-pay

## 2013-06-11 DIAGNOSIS — K4090 Unilateral inguinal hernia, without obstruction or gangrene, not specified as recurrent: Secondary | ICD-10-CM | POA: Insufficient documentation

## 2013-06-11 DIAGNOSIS — K7689 Other specified diseases of liver: Secondary | ICD-10-CM | POA: Insufficient documentation

## 2013-06-11 DIAGNOSIS — R1033 Periumbilical pain: Secondary | ICD-10-CM

## 2013-06-11 DIAGNOSIS — R1084 Generalized abdominal pain: Secondary | ICD-10-CM | POA: Insufficient documentation

## 2013-06-11 DIAGNOSIS — Q619 Cystic kidney disease, unspecified: Secondary | ICD-10-CM | POA: Insufficient documentation

## 2013-06-11 MED ORDER — IOHEXOL 300 MG/ML  SOLN
100.0000 mL | Freq: Once | INTRAMUSCULAR | Status: AC | PRN
  Administered 2013-06-11: 100 mL via INTRAVENOUS

## 2013-06-13 ENCOUNTER — Encounter: Payer: Self-pay | Admitting: *Deleted

## 2013-06-13 NOTE — Telephone Encounter (Signed)
This encounter was created in error - please disregard.

## 2013-06-13 NOTE — Telephone Encounter (Signed)
[facility] calling back stating that md info had NOT been updated - given DEA # and prescription processed. Wyatt Haste, RN-BSN

## 2013-06-13 NOTE — Telephone Encounter (Signed)
[facility] called and stated that problem with DEA had been corrected and refill has been processed. Pt called and message left that refill has been submitted and he should be able to pick up when he is ready. Wyatt Haste, RN-BSN

## 2013-06-19 LAB — POC HEMOCCULT BLD/STL (HOME/3-CARD/SCREEN)
Card #2 Fecal Occult Blod, POC: NEGATIVE
Fecal Occult Blood, POC: NEGATIVE

## 2013-06-19 NOTE — Addendum Note (Signed)
Addended by: Jennette Bill on: 06/19/2013 02:35 PM   Modules accepted: Orders

## 2013-07-05 ENCOUNTER — Ambulatory Visit: Payer: No Typology Code available for payment source | Admitting: Family Medicine

## 2013-07-05 IMAGING — CT CT HEAD W/O CM
1 series · 16 of 30 positions shown, 20 images · non-contrast
Comparison: CT head 05/17/2012.  Preoperative study 05/16/2012.

CLINICAL DATA: Status post subdural evacuation, evaluate for
residual fluid.

CT HEAD WITHOUT CONTRAST
TECHNIQUE: Contiguous axial images were obtained from the base of
the skull through the vertex without contrast.

[Series 2: head routine 4.8 h37s · axial · 0.43mm/px · z∈[-134,-1]mm · 16 of 30 slices shown, 20 images]
[im 2/30  brain]
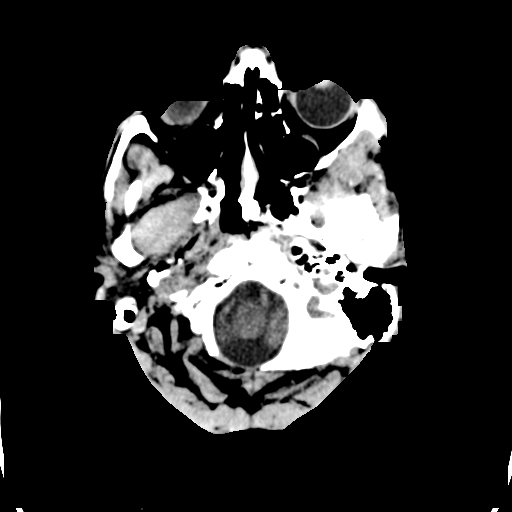
[im 2/30  bone]
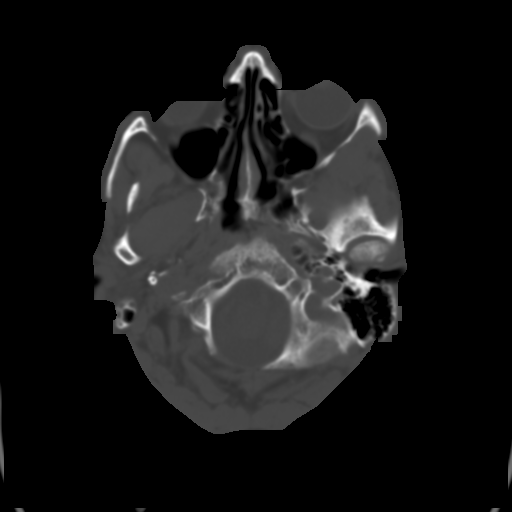
[im 4/30  brain]
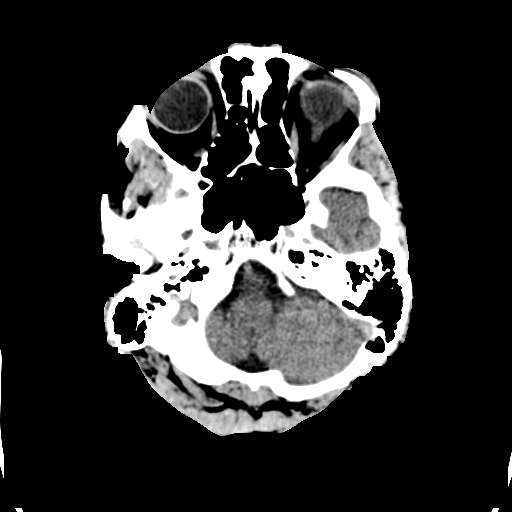
[im 6/30  brain]
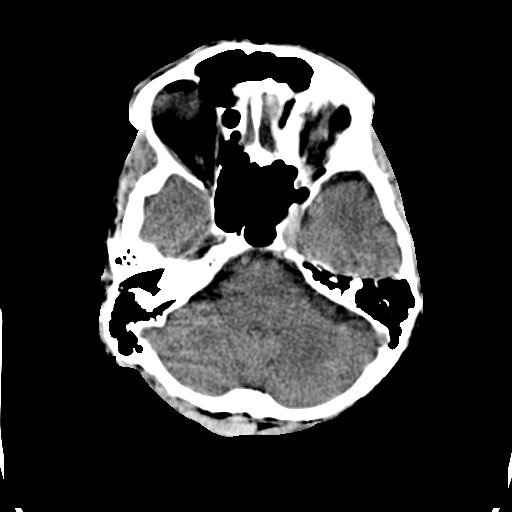
[im 8/30  brain]
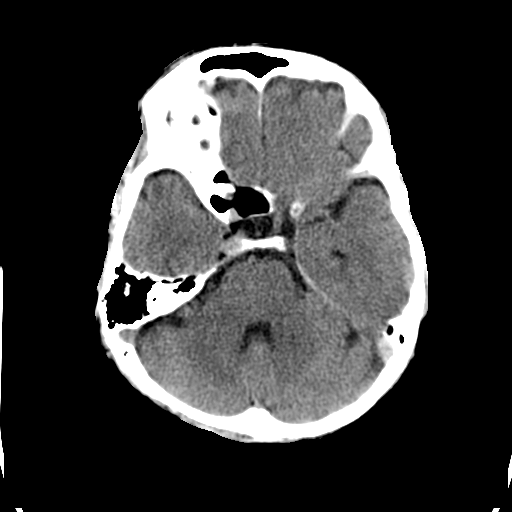
[im 9/30  brain]
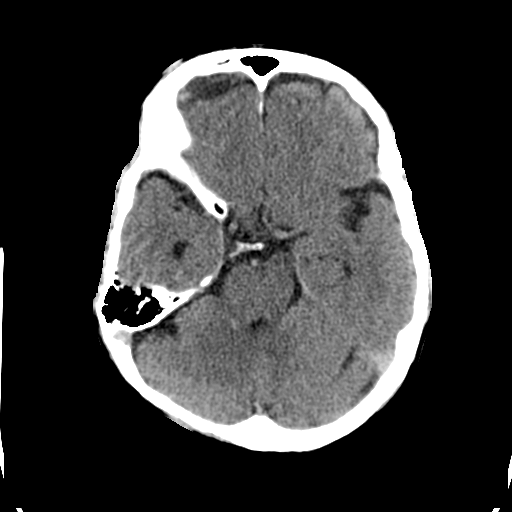
[im 9/30  bone]
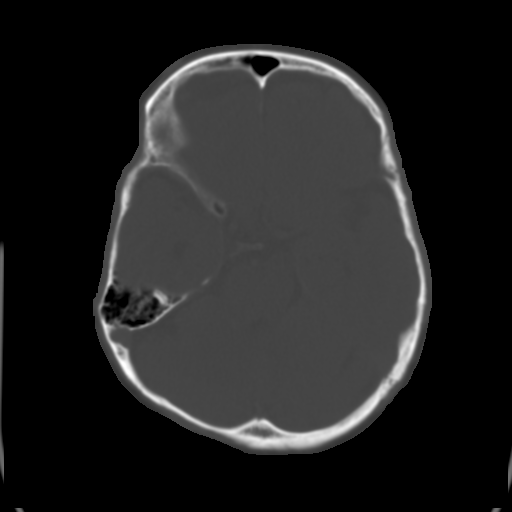
[im 11/30  brain]
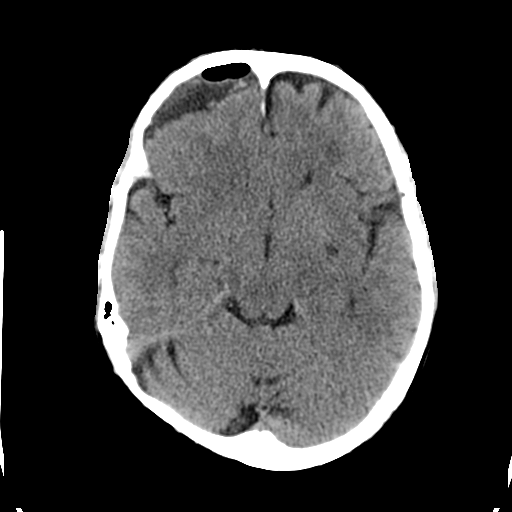
[im 13/30  brain]
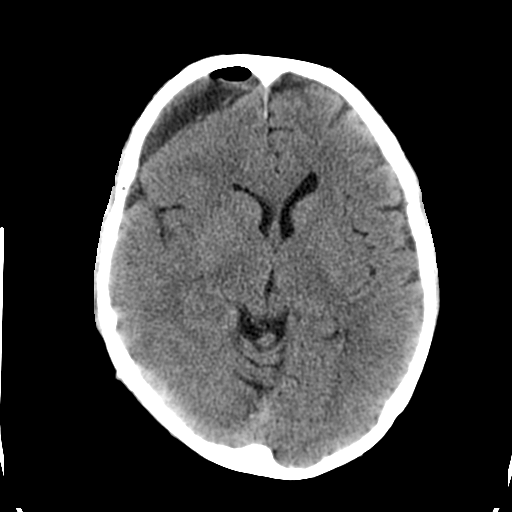
[im 15/30  brain]
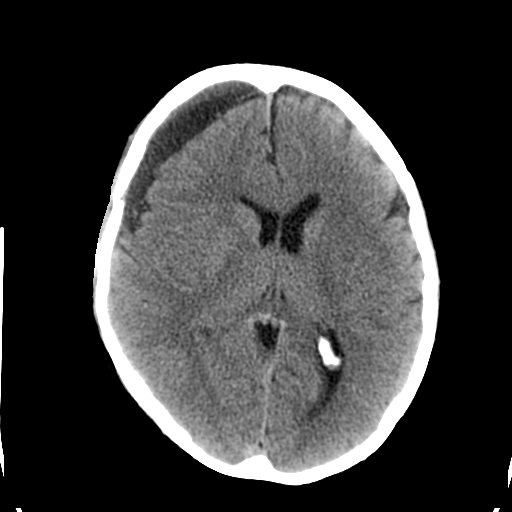
[im 16/30  brain]
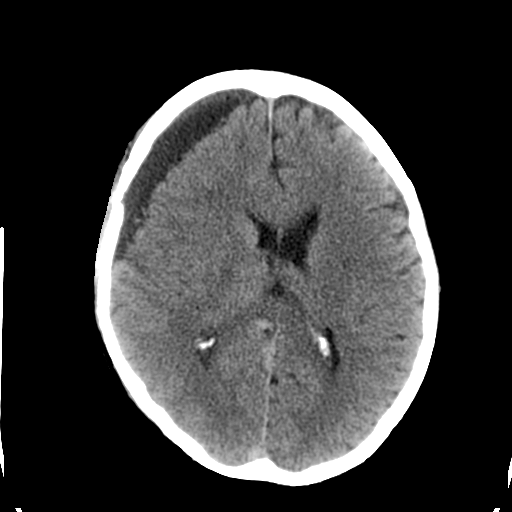
[im 16/30  bone]
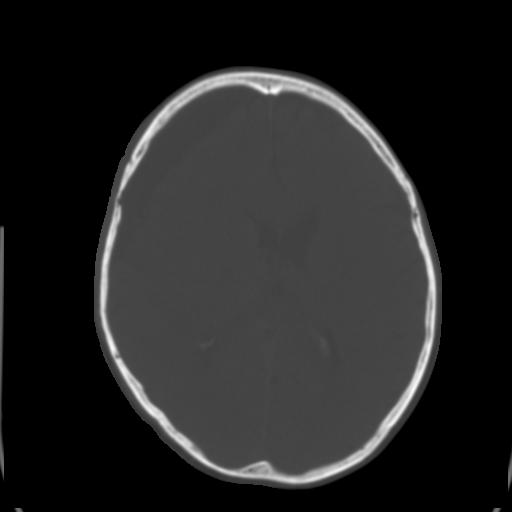
[im 18/30  brain]
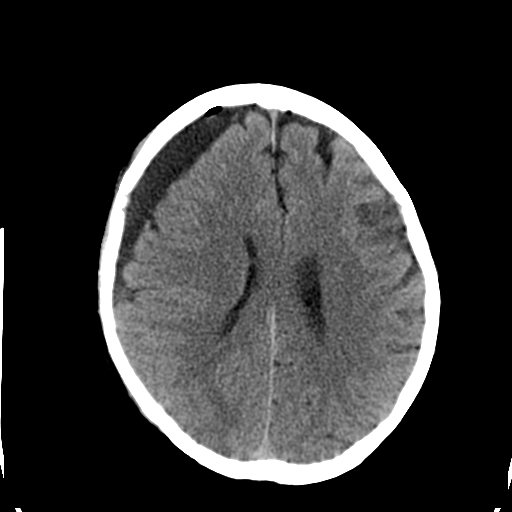
[im 20/30  brain]
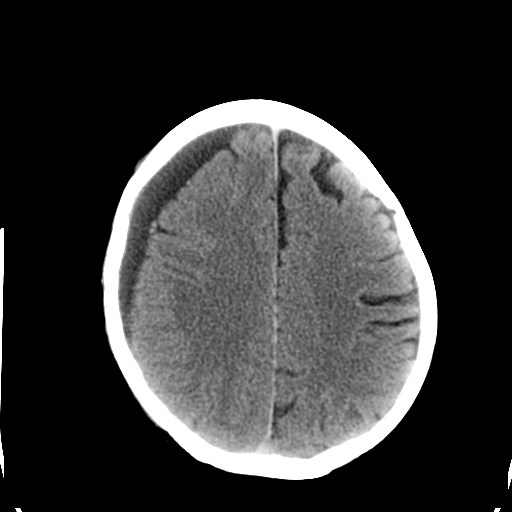
[im 22/30  brain]
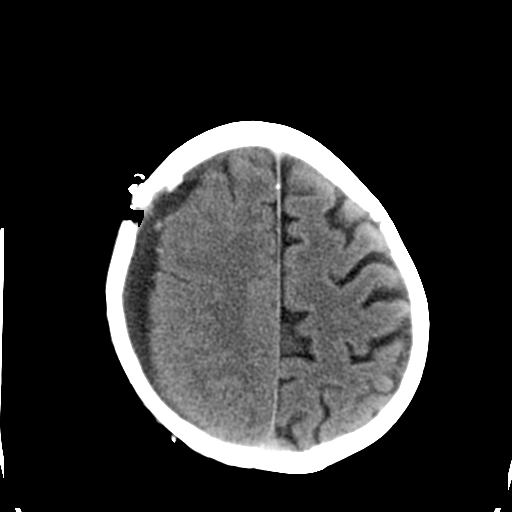
[im 23/30  brain]
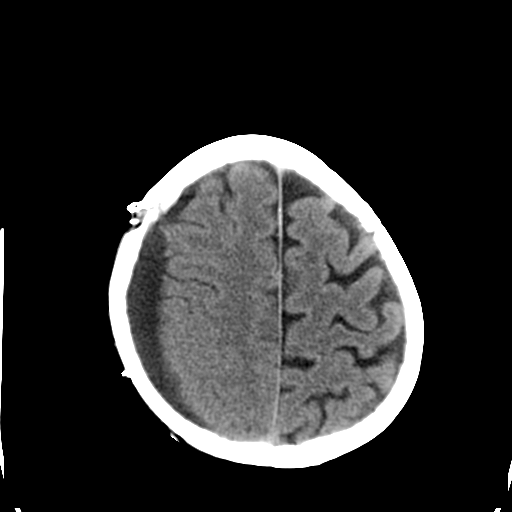
[im 23/30  bone]
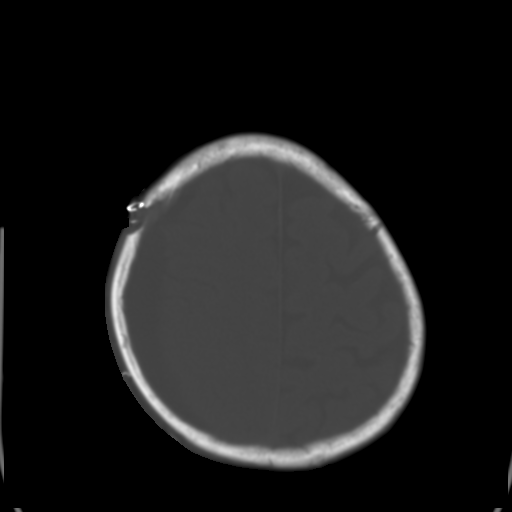
[im 25/30  brain]
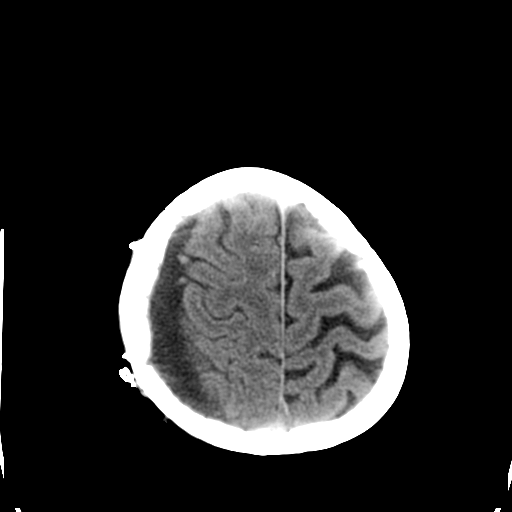
[im 27/30  brain]
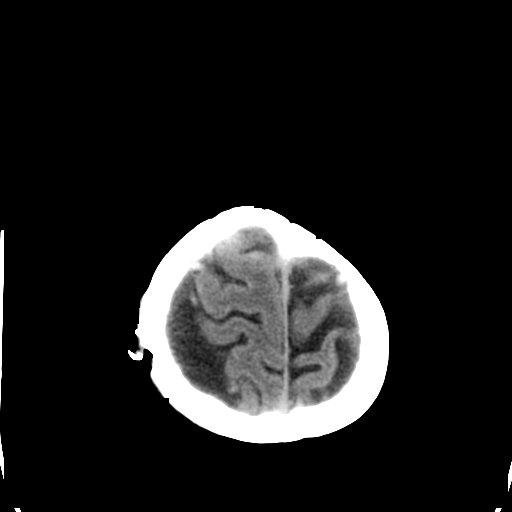
[im 29/30  brain]
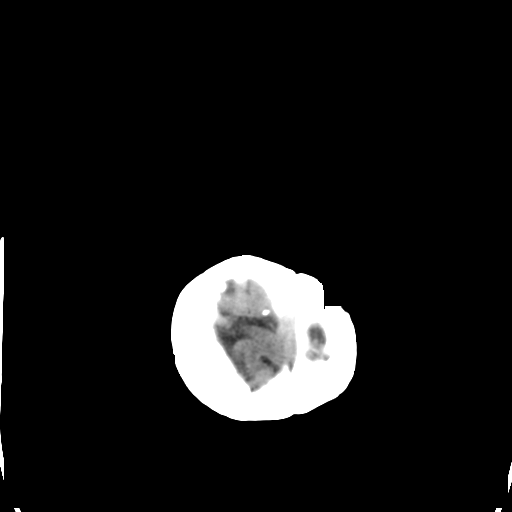

[16 of 30 positions shown; findings below may reference images not displayed]

FINDINGS: The previously identified right subacute subdural
hematoma is improved compared with the preoperative study, but
unchanged compared with the immediate postoperative study.  As
measured on image 24 series 2, residual extra-axial fluid
collection measures 16 mm thickness.  Overall attenuation is
diminished, near CSF, in the 8-10 HU range.  There is moderate mass
effect on the frontal parietal cortex.  Slight pneumocephalus.
Surgical drain remains good position via the right parietal burr
hole.
IMPRESSION: Residual extra-axial low attenuation fluid collection, likely right
subdural hygroma, remains with significant mass effect on the
underlying brain, maximum of 16 mm thickness.  See comments above.
The appearance is unchanged from 05/17/2012.

## 2013-07-06 IMAGING — CT CT HEAD W/O CM
1 of 2 series · 15 of 30 positions shown, 19 images · non-contrast
Comparison: 05/18/2012.

CLINICAL DATA: Subdural hematoma.

CT HEAD WITHOUT CONTRAST
TECHNIQUE: Contiguous axial images were obtained from the base of
the skull through the vertex without contrast.

[Series 2: head routine 4.8 h37s · axial · 0.43mm/px · z∈[-37,+91]mm · 15 of 30 slices shown, 19 images]
[im 2/30  brain]
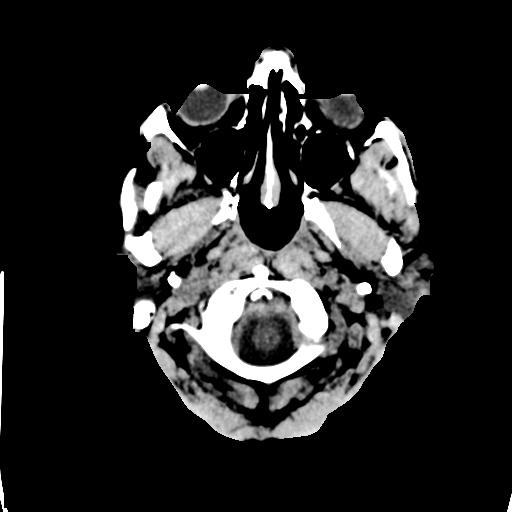
[im 2/30  bone]
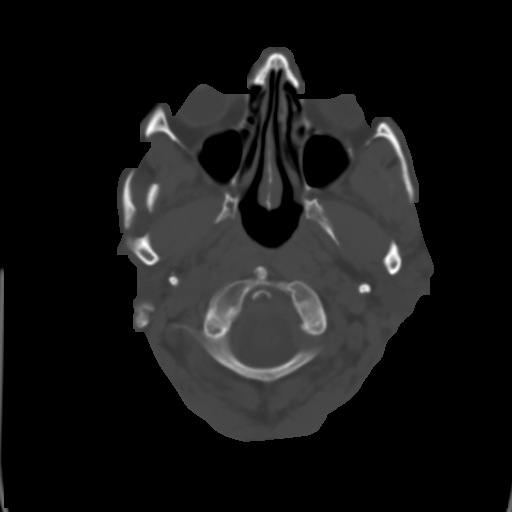
[im 4/30  brain]
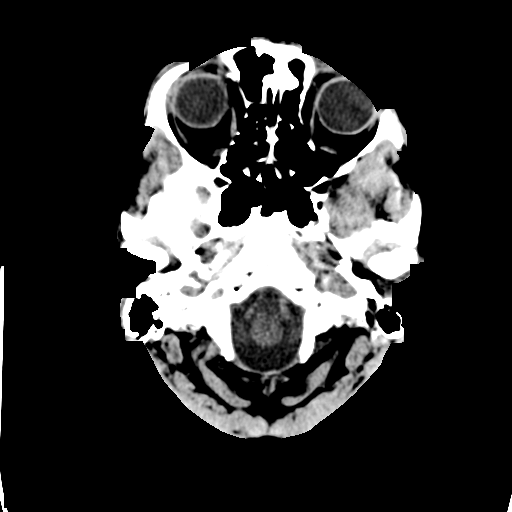
[im 5/30  brain]
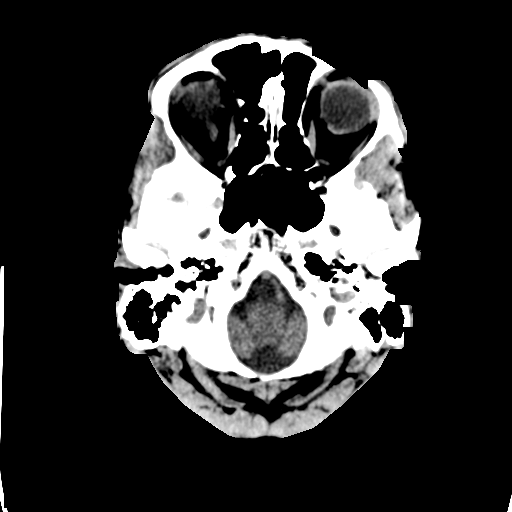
[im 8/30  brain]
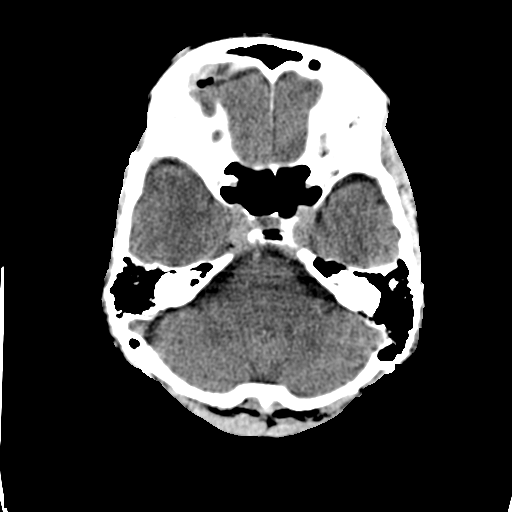
[im 9/30  brain]
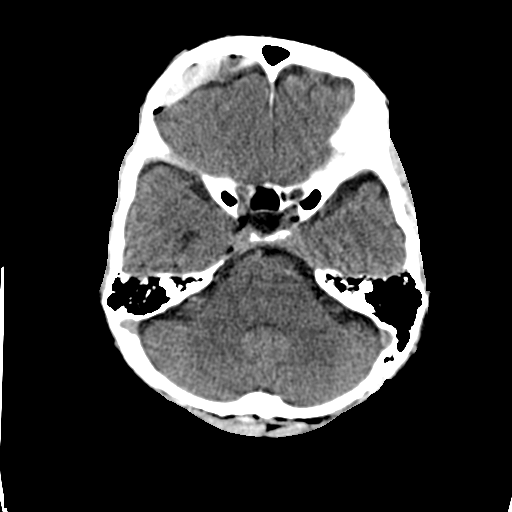
[im 9/30  bone]
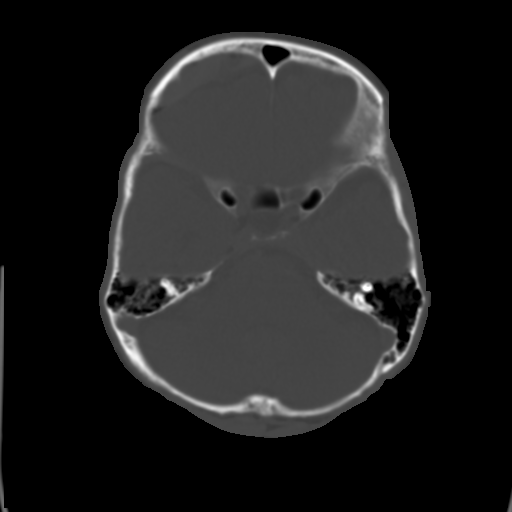
[im 11/30  brain]
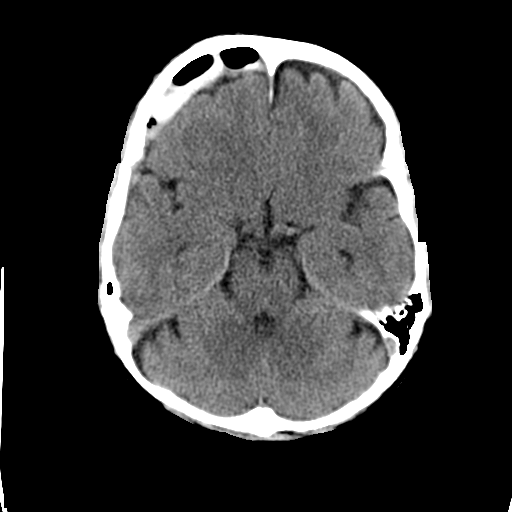
[im 13/30  brain]
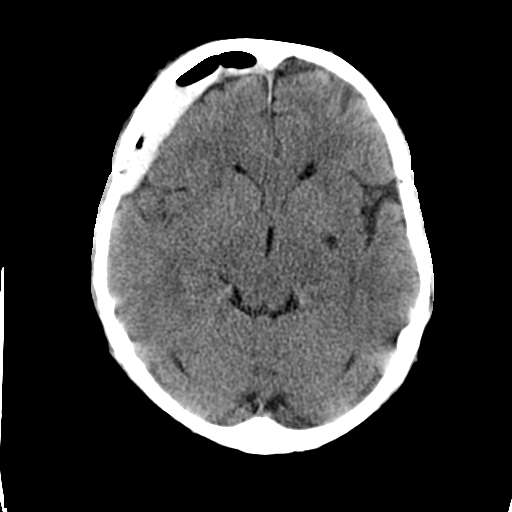
[im 15/30  brain]
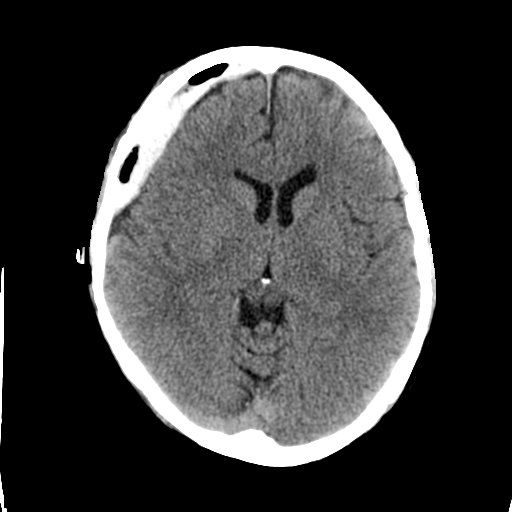
[im 17/30  brain]
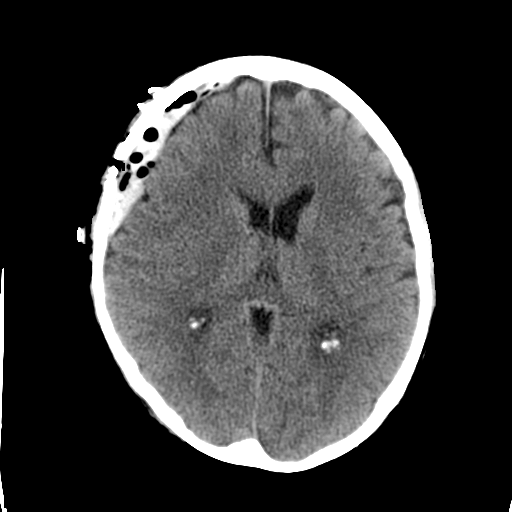
[im 17/30  bone]
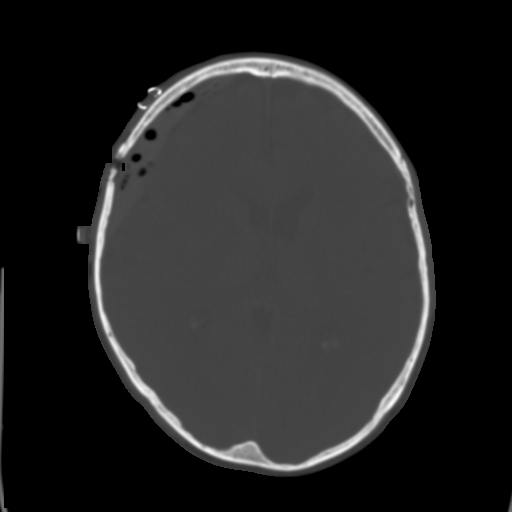
[im 19/30  brain]
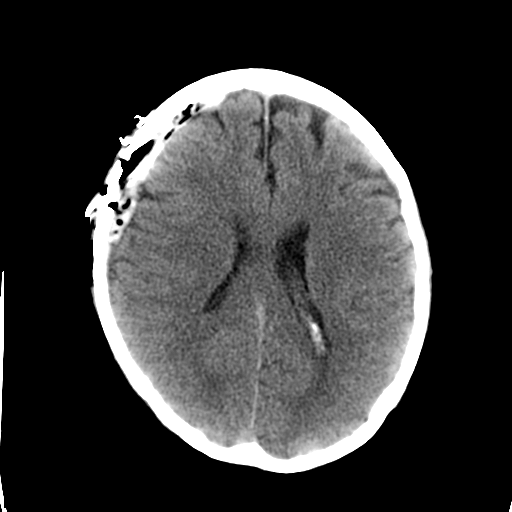
[im 21/30  brain]
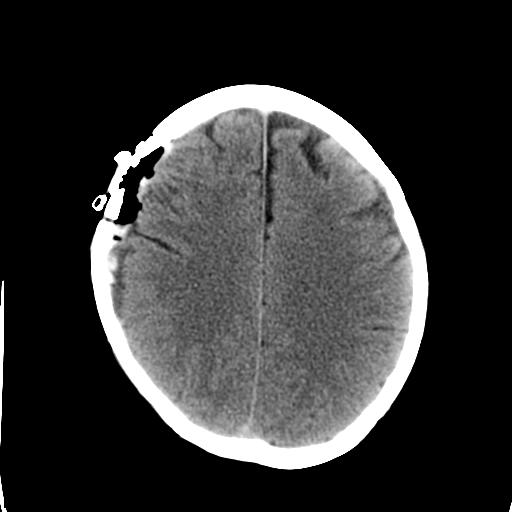
[im 22/30  brain]
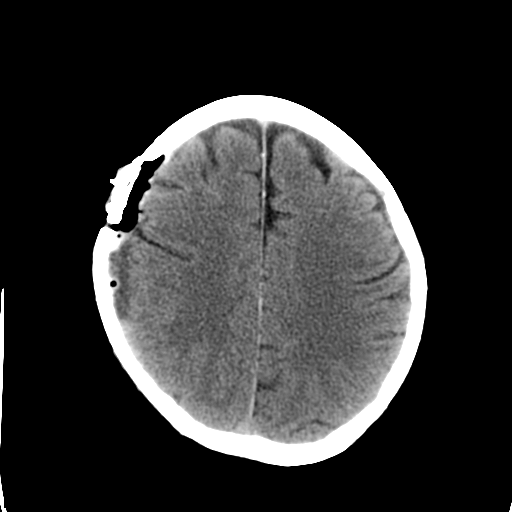
[im 25/30  brain]
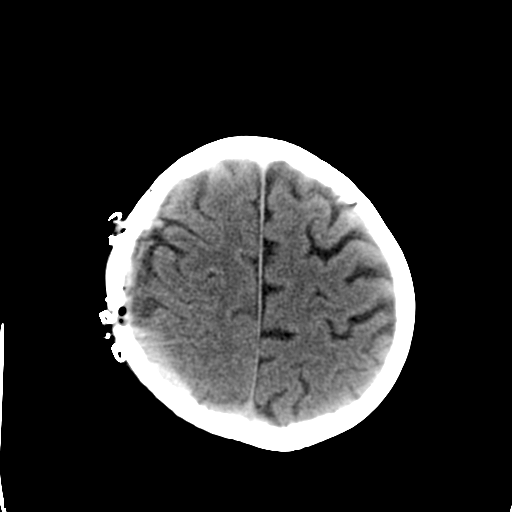
[im 25/30  bone]
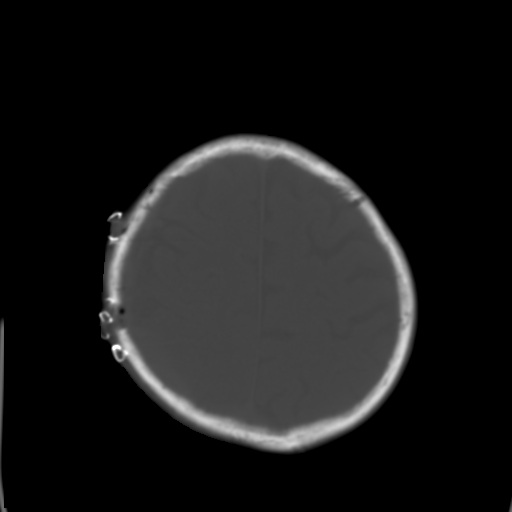
[im 26/30  brain]
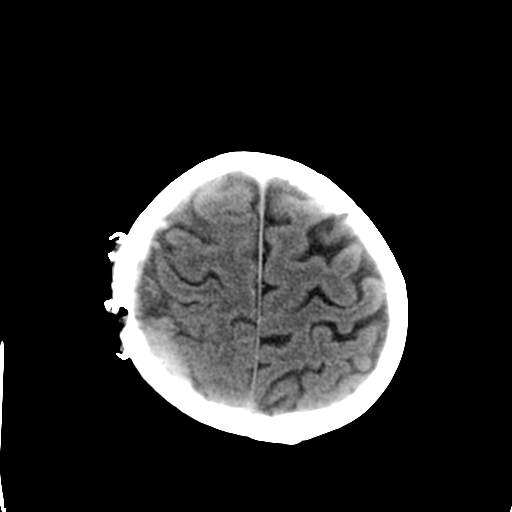
[im 28/30  brain]
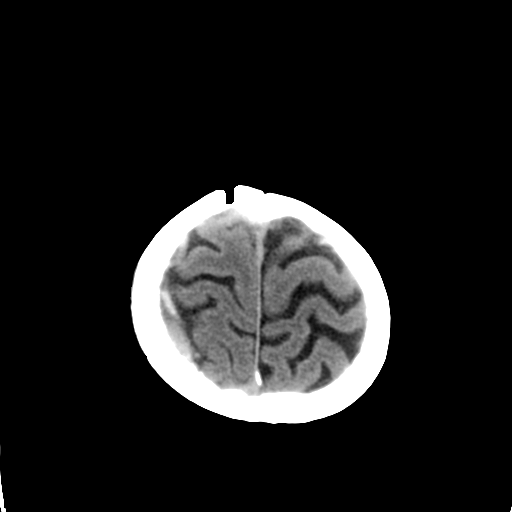

[15 of 30 positions shown; findings below may reference images not displayed]

FINDINGS: The patient is now status post right frontal craniotomy.
There is a residual extra-axial fluid collection with high-density
material suggesting interval acute hemorrhage.  Pneumocephalus is
again noted.  A drainage catheter is in place.  There is local
effacement of the sulci.  Midline shift at the foramen of Joon has
decreased from 5.5 mm to 4.3 mm.  There is slight asymmetry of the
effacement of the right lateral ventricle.  No parenchymal
hemorrhage or definite subarachnoid hemorrhages evident.  The
collection appears less prominent over the convexity.  There are
some layering blood products as well.

The paranasal sinuses and mastoid air cells are clear.
IMPRESSION: 1.  Status post right frontal craniotomy.
2.  A residual extra-axial fluid collection demonstrates high
density blood products compatible with interval hemorrhage.  The
size of the collection is similar anterior and lateral to the
anterior frontal lobe.  It is decreased over the convexity.
3.  Layering blood products are noted as well.
4.  Drainage catheter is in place.  Pneumocephalus is noted.
5.  Slight decrease in midline shift.

## 2013-08-02 ENCOUNTER — Ambulatory Visit: Payer: No Typology Code available for payment source | Admitting: Family Medicine

## 2013-08-13 NOTE — Telephone Encounter (Signed)
Patient is calling to check the status of [facility].

## 2013-08-28 ENCOUNTER — Encounter: Payer: Self-pay | Admitting: Family Medicine

## 2013-08-28 ENCOUNTER — Ambulatory Visit (INDEPENDENT_AMBULATORY_CARE_PROVIDER_SITE_OTHER): Payer: No Typology Code available for payment source | Admitting: Family Medicine

## 2013-08-28 VITALS — BP 145/74 | HR 68 | Temp 98.2°F | Ht 68.0 in | Wt 177.0 lb

## 2013-08-28 DIAGNOSIS — R42 Dizziness and giddiness: Secondary | ICD-10-CM | POA: Insufficient documentation

## 2013-08-28 DIAGNOSIS — N400 Enlarged prostate without lower urinary tract symptoms: Secondary | ICD-10-CM

## 2013-08-28 NOTE — Patient Instructions (Signed)
Mr. Frederick Knight, it was nice seeing you today.  Please go to the [facility] to get your CT scan and we will see you back in one month.  If you have any trouble walking or actually have a loss of consciousness, please go to the emergency room or give [place] a call.  Thanks, Dr. Paulina Fusi

## 2013-08-28 NOTE — Progress Notes (Signed)
Frederick Knight is a 62 y.o. male who presents today for f/u of his abdomen/pelvis CT scan, abdominal pain, dizziness.   Abdominal Pain - Since last visit, pt continues to have intermittent suprapubic abdominal pain.  His CT scan shows no acute processes, a possible hemangioma, remote R rib fx.  He denies any weight loss, back pain, hematuria, stool changes, fever, chills.  Dizziness - Pt c/o dizziness w/ standing that has been ongoing for about the last 6 months now.  He previously had a fall, subdural hematoma requiring evacuation from a neurosurgeon in June 2013.  Denies any syncopal episodes, blurred vision, diplopia.    Past Medical History  Diagnosis Date  . Hyperlipemia     History  Smoking status  . Never Smoker   Smokeless tobacco  . Not on file    No family history on file.  Current Outpatient Prescriptions on File Prior to Visit  Medication Sig Dispense Refill  . levETIRAcetam (KEPPRA) 500 MG tablet Take 1 tablet (500 mg total) by mouth 2 (two) times daily.  60 tablet  1  . rosuvastatin (CRESTOR) 10 MG tablet Take 1 tablet (10 mg total) by mouth daily.  30 tablet  5   No current facility-administered medications on file prior to visit.    ROS: Per HPI.  All other systems reviewed and are negative.   Physical Exam Filed Vitals:   08/28/13 0852  BP: 145/74  Pulse: 68  Temp: 98.2 F (36.8 C)    Physical Examination: General appearance - alert, well appearing, and in no distress Mental status - alert, oriented to person, place, and time Eyes - left eye normal, right eye normal Extremities - no pedal edema noted Skin - normal coloration and turgor, no rashes, no suspicious skin lesions noted    Chemistry      Component Value Date/Time   NA 141 06/04/2013 1017   K 4.1 06/04/2013 1017   CL 106 06/04/2013 1017   CO2 27 06/04/2013 1017   BUN 15 06/04/2013 1017   CREATININE 1.11 06/04/2013 1017   CREATININE 1.00 05/18/2012 2048      Component Value Date/Time   CALCIUM 9.7 06/04/2013 1017   ALKPHOS 76 05/16/2012 0904   AST 36 05/16/2012 0904   ALT 28 05/16/2012 0904   BILITOT 0.3 05/16/2012 0904

## 2013-08-28 NOTE — Assessment & Plan Note (Signed)
Pt c/o worsening dizziness over the last 6 months now, two-three episodes per week, no syncope, focal neurologic deficit. Will get CT head w/o contrast to evaluate for process as he has had previous subdural hematoma w/ evacuation in the past.  Most likely will need neurology f/u, however, he does have the orange card so this will make it dicey.

## 2013-08-28 NOTE — Assessment & Plan Note (Signed)
Pt c/o increased urinary frequency, urgency, but no dysuria, weight loss, hematuria.  Pt would like PSA at this point so we will get done.

## 2013-08-29 ENCOUNTER — Encounter: Payer: Self-pay | Admitting: Family Medicine

## 2013-09-04 ENCOUNTER — Ambulatory Visit (HOSPITAL_COMMUNITY)
Admission: RE | Admit: 2013-09-04 | Discharge: 2013-09-04 | Disposition: A | Discharge status: Home / Self Care | Payer: No Typology Code available for payment source | Source: Ambulatory Visit | Attending: Family Medicine | Admitting: Family Medicine

## 2013-09-04 DIAGNOSIS — R42 Dizziness and giddiness: Secondary | ICD-10-CM

## 2013-09-26 ENCOUNTER — Ambulatory Visit (INDEPENDENT_AMBULATORY_CARE_PROVIDER_SITE_OTHER): Payer: No Typology Code available for payment source | Admitting: Family Medicine

## 2013-09-26 VITALS — BP 132/75 | HR 84 | Temp 98.1°F | Ht 68.0 in | Wt 178.0 lb

## 2013-09-26 DIAGNOSIS — N400 Enlarged prostate without lower urinary tract symptoms: Secondary | ICD-10-CM

## 2013-09-26 DIAGNOSIS — J0110 Acute frontal sinusitis, unspecified: Secondary | ICD-10-CM | POA: Insufficient documentation

## 2013-09-26 DIAGNOSIS — Z23 Encounter for immunization: Secondary | ICD-10-CM

## 2013-09-26 DIAGNOSIS — K029 Dental caries, unspecified: Secondary | ICD-10-CM

## 2013-09-26 DIAGNOSIS — J019 Acute sinusitis, unspecified: Secondary | ICD-10-CM

## 2013-09-26 DIAGNOSIS — R42 Dizziness and giddiness: Secondary | ICD-10-CM

## 2013-09-26 MED ORDER — FLUTICASONE PROPIONATE 50 MCG/ACT NA SUSP: 2.0000 | Freq: Every day | NASAL | Status: DC

## 2013-09-26 NOTE — Assessment & Plan Note (Signed)
Most likely viral, 1-2 days of Sx, no fever, chills, productive sputum.  Will tx Sx at this point, and if starts to develop fever, or gets worse over the next week, will consider augmentin Rx/Amoxicillin to be called in for 10-14 days.

## 2013-09-26 NOTE — Assessment & Plan Note (Signed)
No changes, CT of head does not show acute processes.  If continues for the next several months, will refer to neurology for further evaluation.  Consider BPPV in differential, however.

## 2013-09-26 NOTE — Progress Notes (Signed)
Frederick Knight is a 62 y.o. male who presents today for f/u lab results.  PSA - Normal, continues to have BPH like Sx including incomplete voiding, increased urgency, polyuria, but denies weight loss or hematuria   Acute Sinusitis - Sx started two days ago, located in frontal sinus and maxillary sinus, has some clear rhinorrhea as well.  Denies fever, pain with eye movement, dysphagia, cough.  Has not tried anything for this and denies anything that specifically makes his Sx worse.  No neck pain, recent sick contacts, or travel.   Dizziness - CT of head was normal, no changes since last visit, and no red flags including nausea, vomiting, tinnitus, worst HA of life, focal weakness.   Past Medical History  Diagnosis Date  . Hyperlipemia     History  Smoking status  . Never Smoker   Smokeless tobacco  . Not on file    No family history on file.  Current Outpatient Prescriptions on File Prior to Visit  Medication Sig Dispense Refill  . levETIRAcetam (KEPPRA) 500 MG tablet Take 1 tablet (500 mg total) by mouth 2 (two) times daily.  60 tablet  1  . rosuvastatin (CRESTOR) 10 MG tablet Take 1 tablet (10 mg total) by mouth daily.  30 tablet  5   No current facility-administered medications on file prior to visit.    ROS: Per HPI.  All other systems reviewed and are negative.   Physical Exam Filed Vitals:   09/26/13 0837  BP: 132/75  Pulse: 84  Temp: 98.1 F (36.7 C)    Physical Examination: General appearance - alert, well appearing, and in no distress  Mental status - alert, oriented to person, place, and time  HEENT: [place]/AT, MMM, EOMI B/L, + Sinus tenderness, no nasal erythema, + turbinate edema, no postnasal drip or tonsillar exudates No LAD  Extremities - no pedal edema noted  Skin - normal coloration and turgor, no rashes, no suspicious skin lesions noted   Lab Results  Component Value Date   PSA 1.51 08/28/2013   PSA 1.23 04/19/2010

## 2013-09-26 NOTE — Assessment & Plan Note (Signed)
PSA done on 9/17 normal, most likely BPH causing him Sx.  Can consider flomax/doxazosin if Sx continue to be troublesome.

## 2013-09-26 NOTE — Patient Instructions (Signed)
Frederick Knight, it was nice seeing you today.  You most likely have acute sinusitis (congestion of your sinuses) that is caused by a virus.  Please use some over the counter medications including mucinex, nasal saline, tylenol/advil, and the nasonex i will call in.  If this does not improve in the next 7-10 days, please give me a call.  Thanks, Dr. Paulina Fusi

## 2013-11-22 ENCOUNTER — Ambulatory Visit: Payer: No Typology Code available for payment source

## 2013-12-16 ENCOUNTER — Telehealth: Payer: Self-pay | Admitting: Family Medicine

## 2013-12-16 DIAGNOSIS — E785 Hyperlipidemia, unspecified: Secondary | ICD-10-CM

## 2013-12-16 MED ORDER — ROSUVASTATIN CALCIUM 10 MG PO TABS: 10.0000 mg | ORAL_TABLET | Freq: Every day | ORAL | Status: DC

## 2013-12-16 NOTE — Telephone Encounter (Signed)
Completed. Ready for pick up.  Thanks, Tamela Oddi. Chan Rosasco, DO of [facility] 12/16/2013, 1:40 PM

## 2013-12-16 NOTE — Telephone Encounter (Signed)
Patient needs a printed RX for Crestor. Please call patient for pick up.

## 2014-01-21 ENCOUNTER — Telehealth: Payer: Self-pay | Admitting: Family Medicine

## 2014-01-21 DIAGNOSIS — E785 Hyperlipidemia, unspecified: Secondary | ICD-10-CM

## 2014-01-21 NOTE — Telephone Encounter (Addendum)
Pt called and sent his refill through the mail for his Crestor, the online company said it never received it. He would like [place] to fax his prescription for Crestor to them at [phone]. jw  Printed and ready to be faxed.  Thanks, Tamela Oddi. Awanda Mink, DO of [facility] 01/22/2014, 2:22 PM

## 2014-01-22 MED ORDER — ROSUVASTATIN CALCIUM 10 MG PO TABS: 10.0000 mg | ORAL_TABLET | Freq: Every day | ORAL | Status: DC

## 2014-01-30 ENCOUNTER — Ambulatory Visit (INDEPENDENT_AMBULATORY_CARE_PROVIDER_SITE_OTHER): Payer: No Typology Code available for payment source | Admitting: Family Medicine

## 2014-01-30 ENCOUNTER — Encounter: Payer: Self-pay | Admitting: Family Medicine

## 2014-01-30 VITALS — BP 137/73 | Temp 98.0°F | Ht 68.0 in | Wt 182.0 lb

## 2014-01-30 DIAGNOSIS — L820 Inflamed seborrheic keratosis: Secondary | ICD-10-CM | POA: Insufficient documentation

## 2014-01-30 DIAGNOSIS — R0989 Other specified symptoms and signs involving the circulatory and respiratory systems: Secondary | ICD-10-CM

## 2014-01-30 DIAGNOSIS — R0600 Dyspnea, unspecified: Secondary | ICD-10-CM | POA: Insufficient documentation

## 2014-01-30 DIAGNOSIS — N400 Enlarged prostate without lower urinary tract symptoms: Secondary | ICD-10-CM

## 2014-01-30 DIAGNOSIS — E785 Hyperlipidemia, unspecified: Secondary | ICD-10-CM

## 2014-01-30 DIAGNOSIS — R0609 Other forms of dyspnea: Secondary | ICD-10-CM

## 2014-01-30 NOTE — Assessment & Plan Note (Signed)
Continues to be compliant with Crestor, no myalgias.  Continue for now, check Lipid, CMET, CBC today.

## 2014-01-30 NOTE — Progress Notes (Signed)
Frederick Knight is a 63 y.o. male who presents today for check up.  Hyperlipidemia - Stable on Crestor, no myalgias.  Inflammed SK R cheek - Has been there for several yrs, now starting to become pruritic.  Denies any surrounding erythema or darkening or suspicious lesions.  BPH - Stable, does not want medication at this point.   Past Medical History  Diagnosis Date  . Hyperlipemia     History  Smoking status  . Never Smoker   Smokeless tobacco  . Not on file    No family history on file.  Current Outpatient Prescriptions on File Prior to Visit  Medication Sig Dispense Refill  . fluticasone (FLONASE) 50 MCG/ACT nasal spray Place 2 sprays into the nose daily.  16 g  2  . levETIRAcetam (KEPPRA) 500 MG tablet Take 1 tablet (500 mg total) by mouth 2 (two) times daily.  60 tablet  1  . rosuvastatin (CRESTOR) 10 MG tablet Take 1 tablet (10 mg total) by mouth daily.  30 tablet  11   No current facility-administered medications on file prior to visit.    ROS: Per HPI.  All other systems reviewed and are negative.   Physical Exam Filed Vitals:   01/30/14 1522  BP: 137/73  Temp: 98 F (36.7 C)    Physical Examination: General appearance - alert, well appearing, and in no distress  Mental status - alert, oriented to person, place, and time  HEENT: +erythematous papule with crusting R maxillary region and erythematous brown based papule on the R parietal scalp Extremities - no pedal edema noted  Lungs: CTA B/L Cardio: RRR Neck: No JVD

## 2014-01-30 NOTE — Assessment & Plan Note (Signed)
C/O occasional dyspnea with laying flat, however, notices it is only when he is sick.  Denies any Paroxysmal nocturnal dyspnea, pillow orthopnea (sleeps with one pillow for comfort) and states he is able to walk from the car in the back of the grocery lot, around the grocery store, and back without any SOB.   Most likely related to the weather with humidity, continue to monitor and consider CXR in future if no improvement.  No smoking history or exposure history with work.

## 2014-01-30 NOTE — Assessment & Plan Note (Signed)
Stable, does not want medication at this time.

## 2014-01-30 NOTE — Assessment & Plan Note (Signed)
Cryotherapy today x 1 on the R cheek and x 1 on scalp.  Repeat as indicated.

## 2014-01-30 NOTE — Patient Instructions (Signed)
Mr. Eslick, please return for your lab work when you get the chance, without eating for 8 hours.  Thanks, Dr. Awanda Mink

## 2014-02-11 ENCOUNTER — Other Ambulatory Visit: Payer: No Typology Code available for payment source

## 2014-02-11 ENCOUNTER — Other Ambulatory Visit (INDEPENDENT_AMBULATORY_CARE_PROVIDER_SITE_OTHER): Payer: No Typology Code available for payment source

## 2014-02-11 DIAGNOSIS — E785 Hyperlipidemia, unspecified: Secondary | ICD-10-CM

## 2014-02-11 LAB — CBC
HCT: 48.5 % (ref 39.0–52.0)
Hemoglobin: 16.7 g/dL (ref 13.0–17.0)
MCH: 29.9 pg (ref 26.0–34.0)
MCHC: 34.4 g/dL (ref 30.0–36.0)
MCV: 86.8 fL (ref 78.0–100.0)
PLATELETS: 146 10*3/uL — AB (ref 150–400)
RBC: 5.59 MIL/uL (ref 4.22–5.81)
RDW: 13.5 % (ref 11.5–15.5)
WBC: 6.2 10*3/uL (ref 4.0–10.5)

## 2014-02-11 LAB — COMPREHENSIVE METABOLIC PANEL
ALBUMIN: 4.6 g/dL (ref 3.5–5.2)
ALK PHOS: 78 U/L (ref 39–117)
ALT: 31 U/L (ref 0–53)
AST: 22 U/L (ref 0–37)
BUN: 14 mg/dL (ref 6–23)
CALCIUM: 9.6 mg/dL (ref 8.4–10.5)
CHLORIDE: 104 meq/L (ref 96–112)
CO2: 26 mEq/L (ref 19–32)
CREATININE: 1.13 mg/dL (ref 0.50–1.35)
Glucose, Bld: 107 mg/dL — ABNORMAL HIGH (ref 70–99)
POTASSIUM: 4.2 meq/L (ref 3.5–5.3)
Sodium: 139 mEq/L (ref 135–145)
Total Bilirubin: 0.7 mg/dL (ref 0.2–1.2)
Total Protein: 7.4 g/dL (ref 6.0–8.3)

## 2014-02-11 LAB — LIPID PANEL
CHOL/HDL RATIO: 3.3 ratio
Cholesterol: 142 mg/dL (ref 0–200)
HDL: 43 mg/dL (ref >39)
LDL CALC: 83 mg/dL (ref 0–99)
Triglycerides: 81 mg/dL (ref <150)
VLDL: 16 mg/dL (ref 0–40)

## 2014-02-11 NOTE — Progress Notes (Signed)
CMP,CBC AND FLP DONE TODAY Malaney Mcbean 

## 2014-03-05 ENCOUNTER — Other Ambulatory Visit: Payer: Self-pay | Admitting: Family Medicine

## 2014-03-05 ENCOUNTER — Telehealth: Payer: Self-pay | Admitting: Family Medicine

## 2014-03-05 DIAGNOSIS — E785 Hyperlipidemia, unspecified: Secondary | ICD-10-CM

## 2014-03-05 MED ORDER — ROSUVASTATIN CALCIUM 10 MG PO TABS: 10.0000 mg | ORAL_TABLET | Freq: Every day | ORAL | Status: DC

## 2014-03-05 NOTE — Telephone Encounter (Signed)
The Rx has printed two times right off the computer.  I'm a little perplexed at how this is not sufficient.  Can we please find out more because this printed Rx has both DEA # and Rx strength.  Thanks, Tamela Oddi. Awanda Mink, DO of [facility] 03/05/2014, 10:33 AM

## 2014-03-05 NOTE — Progress Notes (Signed)
New Rx with both mg and DEA # printed and ready for fax  Thanks, Tamela Oddi. Awanda Mink, DO of [facility] 03/05/2014, 2:26 PM

## 2014-03-05 NOTE — Telephone Encounter (Signed)
Mr. Maniscalco called to say the rx for Crestor did not have the 10mg  listed nor the DEA exp date.  Need new rx faxed to them to process medication..  Or reprinted and have patient mail back in.

## 2014-06-09 ENCOUNTER — Ambulatory Visit: Payer: No Typology Code available for payment source

## 2014-07-03 ENCOUNTER — Encounter: Payer: Self-pay | Admitting: Family Medicine

## 2014-07-03 ENCOUNTER — Ambulatory Visit (INDEPENDENT_AMBULATORY_CARE_PROVIDER_SITE_OTHER): Payer: No Typology Code available for payment source | Admitting: Family Medicine

## 2014-07-03 VITALS — BP 135/82 | HR 91 | Temp 98.4°F | Ht 68.0 in | Wt 175.0 lb

## 2014-07-03 DIAGNOSIS — L02219 Cutaneous abscess of trunk, unspecified: Secondary | ICD-10-CM

## 2014-07-03 DIAGNOSIS — L03319 Cellulitis of trunk, unspecified: Secondary | ICD-10-CM

## 2014-07-03 DIAGNOSIS — L0390 Cellulitis, unspecified: Secondary | ICD-10-CM | POA: Insufficient documentation

## 2014-07-03 DIAGNOSIS — L03312 Cellulitis of back [any part except buttock]: Secondary | ICD-10-CM

## 2014-07-03 MED ORDER — DOXYCYCLINE HYCLATE 100 MG PO TABS: 100.0000 mg | ORAL_TABLET | Freq: Two times a day (BID) | ORAL | Status: DC

## 2014-07-03 NOTE — Progress Notes (Signed)
Patient ID: Frederick Knight, male   DOB: 05-01-1951, 63 y.o.   MRN: [id number]  [name], MD Phone: [phone]  Subjective:  Chief complaint-noted  Pt Here for insect bite  Patient states that he had an insect bite on his back about 3 or 4 days ago. Yesterday he began to have chills, sweats, subjective fever, pain in the area, myalgias in his bilateral lower extremities, and headache.  He denies any rash.   He previously had a tick bite on his abdomen a few weeks ago and the erythematous area is slowly improving. He did not remove a tick from this area.   ROS-  Positive subjective fever Normal by mouth intake No dyspnea   Past Medical History Patient Active Problem List   Diagnosis Date Noted  . Cellulitis 07/03/2014  . Inflamed seborrheic keratosis of right cheek 01/30/2014  . Dyspnea 01/30/2014  . Dizziness 08/28/2013  . Hyperlipidemia 03/19/2013  . Hypertension 01/18/2013  . BPH (benign prostatic hyperplasia) 01/18/2013    Medications- reviewed and updated Current Outpatient Prescriptions  Medication Sig Dispense Refill  . doxycycline (VIBRA-TABS) 100 MG tablet Take 1 tablet (100 mg total) by mouth 2 (two) times daily.  20 tablet  0  . levETIRAcetam (KEPPRA) 500 MG tablet Take 1 tablet (500 mg total) by mouth 2 (two) times daily.  60 tablet  1  . rosuvastatin (CRESTOR) 10 MG tablet Take 1 tablet (10 mg total) by mouth daily.  30 tablet  11   No current facility-administered medications for this visit.    Objective: BP 135/82  Pulse 91  Temp(Src) 98.4 F (36.9 C) (Oral)  Ht 5\' 8"  (1.727 m)  Wt 175 lb (79.379 kg)  BMI 26.61 kg/m2 Gen: NAD, alert, cooperative with exam, sweaty with red face HEENT: NCAT CV: RRR, good S1/S2, no murmur Resp: CTABL, no wheezes, non-labored Neuro: Alert and oriented, No gross deficits  Skin with 2.5 centimeter by 1 cm erythematous area with half centimeter crusted area centrally on mid L back. Slight tenderness to  palpation, slightly warm, no surrounding erythema outside the 2.5 cm margin  Assessment/Plan:  Cellulitis Lesion of his back with erythema and warmth and small area of induration Considering his systemic symptoms of subjective fever, sweats, myalgias, and headache will consider this as an infection Could be an early developing tick borne illness like  ehrlichiosis or RMSF but no classic findings No signs of other infection - like pneumonia Treatment doxycycline 100 mg twice a day x10 days Red flags for return reviewed and provided on AVS      Meds ordered this encounter  Medications  . doxycycline (VIBRA-TABS) 100 MG tablet    Sig: Take 1 tablet (100 mg total) by mouth 2 (two) times daily.    Dispense:  20 tablet    Refill:  0

## 2014-07-03 NOTE — Patient Instructions (Signed)
Great to meet you   Be sure to finish all of your antibiotics,.   Cellulitis Cellulitis is an infection of the skin and the tissue beneath it. The infected area is usually red and tender. Cellulitis occurs most often in the arms and lower legs.  CAUSES  Cellulitis is caused by bacteria that enter the skin through cracks or cuts in the skin. The most common types of bacteria that cause cellulitis are staphylococci and streptococci. SIGNS AND SYMPTOMS   Redness and warmth.  Swelling.  Tenderness or pain.  Fever. DIAGNOSIS  Your health care provider can usually determine what is wrong based on a physical exam. Blood tests may also be done. TREATMENT  Treatment usually involves taking an antibiotic medicine. HOME CARE INSTRUCTIONS   Take your antibiotic medicine as directed by your health care provider. Finish the antibiotic even if you start to feel better.  Keep the infected arm or leg elevated to reduce swelling.  Apply a warm cloth to the affected area up to 4 times per day to relieve pain.  Take medicines only as directed by your health care provider.  Keep all follow-up visits as directed by your health care provider. SEEK MEDICAL CARE IF:   You notice red streaks coming from the infected area.  Your red area gets larger or turns dark in color.  Your bone or joint underneath the infected area becomes painful after the skin has healed.  Your infection returns in the same area or another area.  You notice a swollen bump in the infected area.  You develop new symptoms.  You have a fever. SEEK IMMEDIATE MEDICAL CARE IF:   You feel very sleepy.  You develop vomiting or diarrhea.  You have a general ill feeling (malaise) with muscle aches and pains. MAKE SURE YOU:   Understand these instructions.  Will watch your condition.  Will get help right away if you are not doing well or get worse. Document Released: 09/07/2005 Document Revised: 04/14/2014 Document  Reviewed: 02/13/2012 [facility] Patient Information 2015 [place], [place]. This information is not intended to replace advice given to you by your health care provider. Make sure you discuss any questions you have with your health care provider.

## 2014-07-03 NOTE — Assessment & Plan Note (Addendum)
Lesion of his back with erythema and warmth and small area of induration Considering his systemic symptoms of subjective fever, sweats, myalgias, and headache will consider this as an infection Could be an early developing tick borne illness like  ehrlichiosis or RMSF but no classic findings No signs of other infection - like pneumonia Treatment doxycycline 100 mg twice a day x10 days Red flags for return reviewed and provided on AVS

## 2014-07-07 ENCOUNTER — Encounter: Payer: Self-pay | Admitting: Family Medicine

## 2014-07-07 ENCOUNTER — Ambulatory Visit (INDEPENDENT_AMBULATORY_CARE_PROVIDER_SITE_OTHER): Payer: No Typology Code available for payment source | Admitting: Family Medicine

## 2014-07-07 VITALS — BP 112/62 | HR 78 | Temp 97.7°F | Resp 16

## 2014-07-07 DIAGNOSIS — L02219 Cutaneous abscess of trunk, unspecified: Secondary | ICD-10-CM

## 2014-07-07 DIAGNOSIS — L03312 Cellulitis of back [any part except buttock]: Secondary | ICD-10-CM

## 2014-07-07 DIAGNOSIS — L03319 Cellulitis of trunk, unspecified: Secondary | ICD-10-CM

## 2014-07-07 NOTE — Patient Instructions (Signed)
Great to see you again!  Cellulitis Cellulitis is an infection of the skin and the tissue under the skin. The infected area is usually red and tender. This happens most often in the arms and lower legs. HOME CARE   Take your antibiotic medicine as told. Finish the medicine even if you start to feel better.  Keep the infected arm or leg raised (elevated).  Put a warm cloth on the area up to 4 times per day.  Only take medicines as told by your doctor.  Keep all doctor visits as told. GET HELP IF:  You see red streaks on the skin coming from the infected area.  Your red area gets bigger or turns a dark color.  Your bone or joint under the infected area is painful after the skin heals.  Your infection comes back in the same area or different area.  You have a puffy (swollen) bump in the infected area.  You have new symptoms.  You have a fever. GET HELP RIGHT AWAY IF:   You feel very sleepy.  You throw up (vomit) or have watery poop (diarrhea).  You feel sick and have muscle aches and pains. MAKE SURE YOU:   Understand these instructions.  Will watch your condition.  Will get help right away if you are not doing well or get worse. Document Released: 05/16/2008 Document Revised: 04/14/2014 Document Reviewed: 02/13/2012 [employer] Patient Information 2015 [place], [place]. This information is not intended to replace advice given to you by your health care provider. Make sure you discuss any questions you have with your health care provider.

## 2014-07-07 NOTE — Progress Notes (Signed)
Patient ID: Frederick Knight, male   DOB: [birth year], 63 y.o.   MRN: [id number]  [name], MD Phone: [phone]  Subjective:  Chief complaint-noted  Pt Here for follow up cellulitis  Patient states that since starting the antibiotic he is continued to feel gradually better today. He states that Sunday he had some bilateral leg cramps which resolved after a few hours. He still has an intermittent frontal headache which is slowly resolving also.  He denies any changes in his vision, problems walking, problems numbness or tingling, weakness He states that the area is not as tender as it was but is still slightly tender to the touch.  He took 2 old codeine on two separate occasions on Friday and Saturday for HA but hasnt needed one since.   He is chills have resolved He reports a normal appetite  ROS-  No fevers, chills Otherwise per HPI  Past Medical History Patient Active Problem List   Diagnosis Date Noted  . Cellulitis 07/03/2014  . Inflamed seborrheic keratosis of right cheek 01/30/2014  . Dyspnea 01/30/2014  . Dizziness 08/28/2013  . Hyperlipidemia 03/19/2013  . Hypertension 01/18/2013  . BPH (benign prostatic hyperplasia) 01/18/2013    Medications- reviewed and updated Current Outpatient Prescriptions  Medication Sig Dispense Refill  . doxycycline (VIBRA-TABS) 100 MG tablet Take 1 tablet (100 mg total) by mouth 2 (two) times daily.  20 tablet  0  . levETIRAcetam (KEPPRA) 500 MG tablet Take 1 tablet (500 mg total) by mouth 2 (two) times daily.  60 tablet  1  . rosuvastatin (CRESTOR) 10 MG tablet Take 1 tablet (10 mg total) by mouth daily.  30 tablet  11   No current facility-administered medications for this visit.    Objective: BP 112/62  Pulse 78  Temp(Src) 97.7 F (36.5 C) (Oral)  Resp 16 Gen: NAD, alert, cooperative with exam HEENT: NCAT, EOMI Ext: No edema, warm Neuro: Alert and oriented, No gross deficits, normal speech, normal gait.  Skin: Less  erythematous 2.5 cm X 1 cm erythematous area on L lat thoracic back with slightly larger crusted are in the central area. NO surrounding warmth, Tenderness to palpation, or induration.    Assessment/Plan:  Cellulitis Doing well, lesion less erythematous and systemic symptoms resolving Continue/finish antibiotics Red flags reviewed F/u PCP as needed HA may be sinus related, recommend nasal saline.

## 2014-07-07 NOTE — Assessment & Plan Note (Addendum)
Doing well, lesion less erythematous and systemic symptoms resolving Continue/finish antibiotics Red flags reviewed F/u PCP as needed HA may be sinus related, recommend nasal saline.

## 2014-07-23 ENCOUNTER — Encounter: Payer: Self-pay | Admitting: Family Medicine

## 2014-07-23 ENCOUNTER — Ambulatory Visit (INDEPENDENT_AMBULATORY_CARE_PROVIDER_SITE_OTHER): Payer: No Typology Code available for payment source | Admitting: Family Medicine

## 2014-07-23 VITALS — BP 143/75 | HR 76 | Ht 68.0 in | Wt 172.0 lb

## 2014-07-23 DIAGNOSIS — L309 Dermatitis, unspecified: Secondary | ICD-10-CM

## 2014-07-23 DIAGNOSIS — L259 Unspecified contact dermatitis, unspecified cause: Secondary | ICD-10-CM

## 2014-07-23 DIAGNOSIS — L820 Inflamed seborrheic keratosis: Secondary | ICD-10-CM | POA: Insufficient documentation

## 2014-07-23 MED ORDER — KETOCONAZOLE-HYDROCORTISONE 2 & 1 % (CREAM) EX KIT: 1.0000 | PACK | Freq: Two times a day (BID) | CUTANEOUS | Status: DC

## 2014-07-23 NOTE — Patient Instructions (Addendum)
You do not need to do anything special for your frozen lesions. They should get crusty and fall off over the next 1-2 weeks.  For the red patch, I am prescribing a cream. If it gets bigger or is not improving in the next few weeks please come back to see [place].

## 2014-07-23 NOTE — Assessment & Plan Note (Addendum)
Itching. - cryo x 5 on face, 3 on back, and 4 on scalp - repeat prn

## 2014-07-23 NOTE — Progress Notes (Signed)
   Subjective:    Patient ID: KORTEZ MURTAGH, male    DOB: 10-03-1951, 63 y.o.   MRN: [id number]  HPI Pt presents for eval of several skin lesions  He has many rough papules of various colors on his face and torso. They have been there for many years and seem to be more numerous every year. He has had 2 frozen before. They have now begun to itch. They are not painful or bleeding   Review of Systems See HPI    Objective:   Physical Exam  Nursing note and vitals reviewed. Constitutional: He is oriented to person, place, and time. He appears well-developed and well-nourished. No distress.  HENT:  Head: Normocephalic and atraumatic.  Eyes: Conjunctivae are normal. Right eye exhibits no discharge. Left eye exhibits no discharge. No scleral icterus.  Cardiovascular: Normal rate.   Pulmonary/Chest: Effort normal.  Neurological: He is alert and oriented to person, place, and time.  Skin: Skin is warm and dry. Lesion noted. No bruising, no ecchymosis, no laceration, no petechiae and no rash noted. He is not diaphoretic. No erythema. No pallor.     Psychiatric: He has a normal mood and affect. His behavior is normal.          Assessment & Plan:

## 2014-07-23 NOTE — Assessment & Plan Note (Signed)
Red patch on mid chest. Pityriasis herald patch vs. tinea corporis vs contact dermatitis (does not fit any of these well) - combined ketoconazole/hydrocortisone cream - rtc if not improving in a few weeks

## 2014-07-24 ENCOUNTER — Telehealth: Payer: Self-pay | Admitting: Family Medicine

## 2014-07-24 DIAGNOSIS — L309 Dermatitis, unspecified: Secondary | ICD-10-CM

## 2014-07-24 NOTE — Telephone Encounter (Signed)
Pt called and said that the pharmacy did not received the prescription from [place] yesterday. It show we e-scribed this on 8/12. Can we do this again please. jw

## 2014-07-25 MED ORDER — KETOCONAZOLE-HYDROCORTISONE 2 & 1 % (CREAM) EX KIT: 1.0000 | PACK | Freq: Two times a day (BID) | CUTANEOUS | Status: DC

## 2014-10-15 ENCOUNTER — Ambulatory Visit (INDEPENDENT_AMBULATORY_CARE_PROVIDER_SITE_OTHER): Payer: No Typology Code available for payment source | Admitting: Family Medicine

## 2014-10-15 ENCOUNTER — Encounter: Payer: Self-pay | Admitting: Family Medicine

## 2014-10-15 ENCOUNTER — Ambulatory Visit (INDEPENDENT_AMBULATORY_CARE_PROVIDER_SITE_OTHER): Payer: No Typology Code available for payment source | Admitting: *Deleted

## 2014-10-15 VITALS — BP 132/75 | HR 67 | Temp 97.8°F | Ht 68.0 in | Wt 175.0 lb

## 2014-10-15 DIAGNOSIS — Z1211 Encounter for screening for malignant neoplasm of colon: Secondary | ICD-10-CM

## 2014-10-15 DIAGNOSIS — Z1212 Encounter for screening for malignant neoplasm of rectum: Secondary | ICD-10-CM

## 2014-10-15 DIAGNOSIS — Z0000 Encounter for general adult medical examination without abnormal findings: Secondary | ICD-10-CM | POA: Insufficient documentation

## 2014-10-15 DIAGNOSIS — E785 Hyperlipidemia, unspecified: Secondary | ICD-10-CM

## 2014-10-15 DIAGNOSIS — N400 Enlarged prostate without lower urinary tract symptoms: Secondary | ICD-10-CM

## 2014-10-15 DIAGNOSIS — Z23 Encounter for immunization: Secondary | ICD-10-CM

## 2014-10-15 NOTE — Assessment & Plan Note (Addendum)
Stable, PSA last year was 1.51, no hx of urogenital CA or FHx of urogenital CA.  F/U in one year, can repeat PSA if pt desires.  As well consider UA w/ microscopy to evaluate for microscopic hematuria as well

## 2014-10-15 NOTE — Progress Notes (Signed)
Frederick Knight is a 63 y.o. male who presents today for check up.  Hyperlipidemia - Stable on Crestor, no myalgias.  Screening for Colorectal adenocarcinoma - Pt has orange card, has not had colonoscopy done in past due to cost issues.  Denies melena, hematochezia, but is having chronic suprapubic/lower quadrant abdominal discomfort, unchanged by meals, described as achy/feeling fullness.  No weight loss, fever, chills, or night sweats.    BPH - Stable, does not want medication at this point.   Past Medical History  Diagnosis Date  . Hyperlipemia     History  Smoking status  . Never Smoker   Smokeless tobacco  . Not on file    No family history on file.  Current Outpatient Prescriptions on File Prior to Visit  Medication Sig Dispense Refill  . Ketoconazole-Hydrocortisone 2 & 1 % (CREAM) KIT Apply 1 each topically 2 (two) times daily. 102 g 2  . levETIRAcetam (KEPPRA) 500 MG tablet Take 1 tablet (500 mg total) by mouth 2 (two) times daily. 60 tablet 1  . rosuvastatin (CRESTOR) 10 MG tablet Take 1 tablet (10 mg total) by mouth daily. 30 tablet 11   No current facility-administered medications on file prior to visit.    ROS: Per HPI.  All other systems reviewed and are negative.   Physical Exam Filed Vitals:   10/15/14 1403  BP: 132/75  Pulse: 67  Temp: 97.8 F (36.6 C)    Physical Examination: General appearance - alert, well appearing, and in no distress  Mental status - alert, oriented to person, place, and time  Extremities - no pedal edema noted  Lungs: CTA B/L Cardio: RRR Neck: No JVD Abdomen: Soft/NT/ND, NABS

## 2014-10-15 NOTE — Assessment & Plan Note (Signed)
Compliant with Crestor.  No myalgias, recommend considering Lipid and CMET in about 5-6 months.

## 2014-10-15 NOTE — Assessment & Plan Note (Signed)
Ideally would like to have pt to have colonoscopy but insurance has been an issue with getting this accomplished. - FOBT last yr with CT abdomen w/ contrast was negative - Seeing financial aid to have his orange card updated so he can get a colonoscopy in [place] having this done as soon as possible.   - Referral placed so it can be completed when paperwork is in.

## 2014-11-19 ENCOUNTER — Telehealth: Payer: Self-pay | Admitting: Family Medicine

## 2014-11-19 DIAGNOSIS — Z1211 Encounter for screening for malignant neoplasm of colon: Secondary | ICD-10-CM

## 2014-11-19 DIAGNOSIS — Z1212 Encounter for screening for malignant neoplasm of rectum: Principal | ICD-10-CM

## 2014-11-19 NOTE — Telephone Encounter (Signed)
Was suppose to have test because his stomach has been hurting for over a year. He now has the orange card to pay for it Wants to know if he can do that test. He doesn't know what the test is Please advise  Please call back on cell number [phone]

## 2014-11-20 NOTE — Telephone Encounter (Signed)
Referral in for GI for colonoscopy.  Pt has orange card and will need to go to [place].  Thanks, Tamela Oddi. Awanda Mink, DO of [facility] 11/20/2014, 1:11 PM

## 2014-12-12 HISTORY — PX: COLONOSCOPY: SHX174

## 2015-01-06 ENCOUNTER — Ambulatory Visit (INDEPENDENT_AMBULATORY_CARE_PROVIDER_SITE_OTHER): Payer: Self-pay | Admitting: Nurse Practitioner

## 2015-01-06 ENCOUNTER — Encounter: Payer: Self-pay | Admitting: Nurse Practitioner

## 2015-01-06 ENCOUNTER — Other Ambulatory Visit: Payer: Self-pay

## 2015-01-06 VITALS — BP 138/79 | HR 69 | Temp 98.4°F | Ht 66.0 in | Wt 175.4 lb

## 2015-01-06 DIAGNOSIS — Z1211 Encounter for screening for malignant neoplasm of colon: Secondary | ICD-10-CM

## 2015-01-06 DIAGNOSIS — R1030 Lower abdominal pain, unspecified: Secondary | ICD-10-CM

## 2015-01-06 DIAGNOSIS — R109 Unspecified abdominal pain: Secondary | ICD-10-CM | POA: Insufficient documentation

## 2015-01-06 MED ORDER — PEG 3350-KCL-NA BICARB-NACL 420 G PO SOLR: 4000.0000 mL | Freq: Once | ORAL | Status: DC

## 2015-01-06 NOTE — Progress Notes (Signed)
cc'ed to pcp °

## 2015-01-06 NOTE — Assessment & Plan Note (Addendum)
Patient with mild occasional lower abdominal tenderness, question significance. No red flag/warning signs (no bleeding, N/V, pain is mild). Possible dietary related or mild intermittent constipation but cannot rule out malignancy or other occult process.. CT in 2014 with no acute process. If TCS is normal can consider possible re-immaging and further workup.  Proceed with TCS with Dr. Gala Romney in near future: the risks, benefits, and alternatives have been discussed with the patient in detail. The patient states understanding and desires to proceed.

## 2015-01-06 NOTE — Assessment & Plan Note (Signed)
Has never had a colonoscopy before due to insurance issues. No red flag/warning symptoms like GI bleeding, weight loss, change in bowel habits, or change in appetite. Will plan for TCS in endoscopy, no on any anticoagulants, no ETOH or excessive medications.  Proceed with TCS with Dr. Gala Romney in near future: the risks, benefits, and alternatives have been discussed with the patient in detail. The patient states understanding and desires to proceed.

## 2015-01-06 NOTE — Patient Instructions (Signed)
1. We will schedule your procedure for you (colonoscopy) 2. Further recommendations pending results of the procedure.

## 2015-01-06 NOTE — Progress Notes (Signed)
Primary Care Physician:  Kennith Maes, DO Primary Gastroenterologist:  Dr. Gala Romney  Chief Complaint  Patient presents with  . Colonoscopy    HPI:   64 year old male presents on referral from PCP for screening colonoscopy. Patient seen in office rather than phone triage because he's having consistent abdominal pains for the past year but has not had insurance to have it worked-up until now. Has never had a colonoscopy before.   Is also complaining of lower abdominal pain about twice a week. Mostly occurs at night. Denies N/V. Has a bowel movement once a day. States his stool are #4 on the [facility] scale most of the time, occasionally a #3. Rarely has to strain. No feeling of incomplete emptying. Denies hematochezia or melena. Abdominal pain is unrelated to his bowel movement. Pain is usually mild, rated about 2-3/10 on the pain scale. Describes the pain as more annoying than anything else. Denies dysphagia. Rare GERD symptoms, "maybe once a month" related to spicy food and tomato sauce. Makes an attempt to avoid those. Avoids soft drinks because when he used to drink those they would trigger GERD symptoms. Denies any other upper or lower GI symptoms. Occasional NSAID use, maybe once every 1-2 months. Denies any ASA powder use.  Past Medical History  Diagnosis Date  . Hyperlipemia     Past Surgical History  Procedure Laterality Date  . Craniotomy  05/16/2012    Procedure: CRANIOTOMY HEMATOMA EVACUATION SUBDURAL;  Surgeon: Elaina Hoops, MD;  Location: [place] [facility];  Service: Neurosurgery;  Laterality: N/A;  Right burr holes for evacuation of subdural hematoma  . Craniotomy  05/18/2012    Procedure: CRANIOTOMY HEMATOMA EVACUATION SUBDURAL;  Surgeon: Elaina Hoops, MD;  Location: [place] [facility];  Service: Neurosurgery;  Laterality: Right;    Current Outpatient Prescriptions  Medication Sig Dispense Refill  . rosuvastatin (CRESTOR) 10 MG tablet Take 1 tablet (10 mg total) by mouth daily. 30 tablet 11    . Ketoconazole-Hydrocortisone 2 & 1 % (CREAM) KIT Apply 1 each topically 2 (two) times daily. (Patient not taking: Reported on 01/06/2015) 102 g 2  . levETIRAcetam (KEPPRA) 500 MG tablet Take 1 tablet (500 mg total) by mouth 2 (two) times daily. 60 tablet 1   No current facility-administered medications for this visit.    Allergies as of 01/06/2015  . (No Known Allergies)    No family history on file.  History   Social History  . Marital Status: Married    Spouse Name: N/A    Number of Children: N/A  . Years of Education: N/A   Occupational History  . Not on file.   Social History Main Topics  . Smoking status: Never Smoker   . Smokeless tobacco: Not on file  . Alcohol Use: No  . Drug Use: No  . Sexual Activity: Not on file   Other Topics Concern  . Not on file   Social History Narrative    Review of Systems: Gen: Denies any fever, chills, fatigue, weight loss, lack of appetite.  CV: Denies chest pain, heart palpitations, peripheral edema, syncope.  Resp: Denies shortness of breath at rest or with exertion. Denies wheezing or cough.  GI: See HPI. MS: Denies joint pain, muscle weakness, cramps, or limitation of movement.  Derm: Denies rash, itching, dry skin Psych: Denies depression, anxiety, memory loss, and confusion Heme: Denies bruising, bleeding, and enlarged lymph nodes.  Physical Exam: BP 138/79 mmHg  Pulse 69  Temp(Src) 98.4  F (36.9 C) (Oral)  Ht 5' 6" (1.676 m)  Wt 175 lb 6.4 oz (79.561 kg)  BMI 28.32 kg/m2 General:   Alert and oriented. Pleasant and cooperative. Well-nourished and well-developed.  Head:  Normocephalic and atraumatic. Eyes:  Without icterus, sclera clear and conjunctiva pink.  Ears:  Normal auditory acuity. Mouth:  No deformity or lesions, oral mucosa pink. No OP edema. Neck:  Supple, without mass or thyromegaly. Lungs:  Clear to auscultation bilaterally. No wheezes, rales, or rhonchi. No distress.  Heart:  S1, S2 present without  murmurs appreciated.  Abdomen:  +BS, soft, and non-distended. Mild TTP lower abdomen. No HSM noted. No guarding or rebound. No masses appreciated.  Rectal:  Deferred  Msk:  Symmetrical without gross deformities. Normal posture. Pulses:  Normal pulses noted. Extremities:  Without clubbing or edema. Neurologic:  Alert and  oriented x4;  grossly normal neurologically. Skin:  Intact without significant lesions or rashes. Cervical Nodes:  No significant cervical adenopathy. Psych:  Alert and cooperative. Normal mood and affect.     01/06/2015 1:49 PM  

## 2015-01-15 ENCOUNTER — Encounter (HOSPITAL_COMMUNITY): Payer: Self-pay

## 2015-01-15 ENCOUNTER — Encounter (HOSPITAL_COMMUNITY)
Admission: RE | Disposition: A | Discharge status: Home / Self Care | Payer: Self-pay | Source: Ambulatory Visit | Attending: Internal Medicine

## 2015-01-15 ENCOUNTER — Ambulatory Visit (HOSPITAL_COMMUNITY)
Admission: RE | Admit: 2015-01-15 | Discharge: 2015-01-15 | Disposition: A | Discharge status: Home / Self Care | Payer: Self-pay | Source: Ambulatory Visit | Attending: Internal Medicine | Admitting: Internal Medicine

## 2015-01-15 DIAGNOSIS — Z860100 Personal history of colon polyps, unspecified: Secondary | ICD-10-CM | POA: Insufficient documentation

## 2015-01-15 DIAGNOSIS — D122 Benign neoplasm of ascending colon: Secondary | ICD-10-CM | POA: Insufficient documentation

## 2015-01-15 DIAGNOSIS — E785 Hyperlipidemia, unspecified: Secondary | ICD-10-CM | POA: Insufficient documentation

## 2015-01-15 DIAGNOSIS — K621 Rectal polyp: Secondary | ICD-10-CM | POA: Insufficient documentation

## 2015-01-15 DIAGNOSIS — Z1211 Encounter for screening for malignant neoplasm of colon: Secondary | ICD-10-CM | POA: Insufficient documentation

## 2015-01-15 DIAGNOSIS — R109 Unspecified abdominal pain: Secondary | ICD-10-CM

## 2015-01-15 DIAGNOSIS — K635 Polyp of colon: Secondary | ICD-10-CM

## 2015-01-15 HISTORY — PX: COLONOSCOPY: SHX5424

## 2015-01-15 SURGERY — COLONOSCOPY
Anesthesia: Moderate Sedation

## 2015-01-15 MED ORDER — MEPERIDINE HCL 100 MG/ML IJ SOLN
INTRAMUSCULAR | Status: DC | PRN
  Administered 2015-01-15: 50 mg via INTRAVENOUS
  Administered 2015-01-15: 25 mg via INTRAVENOUS

## 2015-01-15 MED ORDER — SODIUM CHLORIDE 0.9 % IV SOLN
INTRAVENOUS | Status: DC
  Administered 2015-01-15: 14:00:00 via INTRAVENOUS

## 2015-01-15 MED ORDER — MIDAZOLAM HCL 5 MG/5ML IJ SOLN
INTRAMUSCULAR | Status: DC | PRN
  Administered 2015-01-15: 2 mg via INTRAVENOUS
  Administered 2015-01-15 (×2): 1 mg via INTRAVENOUS

## 2015-01-15 MED ORDER — MEPERIDINE HCL 100 MG/ML IJ SOLN
INTRAMUSCULAR | Status: AC
  Filled 2015-01-15: qty 2

## 2015-01-15 MED ORDER — MIDAZOLAM HCL 5 MG/5ML IJ SOLN
INTRAMUSCULAR | Status: AC
  Filled 2015-01-15: qty 10

## 2015-01-15 MED ORDER — STERILE WATER FOR IRRIGATION IR SOLN
Status: DC | PRN
  Administered 2015-01-15: 15:00:00

## 2015-01-15 MED ORDER — ONDANSETRON HCL 4 MG/2ML IJ SOLN
INTRAMUSCULAR | Status: DC | PRN
  Administered 2015-01-15: 4 mg via INTRAVENOUS

## 2015-01-15 MED ORDER — ONDANSETRON HCL 4 MG/2ML IJ SOLN
INTRAMUSCULAR | Status: AC
  Filled 2015-01-15: qty 2

## 2015-01-15 NOTE — H&P (View-Only) (Signed)
Primary Care Physician:  Kennith Maes, DO Primary Gastroenterologist:  Dr. Gala Romney  Chief Complaint  Patient presents with  . Colonoscopy    HPI:   64 year old male presents on referral from PCP for screening colonoscopy. Patient seen in office rather than phone triage because he's having consistent abdominal pains for the past year but has not had insurance to have it worked-up until now. Has never had a colonoscopy before.   Is also complaining of lower abdominal pain about twice a week. Mostly occurs at night. Denies N/V. Has a bowel movement once a day. States his stool are #4 on the [facility] scale most of the time, occasionally a #3. Rarely has to strain. No feeling of incomplete emptying. Denies hematochezia or melena. Abdominal pain is unrelated to his bowel movement. Pain is usually mild, rated about 2-3/10 on the pain scale. Describes the pain as more annoying than anything else. Denies dysphagia. Rare GERD symptoms, "maybe once a month" related to spicy food and tomato sauce. Makes an attempt to avoid those. Avoids soft drinks because when he used to drink those they would trigger GERD symptoms. Denies any other upper or lower GI symptoms. Occasional NSAID use, maybe once every 1-2 months. Denies any ASA powder use.  Past Medical History  Diagnosis Date  . Hyperlipemia     Past Surgical History  Procedure Laterality Date  . Craniotomy  05/16/2012    Procedure: CRANIOTOMY HEMATOMA EVACUATION SUBDURAL;  Surgeon: Elaina Hoops, MD;  Location: [place] [facility];  Service: Neurosurgery;  Laterality: N/A;  Right burr holes for evacuation of subdural hematoma  . Craniotomy  05/18/2012    Procedure: CRANIOTOMY HEMATOMA EVACUATION SUBDURAL;  Surgeon: Elaina Hoops, MD;  Location: [place] [facility];  Service: Neurosurgery;  Laterality: Right;    Current Outpatient Prescriptions  Medication Sig Dispense Refill  . rosuvastatin (CRESTOR) 10 MG tablet Take 1 tablet (10 mg total) by mouth daily. 30 tablet 11    . Ketoconazole-Hydrocortisone 2 & 1 % (CREAM) KIT Apply 1 each topically 2 (two) times daily. (Patient not taking: Reported on 01/06/2015) 102 g 2  . levETIRAcetam (KEPPRA) 500 MG tablet Take 1 tablet (500 mg total) by mouth 2 (two) times daily. 60 tablet 1   No current facility-administered medications for this visit.    Allergies as of 01/06/2015  . (No Known Allergies)    No family history on file.  History   Social History  . Marital Status: Married    Spouse Name: N/A    Number of Children: N/A  . Years of Education: N/A   Occupational History  . Not on file.   Social History Main Topics  . Smoking status: Never Smoker   . Smokeless tobacco: Not on file  . Alcohol Use: No  . Drug Use: No  . Sexual Activity: Not on file   Other Topics Concern  . Not on file   Social History Narrative    Review of Systems: Gen: Denies any fever, chills, fatigue, weight loss, lack of appetite.  CV: Denies chest pain, heart palpitations, peripheral edema, syncope.  Resp: Denies shortness of breath at rest or with exertion. Denies wheezing or cough.  GI: See HPI. MS: Denies joint pain, muscle weakness, cramps, or limitation of movement.  Derm: Denies rash, itching, dry skin Psych: Denies depression, anxiety, memory loss, and confusion Heme: Denies bruising, bleeding, and enlarged lymph nodes.  Physical Exam: BP 138/79 mmHg  Pulse 69  Temp(Src) 98.4  F (36.9 C) (Oral)  Ht 5' 6" (1.676 m)  Wt 175 lb 6.4 oz (79.561 kg)  BMI 28.32 kg/m2 General:   Alert and oriented. Pleasant and cooperative. Well-nourished and well-developed.  Head:  Normocephalic and atraumatic. Eyes:  Without icterus, sclera clear and conjunctiva pink.  Ears:  Normal auditory acuity. Mouth:  No deformity or lesions, oral mucosa pink. No OP edema. Neck:  Supple, without mass or thyromegaly. Lungs:  Clear to auscultation bilaterally. No wheezes, rales, or rhonchi. No distress.  Heart:  S1, S2 present without  murmurs appreciated.  Abdomen:  +BS, soft, and non-distended. Mild TTP lower abdomen. No HSM noted. No guarding or rebound. No masses appreciated.  Rectal:  Deferred  Msk:  Symmetrical without gross deformities. Normal posture. Pulses:  Normal pulses noted. Extremities:  Without clubbing or edema. Neurologic:  Alert and  oriented x4;  grossly normal neurologically. Skin:  Intact without significant lesions or rashes. Cervical Nodes:  No significant cervical adenopathy. Psych:  Alert and cooperative. Normal mood and affect.     01/06/2015 1:49 PM  

## 2015-01-15 NOTE — Discharge Instructions (Signed)
Colonoscopy Discharge Instructions  Read the instructions outlined below and refer to this sheet in the next few weeks. These discharge instructions provide you with general information on caring for yourself after you leave the hospital. Your doctor may also give you specific instructions. While your treatment has been planned according to the most current medical practices available, unavoidable complications occasionally occur. If you have any problems or questions after discharge, call Dr. Gala Romney at [phone]. ACTIVITY  You may resume your regular activity, but move at a slower pace for the next 24 hours.   Take frequent rest periods for the next 24 hours.   Walking will help get rid of the air and reduce the bloated feeling in your belly (abdomen).   No driving for 24 hours (because of the medicine (anesthesia) used during the test).    Do not sign any important legal documents or operate any machinery for 24 hours (because of the anesthesia used during the test).  NUTRITION  Drink plenty of fluids.   You may resume your normal diet as instructed by your doctor.   Begin with a light meal and progress to your normal diet. Heavy or fried foods are harder to digest and may make you feel sick to your stomach (nauseated).   Avoid alcoholic beverages for 24 hours or as instructed.  MEDICATIONS  You may resume your normal medications unless your doctor tells you otherwise.  WHAT YOU CAN EXPECT TODAY  Some feelings of bloating in the abdomen.   Passage of more gas than usual.   Spotting of blood in your stool or on the toilet paper.  IF YOU HAD POLYPS REMOVED DURING THE COLONOSCOPY:  No aspirin products for 7 days or as instructed.   No alcohol for 7 days or as instructed.   Eat a soft diet for the next 24 hours.  FINDING OUT THE RESULTS OF YOUR TEST Not all test results are available during your visit. If your test results are not back during the visit, make an appointment  with your caregiver to find out the results. Do not assume everything is normal if you have not heard from your caregiver or the medical facility. It is important for you to follow up on all of your test results.  SEEK IMMEDIATE MEDICAL ATTENTION IF:  You have more than a spotting of blood in your stool.   Your belly is swollen (abdominal distention).   You are nauseated or vomiting.   You have a temperature over 101.   You have abdominal pain or discomfort that is severe or gets worse throughout the day.    Polyp information provided  Further recommendations to follow pending review of pathology report  Office visit with [place] in one month  Colon Polyps Polyps are lumps of extra tissue growing inside the body. Polyps can grow in the large intestine (colon). Most colon polyps are noncancerous (benign). However, some colon polyps can become cancerous over time. Polyps that are larger than a pea may be harmful. To be safe, caregivers remove and test all polyps. CAUSES  Polyps form when mutations in the genes cause your cells to grow and divide even though no more tissue is needed. RISK FACTORS There are a number of risk factors that can increase your chances of getting colon polyps. They include:  Being older than 50 years.  Family history of colon polyps or colon cancer.  Long-term colon diseases, such as colitis or Crohn disease.  Being overweight.  Smoking.  Being inactive.  Drinking too much alcohol. SYMPTOMS  Most small polyps do not cause symptoms. If symptoms are present, they may include:  Blood in the stool. The stool may look dark red or black.  Constipation or diarrhea that lasts longer than 1 week. DIAGNOSIS People often do not know they have polyps until their caregiver finds them during a regular checkup. Your caregiver can use 4 tests to check for polyps:  Digital rectal exam. The caregiver wears gloves and feels inside the rectum. This test would find  polyps only in the rectum.  Barium enema. The caregiver puts a liquid called barium into your rectum before taking X-rays of your colon. Barium makes your colon look white. Polyps are dark, so they are easy to see in the X-ray pictures.  Sigmoidoscopy. A thin, flexible tube (sigmoidoscope) is placed into your rectum. The sigmoidoscope has a light and tiny camera in it. The caregiver uses the sigmoidoscope to look at the last third of your colon.  Colonoscopy. This test is like sigmoidoscopy, but the caregiver looks at the entire colon. This is the most common method for finding and removing polyps. TREATMENT  Any polyps will be removed during a sigmoidoscopy or colonoscopy. The polyps are then tested for cancer. PREVENTION  To help lower your risk of getting more colon polyps:  Eat plenty of fruits and vegetables. Avoid eating fatty foods.  Do not smoke.  Avoid drinking alcohol.  Exercise every day.  Lose weight if recommended by your caregiver.  Eat plenty of calcium and folate. Foods that are rich in calcium include milk, cheese, and broccoli. Foods that are rich in folate include chickpeas, kidney beans, and spinach. HOME CARE INSTRUCTIONS Keep all follow-up appointments as directed by your caregiver. You may need periodic exams to check for polyps. SEEK MEDICAL CARE IF: You notice bleeding during a bowel movement. Document Released: 08/24/2004 Document Revised: 02/20/2012 Document Reviewed: 02/07/2012 [facility] Patient Information 2015 [place], [place]. This information is not intended to replace advice given to you by your health care provider. Make sure you discuss any questions you have with your health care provider.

## 2015-01-15 NOTE — Op Note (Signed)
[place], [id number]   COLONOSCOPY PROCEDURE REPORT  PATIENT: Frederick Knight, Frederick Knight  MR#: [id number] BIRTHDATE: 02/22/1951 , 63  yrs. old GENDER: male ENDOSCOPIST: R.  Garfield Cornea, MD [facility] REFERRED BY:   Dr. Sena Slate PROCEDURE DATE:  31-Jan-2015 PROCEDURE:   Colonoscopy with biopsy and Colonoscopy with snare polypectomy INDICATIONS:First ever average risk colorectal cancer screening examination.Marland Kitchen MEDICATIONS: Versed 4 mg IV and Demerol 75 mg IV in divided doses. Zofran 4 mg IV. ASA CLASS:       Class II  CONSENT: The risks, benefits, alternatives and imponderables including but not limited to bleeding, perforation as well as the possibility of a missed lesion have been reviewed.  The potential for biopsy, lesion removal, etc. have also been discussed. Questions have been answered.  All parties agreeable.  Please see the history and physical in the medical record for more information.  DESCRIPTION OF PROCEDURE:   After the risks benefits and alternatives of the procedure were thoroughly explained, informed consent was obtained.  The digital rectal exam revealed no rectal mass.   The EC-[id number] ([id number])  endoscope was introduced through the anus and advanced to the cecum, which was identified by both the appendix and ileocecal valve. No adverse events experienced. The quality of the prep was adequate.  The instrument was then slowly withdrawn as the colon was fully examined.      COLON FINDINGS: (2) diminutive polyps in the distal rectum, 3 cm from the anal verge; otherwise, the remainder of the rectal mucosa appeared normal.  The patient had (1) 6 mm pedunculated polyp in the mid ascending segment; otherwise, the remaining colonic mucosa appeared normal.  The above-mentioned polyps were cold biopsy - removed and cold snare removed, respectively.  Retroflexion was performed. .  Withdrawal time=18 minutes 0 seconds.  The scope  was withdrawn and the procedure completed. COMPLICATIONS: There were no immediate complications.  ENDOSCOPIC IMPRESSION: Multiple polyps?"removed as described above  RECOMMENDATIONS: Follow-up pathology. Office visit with [place] in one month to further evaluate abdominal pain which is quite intermittent and not progressing  eSigned:  R. Garfield Cornea, MD [facility] 01-31-15 3:28 PM   cc:  CPT CODES: ICD CODES:  The ICD and CPT codes recommended by this software are interpretations from the data that the clinical staff has captured with the software.  The verification of the translation of this report to the ICD and CPT codes and modifiers is the sole responsibility of the health care institution and practicing physician where this report was generated.  [facility]. will not be held responsible for the validity of the ICD and CPT codes included on this report.  AMA assumes no liability for data contained or not contained herein. CPT is a Designer, television/film set of the [employer].  PATIENT NAME:  Frederick Knight, Frederick Knight MR#: [id number]

## 2015-01-15 NOTE — Interval H&P Note (Signed)
History and Physical Interval Note:  01/15/2015 2:31 PM  Frederick Knight  has presented today for surgery, with the diagnosis of abdominal pain, screening Colonoscopy  The various methods of treatment have been discussed with the patient and family. After consideration of risks, benefits and other options for treatment, the patient has consented to  Procedure(s) with comments: COLONOSCOPY (N/A) - 2:30pm as a surgical intervention .  The patient's history has been reviewed, patient examined, no change in status, stable for surgery.  I have reviewed the patient's chart and labs.  Questions were answered to the patient's satisfaction.     Frederick Knight  No abdominal pain today. His abdominal exam is completely benign. Screening colonoscopy per plan.The risks, benefits, limitations, alternatives and imponderables have been reviewed with the patient. Questions have been answered. All parties are agreeable.

## 2015-01-16 ENCOUNTER — Encounter (HOSPITAL_COMMUNITY): Payer: Self-pay | Admitting: Internal Medicine

## 2015-01-20 ENCOUNTER — Telehealth: Payer: Self-pay

## 2015-01-20 NOTE — Telephone Encounter (Signed)
Pt had procedure last week with RMR. States that he is going to the restroom but the amount is small. States he is still having abd pain. Pt states we can reach him via cell  [phone]

## 2015-01-21 NOTE — Telephone Encounter (Signed)
Have him try Miralax 17 gm three times a week to see if that helps with his intermittent abdominal pain and constipation. If he has severe symptoms, let us know. Also, make sure he has a 1 month post-colonoscopy follow-up as per Dr. Roseanne Kaufman op note. Thanks

## 2015-01-21 NOTE — Telephone Encounter (Signed)
Called and notified pt. He has follow up appt on 02/26/2015

## 2015-01-26 ENCOUNTER — Encounter: Payer: Self-pay | Admitting: Internal Medicine

## 2015-02-02 ENCOUNTER — Ambulatory Visit (INDEPENDENT_AMBULATORY_CARE_PROVIDER_SITE_OTHER): Payer: Self-pay | Admitting: Family Medicine

## 2015-02-02 ENCOUNTER — Encounter: Payer: Self-pay | Admitting: Family Medicine

## 2015-02-02 VITALS — BP 142/73 | HR 73 | Temp 98.3°F | Ht 68.5 in | Wt 172.7 lb

## 2015-02-02 DIAGNOSIS — I10 Essential (primary) hypertension: Secondary | ICD-10-CM

## 2015-02-02 DIAGNOSIS — N400 Enlarged prostate without lower urinary tract symptoms: Secondary | ICD-10-CM

## 2015-02-02 DIAGNOSIS — E785 Hyperlipidemia, unspecified: Secondary | ICD-10-CM

## 2015-02-02 LAB — LIPID PANEL
CHOLESTEROL: 128 mg/dL (ref 0–200)
HDL: 43 mg/dL (ref >=40)
LDL CALC: 68 mg/dL (ref 0–99)
TRIGLYCERIDES: 83 mg/dL (ref <150)
Total CHOL/HDL Ratio: 3 Ratio
VLDL: 17 mg/dL (ref 0–40)

## 2015-02-02 LAB — COMPREHENSIVE METABOLIC PANEL
ALBUMIN: 4.5 g/dL (ref 3.5–5.2)
ALK PHOS: 68 U/L (ref 39–117)
ALT: 26 U/L (ref 0–53)
AST: 23 U/L (ref 0–37)
BUN: 14 mg/dL (ref 6–23)
CALCIUM: 9.6 mg/dL (ref 8.4–10.5)
CHLORIDE: 106 meq/L (ref 96–112)
CO2: 26 mEq/L (ref 19–32)
Creat: 0.97 mg/dL (ref 0.50–1.35)
Glucose, Bld: 83 mg/dL (ref 70–99)
POTASSIUM: 4.3 meq/L (ref 3.5–5.3)
Sodium: 141 mEq/L (ref 135–145)
Total Bilirubin: 0.6 mg/dL (ref 0.2–1.2)
Total Protein: 7.3 g/dL (ref 6.0–8.3)

## 2015-02-02 NOTE — Assessment & Plan Note (Signed)
Stable, PSA last year was 1.51, no hx of urogenital CA or FHx of urogenital CA.  F/U in one year, can repeat PSA if pt desires.  As well consider UA w/ microscopy to evaluate for microscopic hematuria as well  - Consider trial of doxazosin (would help his BP too)

## 2015-02-02 NOTE — Assessment & Plan Note (Signed)
Bp well controlled today - CMET, CBC, Lipid Panel

## 2015-02-02 NOTE — Progress Notes (Signed)
Frederick Knight is a 64 y.o. male who presents today for f/u lab results.  PSA - Normal, continues to have BPH like Sx including incomplete voiding, increased urgency, polyuria, but denies weight loss or hematuria   HTN - well controlled, compliant, denies ADR  Hyperlipidemia - Well controlled on crestor, compliant, no myalgias.    Past Medical History  Diagnosis Date  . Hyperlipemia     Controlled well with medications    History  Smoking status  . Never Smoker   Smokeless tobacco  . Not on file    Family History  Problem Relation Age of Onset  . Colon cancer Neg Hx     Current Outpatient Prescriptions on File Prior to Visit  Medication Sig Dispense Refill  . polyethylene glycol-electrolytes (NULYTELY/GOLYTELY) 420 G solution Take 4,000 mLs by mouth once. 4000 mL 0  . rosuvastatin (CRESTOR) 10 MG tablet Take 1 tablet (10 mg total) by mouth daily. 30 tablet 11   No current facility-administered medications on file prior to visit.    ROS: Per HPI.  All other systems reviewed and are negative.   Physical Exam Filed Vitals:   02/02/15 1135  BP: 142/73  Pulse: 73  Temp: 98.3 F (36.8 C)    Physical Examination: General appearance - alert, well appearing, and in no distress  Mental status - alert, oriented to person, place, and time  Cardio: RRR Extremities - no pedal edema noted  Skin - normal coloration and turgor, no rashes, no suspicious skin lesions noted   Lab Results  Component Value Date   PSA 1.51 08/28/2013   PSA 1.23 04/19/2010

## 2015-02-02 NOTE — Assessment & Plan Note (Signed)
Compliant with Crestor.  No myalgias, recommend considering Lipid panel today

## 2015-02-03 LAB — CBC
HEMATOCRIT: 47.9 % (ref 39.0–52.0)
Hemoglobin: 16.1 g/dL (ref 13.0–17.0)
MCH: 30.1 pg (ref 26.0–34.0)
MCHC: 33.6 g/dL (ref 30.0–36.0)
MCV: 89.7 fL (ref 78.0–100.0)
MPV: 12.4 fL (ref 8.6–12.4)
Platelets: 115 10*3/uL — ABNORMAL LOW (ref 150–400)
RBC: 5.34 MIL/uL (ref 4.22–5.81)
RDW: 13.5 % (ref 11.5–15.5)
WBC: 6.6 10*3/uL (ref 4.0–10.5)

## 2015-02-10 ENCOUNTER — Other Ambulatory Visit: Payer: Self-pay | Admitting: Family Medicine

## 2015-02-10 MED ORDER — ROSUVASTATIN CALCIUM 10 MG PO TABS: 10.0000 mg | ORAL_TABLET | Freq: Every day | ORAL | Status: DC

## 2015-02-16 ENCOUNTER — Telehealth: Payer: Self-pay | Admitting: General Practice

## 2015-02-16 ENCOUNTER — Ambulatory Visit: Payer: Self-pay | Admitting: Nurse Practitioner

## 2015-02-16 NOTE — Telephone Encounter (Signed)
I tried to call the patient and give him the above information, however there was no answer, lmom

## 2015-02-16 NOTE — Telephone Encounter (Signed)
I called and spoke with [facility] at [facility] and she stated the invoice number# [id number] patient.  He will need to call [phone] and he can apply by phone for assistance.  According to [facility] as of 01/12/15 they no longer accept the indigent forms.  The patient must call and apply directly with [employer] for financial aid or he can get his ordering physician, Dr Gwendlyn Deutscher to call and request a reduction, his bill will be reduced from $151 to $86.94.

## 2015-02-26 ENCOUNTER — Ambulatory Visit (INDEPENDENT_AMBULATORY_CARE_PROVIDER_SITE_OTHER): Payer: Self-pay | Admitting: Nurse Practitioner

## 2015-02-26 ENCOUNTER — Encounter: Payer: Self-pay | Admitting: Nurse Practitioner

## 2015-02-26 VITALS — BP 131/71 | HR 68 | Temp 98.0°F | Ht 67.0 in | Wt 172.0 lb

## 2015-02-26 DIAGNOSIS — R1030 Lower abdominal pain, unspecified: Secondary | ICD-10-CM

## 2015-02-26 NOTE — Patient Instructions (Addendum)
1. There is no need to follow-up routinely at this point 2. Call us if you have any more problems and we can make you an appointment at that time 3. We will make sure you're on the list to get a reminder letter in 5 years when it's time to have another colonoscopy 4. Take Miralax as needed for constipation

## 2015-02-26 NOTE — Progress Notes (Signed)
Referring Provider: Nolon Rod, DO Primary Care Physician:  Kennith Maes, DO Primary GI: Dr. Gala Romney  Chief Complaint  Patient presents with  . Follow-up    HPI:   64 year old male presents for follow-up visit for abdominal pain after colonoscopy. Colonoscopy results showed 2 diminutive polyps in the distal rectum, one 6 mm pedunculated polyp in the mid ascending colon segment, remaining colonic mucosa appeared normal. Pathology results of the polyps that were removed showed 1 from the ascending colon as tubular adenoma, and a rectal polyps both hyperplastic. Recommended follow-up for repeat exam in 5 years.. Called about 1 week after procedure stating he was having small bowel movements and continued abdominal pain, recommended Miralax three times a week.  Today he states sometimes he feels bloated, has occasional morning fecal urgency. Miralax helps, it only taking it prn rather than scheduled. Has a bowel movement daily, normal amount, soft without straining. The abdominal pain is resolved. Doesn't want to take Miralax regularly.   Past Medical History  Diagnosis Date  . Hyperlipemia     Controlled well with medications    Past Surgical History  Procedure Laterality Date  . Craniotomy  05/16/2012    Procedure: CRANIOTOMY HEMATOMA EVACUATION SUBDURAL;  Surgeon: Elaina Hoops, MD;  Location: [facility] [facility];  Service: Neurosurgery;  Laterality: N/A;  Right burr holes for evacuation of subdural hematoma  . Craniotomy  05/18/2012    Procedure: CRANIOTOMY HEMATOMA EVACUATION SUBDURAL;  Surgeon: Elaina Hoops, MD;  Location: [facility];  Service: Neurosurgery;  Laterality: Right;  . Colonoscopy N/A 01/15/2015    RMR:  Multiple polyps removed as described above.    Current Outpatient Prescriptions  Medication Sig Dispense Refill  . rosuvastatin (CRESTOR) 10 MG tablet Take 1 tablet (10 mg total) by mouth daily. 30 tablet 11   No current facility-administered medications for this visit.     Allergies as of 02/26/2015  . (No Known Allergies)    Family History  Problem Relation Age of Onset  . Colon cancer Neg Hx     History   Social History  . Marital Status: Married    Spouse Name: N/A  . Number of Children: N/A  . Years of Education: N/A   Social History Main Topics  . Smoking status: Never Smoker   . Smokeless tobacco: Not on file  . Alcohol Use: No     Comment: Used to drink socially; Does not drink anymore.  . Drug Use: No  . Sexual Activity: Not on file   Other Topics Concern  . None   Social History Narrative    Review of Systems: Gen: Denies fever, chills, anorexia. Denies fatigue, weakness, weight loss.  CV: Denies chest pain, palpitations, syncope, peripheral edema. Resp: Denies dyspnea at rest, wheezing. GI: See history of present illness. Denies vomiting blood, jaundice.   Denies dysphagia or odynophagia. Derm: Denies rash, itching, dry skin Psych: Denies depression, anxiety, memory loss, confusion.  Heme: Denies bruising, bleeding, and enlarged lymph nodes.  Physical Exam: BP 131/71 mmHg  Pulse 68  Temp(Src) 98 F (36.7 C)  Ht 5\' 7"  (1.702 m)  Wt 172 lb (78.019 kg)  BMI 26.93 kg/m2 General:   Alert and oriented. No distress noted. Pleasant and cooperative.  Head:  Normocephalic and atraumatic. Eyes:  Conjuctiva clear without scleral icterus. Lungs:  Clear to auscultation bilaterally. No wheezes, rales, or rhonchi. No distress.  Heart:  S1, S2 present without murmurs, rubs, or gallops. Regular  rate and rhythm. Abdomen:  +BS, soft, non-tender and non-distended. No rebound or guarding. No HSM or masses noted. Msk:  Symmetrical without gross deformities. Normal posture. Extremities:  Without edema. Neurologic:  Alert and  oriented x4;  grossly normal neurologically. Skin:  Intact without significant lesions or rashes. Psych:  Alert and cooperative. Normal mood and affect.    02/26/2015 11:07 AM

## 2015-02-26 NOTE — Assessment & Plan Note (Signed)
64 year old male presents for follow-up of abdominal pain after Frederick Knight previous workup and colonoscopy. Since the colonoscopy he has had a complete resolution of his abdominal pain symptoms. He did call in approximately 1 week after the colonoscopy to complain of bowel movements that were smaller than normal, consistent with constipation. He was advised to attend MiraLAX up to 3 times a week which she could increase to once a day as needed for increased stool bulk and regularity. This treatment has worked well for him, as he is no longer having any constipation and continues to take MiraLAX on an as-needed basis. At this point with resolution of symptoms and colonoscopy requiring five-year repeat exam, was advised to return as needed for any symptom return or additional GI related symptoms. We'll place on the recall list for 5 year repeat colonoscopy.

## 2015-02-27 NOTE — Telephone Encounter (Signed)
I called the patient and left message for him to call customer service at the number listed below.

## 2015-03-03 NOTE — Progress Notes (Signed)
cc'ed to pcp °

## 2015-03-13 ENCOUNTER — Encounter: Payer: Self-pay | Admitting: Family Medicine

## 2015-03-13 ENCOUNTER — Ambulatory Visit (INDEPENDENT_AMBULATORY_CARE_PROVIDER_SITE_OTHER): Payer: Self-pay | Admitting: Family Medicine

## 2015-03-13 VITALS — BP 125/79 | HR 81 | Temp 97.9°F | Ht 67.0 in | Wt 170.1 lb

## 2015-03-13 DIAGNOSIS — J069 Acute upper respiratory infection, unspecified: Secondary | ICD-10-CM | POA: Insufficient documentation

## 2015-03-13 DIAGNOSIS — J302 Other seasonal allergic rhinitis: Secondary | ICD-10-CM

## 2015-03-13 MED ORDER — FLUTICASONE PROPIONATE 50 MCG/ACT NA SUSP: 2.0000 | Freq: Every day | NASAL | Status: DC

## 2015-03-13 NOTE — Assessment & Plan Note (Signed)
Patient with signs and symptoms of acute upper respiratory infection, likely begun with seasonal allergies. No red flags on exam today. Patient's symptoms started 5 days ago. Encouraged him to do symptomatic treatment for headache, rest, fluids, Mucinex and Flonase. Past with patient the difference between viral and bacterial etiologies, and encouraged him to return if he is not feeling better by next Thursday or Friday,. Patient understood and was in agreement. Follow-up as needed

## 2015-03-13 NOTE — Assessment & Plan Note (Signed)
Patient has signs and symptoms of upper respiratory infection, likely kicked off by seasonal allergies. Prescribe Flonase for seasonal allergies. Encouraged patient to use it for the next 4-8 weeks during high allergy season. Follow up as needed

## 2015-03-13 NOTE — Progress Notes (Addendum)
   Subjective:    Patient ID: KINNICK MAUS, male    DOB: 1951/04/24, 64 y.o.   MRN: [id number]  HPI  Cough: Patient presents to family medicine clinic with a five-day history of dry cough, headache, and rhinorrhea. He reports he is now mildly congested as well. He admits to some chills and diarrhea early on in the illness, but the current fevers, myalgias or diarrhea. He has been taken Robitussin-DM, with good results. He denies any nausea or vomiting and he is eating and drinking well. He does admit he had similar symptoms last year, and questions if he doesn't have seasonal allergies. No sick contacts, but was around his grandchildren over the weekend and he is a Pharmacist, hospital.  Never smoker Past Medical History  Diagnosis Date  . Hyperlipemia     Controlled well with medications   No Known Allergies  Review of Systems Per HPI    Objective:   Physical Exam BP 125/79 mmHg  Pulse 81  Temp(Src) 97.9 F (36.6 C) (Oral)  Ht 5\' 7"  (1.702 m)  Wt 170 lb 1.6 oz (77.157 kg)  BMI 26.64 kg/m2 Gen: NAD. Nontoxic in appearance, well-developed, well-nourished, very pleasant male. HEENT: AT. [place]. Bilateral TM unable to be visualized due to bilateral cerumen impaction. Bilateral eyes without injections or icterus. MMM. Bilateral nares mild erythema and swelling. Throat without erythema or exudates. Cobblestoning present. CV: RRR  Chest: CTAB, no wheeze or crackles Abd: Soft. Flat. NTND. BS present. No Masses palpated.       Assessment & Plan:

## 2015-03-13 NOTE — Patient Instructions (Signed)
Upper Respiratory Infection, Adult An upper respiratory infection (URI) is also sometimes known as the common cold. The upper respiratory tract includes the nose, sinuses, throat, trachea, and bronchi. Bronchi are the airways leading to the lungs. Most people improve within 1 week, but symptoms can last up to 2 weeks. A residual cough may last even longer.  CAUSES Many different viruses can infect the tissues lining the upper respiratory tract. The tissues become irritated and inflamed and often become very moist. Mucus production is also common. A cold is contagious. You can easily spread the virus to others by oral contact. This includes kissing, sharing a glass, coughing, or sneezing. Touching your mouth or nose and then touching a surface, which is then touched by another person, can also spread the virus. SYMPTOMS  Symptoms typically develop 1 to 3 days after you come in contact with a cold virus. Symptoms vary from person to person. They may include:  Runny nose.  Sneezing.  Nasal congestion.  Sinus irritation.  Sore throat.  Loss of voice (laryngitis).  Cough.  Fatigue.  Muscle aches.  Loss of appetite.  Headache.  Low-grade fever. DIAGNOSIS  You might diagnose your own cold based on familiar symptoms, since most people get a cold 2 to 3 times a year. Your caregiver can confirm this based on your exam. Most importantly, your caregiver can check that your symptoms are not due to another disease such as strep throat, sinusitis, pneumonia, asthma, or epiglottitis. Blood tests, throat tests, and X-rays are not necessary to diagnose a common cold, but they may sometimes be helpful in excluding other more serious diseases. Your caregiver will decide if any further tests are required. RISKS AND COMPLICATIONS  You may be at risk for a more severe case of the common cold if you smoke cigarettes, have chronic heart disease (such as heart failure) or lung disease (such as asthma), or if  you have a weakened immune system. The very young and very old are also at risk for more serious infections. Bacterial sinusitis, middle ear infections, and bacterial pneumonia can complicate the common cold. The common cold can worsen asthma and chronic obstructive pulmonary disease (COPD). Sometimes, these complications can require emergency medical care and may be life-threatening. PREVENTION  The best way to protect against getting a cold is to practice good hygiene. Avoid oral or hand contact with people with cold symptoms. Wash your hands often if contact occurs. There is no clear evidence that vitamin C, vitamin E, echinacea, or exercise reduces the chance of developing a cold. However, it is always recommended to get plenty of rest and practice good nutrition. TREATMENT  Treatment is directed at relieving symptoms. There is no cure. Antibiotics are not effective, because the infection is caused by a virus, not by bacteria. Treatment may include:  Increased fluid intake. Sports drinks offer valuable electrolytes, sugars, and fluids.  Breathing heated mist or steam (vaporizer or shower).  Eating chicken soup or other clear broths, and maintaining good nutrition.  Getting plenty of rest.  Using gargles or lozenges for comfort.  Controlling fevers with ibuprofen or acetaminophen as directed by your caregiver.  Increasing usage of your inhaler if you have asthma. Zinc gel and zinc lozenges, taken in the first 24 hours of the common cold, can shorten the duration and lessen the severity of symptoms. Pain medicines may help with fever, muscle aches, and throat pain. A variety of non-prescription medicines are available to treat congestion and runny nose. Your caregiver   can make recommendations and may suggest nasal or lung inhalers for other symptoms.  HOME CARE INSTRUCTIONS   Only take over-the-counter or prescription medicines for pain, discomfort, or fever as directed by your  caregiver.  Use a warm mist humidifier or inhale steam from a shower to increase air moisture. This may keep secretions moist and make it easier to breathe.  Drink enough water and fluids to keep your urine clear or pale yellow.  Rest as needed.  Return to work when your temperature has returned to normal or as your caregiver advises. You may need to stay home longer to avoid infecting others. You can also use a face mask and careful hand washing to prevent spread of the virus. SEEK MEDICAL CARE IF:   After the first few days, you feel you are getting worse rather than better.  You need your caregiver's advice about medicines to control symptoms.  You develop chills, worsening shortness of breath, or brown or red sputum. These may be signs of pneumonia.  You develop yellow or brown nasal discharge or pain in the face, especially when you bend forward. These may be signs of sinusitis.  You develop a fever, swollen neck glands, pain with swallowing, or white areas in the back of your throat. These may be signs of strep throat. SEEK IMMEDIATE MEDICAL CARE IF:   You have a fever.  You develop severe or persistent headache, ear pain, sinus pain, or chest pain.  You develop wheezing, a prolonged cough, cough up blood, or have a change in your usual mucus (if you have chronic lung disease).  You develop sore muscles or a stiff neck. Document Released: 05/24/2001 Document Revised: 02/20/2012 Document Reviewed: 03/05/2014 [facility] Patient Information 2015 [place], [place]. This information is not intended to replace advice given to you by your health care provider. Make sure you discuss any questions you have with your health care provider.   I would like you to start a medication called Flonase, some of your symptoms are attributed to allergies. You will take this to puffs in your nose once a day. I would do this for the next 30-60 days doing high allergy season. To help treat your upper  respiratory infection, it is likely from a virus. I suggest you get plenty of rest, drink plenty of water, take Advil or Tylenol for any headaches. If you are not feeling better by next Thursday or you get better and then become worse, this is signs of your virus going into a bacterial infection, and you would need to be seen again at that time. It was a pleasure meeting you today.

## 2016-02-25 ENCOUNTER — Other Ambulatory Visit: Payer: Self-pay | Admitting: Cardiovascular Disease

## 2016-02-25 DIAGNOSIS — Z9889 Other specified postprocedural states: Secondary | ICD-10-CM

## 2016-02-25 DIAGNOSIS — R42 Dizziness and giddiness: Secondary | ICD-10-CM

## 2016-02-29 ENCOUNTER — Inpatient Hospital Stay: Admission: RE | Admit: 2016-02-29 | Payer: Self-pay | Source: Ambulatory Visit

## 2016-03-03 ENCOUNTER — Ambulatory Visit
Admission: RE | Admit: 2016-03-03 | Discharge: 2016-03-03 | Disposition: A | Discharge status: Home / Self Care | Payer: Medicaid Other | Source: Ambulatory Visit | Attending: Cardiovascular Disease | Admitting: Cardiovascular Disease

## 2016-03-03 DIAGNOSIS — R42 Dizziness and giddiness: Secondary | ICD-10-CM | POA: Diagnosis not present

## 2016-03-03 DIAGNOSIS — Z9889 Other specified postprocedural states: Secondary | ICD-10-CM

## 2016-04-01 ENCOUNTER — Other Ambulatory Visit: Payer: Self-pay | Admitting: Cardiovascular Disease

## 2016-04-01 DIAGNOSIS — R1084 Generalized abdominal pain: Secondary | ICD-10-CM

## 2016-04-11 ENCOUNTER — Ambulatory Visit
Admission: RE | Admit: 2016-04-11 | Discharge: 2016-04-11 | Disposition: A | Discharge status: Home / Self Care | Payer: Medicaid Other | Source: Ambulatory Visit | Attending: Cardiovascular Disease | Admitting: Cardiovascular Disease

## 2016-04-11 DIAGNOSIS — R1084 Generalized abdominal pain: Secondary | ICD-10-CM

## 2016-04-11 DIAGNOSIS — R109 Unspecified abdominal pain: Secondary | ICD-10-CM | POA: Diagnosis not present

## 2016-06-27 DIAGNOSIS — H2513 Age-related nuclear cataract, bilateral: Secondary | ICD-10-CM | POA: Diagnosis not present

## 2016-06-27 DIAGNOSIS — H40033 Anatomical narrow angle, bilateral: Secondary | ICD-10-CM | POA: Diagnosis not present

## 2016-06-27 DIAGNOSIS — H02831 Dermatochalasis of right upper eyelid: Secondary | ICD-10-CM | POA: Diagnosis not present

## 2016-06-27 DIAGNOSIS — H16223 Keratoconjunctivitis sicca, not specified as Sjogren's, bilateral: Secondary | ICD-10-CM | POA: Diagnosis not present

## 2016-06-27 DIAGNOSIS — H40013 Open angle with borderline findings, low risk, bilateral: Secondary | ICD-10-CM | POA: Diagnosis not present

## 2016-06-27 DIAGNOSIS — H02834 Dermatochalasis of left upper eyelid: Secondary | ICD-10-CM | POA: Diagnosis not present

## 2016-08-31 ENCOUNTER — Ambulatory Visit: Payer: Medicare Other | Admitting: Family Medicine

## 2016-09-09 ENCOUNTER — Ambulatory Visit (INDEPENDENT_AMBULATORY_CARE_PROVIDER_SITE_OTHER): Payer: Medicare Other | Admitting: Family Medicine

## 2016-09-09 VITALS — BP 144/80 | HR 76 | Temp 98.5°F | Ht 67.0 in | Wt 172.2 lb

## 2016-09-09 DIAGNOSIS — R42 Dizziness and giddiness: Secondary | ICD-10-CM

## 2016-09-09 DIAGNOSIS — Z23 Encounter for immunization: Secondary | ICD-10-CM

## 2016-09-09 DIAGNOSIS — N400 Enlarged prostate without lower urinary tract symptoms: Secondary | ICD-10-CM | POA: Diagnosis present

## 2016-09-09 DIAGNOSIS — N401 Enlarged prostate with lower urinary tract symptoms: Secondary | ICD-10-CM | POA: Diagnosis not present

## 2016-09-09 DIAGNOSIS — R3915 Urgency of urination: Secondary | ICD-10-CM | POA: Diagnosis not present

## 2016-09-09 DIAGNOSIS — Z125 Encounter for screening for malignant neoplasm of prostate: Secondary | ICD-10-CM | POA: Diagnosis not present

## 2016-09-09 MED ORDER — MECLIZINE HCL 12.5 MG PO TABS: 12.5000 mg | ORAL_TABLET | Freq: Three times a day (TID) | ORAL | 0 refills | Status: DC | PRN

## 2016-09-09 NOTE — Patient Instructions (Addendum)
Thank you for coming in today, it was so nice to see you! Today we talked about:    Dizziness: Take a tablet of Meclizine every 6 hours while you feel dizzy  Urine problems: we will check your prostate level today (PSA)  Please follow up in 1-3 weeks. You can schedule this appointment at the front desk before you leave or call the clinic.  Bring in all your medications or supplements to each appointment for review.   If we ordered any tests today, you will be notified via telephone of any abnormalities. If everything is normal you will get a letter in the mail.   If you have any questions or concerns, please do not hesitate to call the office at [phone]. You can also message me directly via MyChart.   Sincerely,  Smitty Cords, MD    Dizziness Dizziness is a common problem. It is a feeling of unsteadiness or light-headedness. You may feel like you are about to faint. Dizziness can lead to injury if you stumble or fall. Anyone can become dizzy, but dizziness is more common in older adults. This condition can be caused by a number of things, including medicines, dehydration, or illness. HOME CARE INSTRUCTIONS Taking these steps may help with your condition: Eating and Drinking  Drink enough fluid to keep your urine clear or pale yellow. This helps to keep you from becoming dehydrated. Try to drink more clear fluids, such as water.  Do not drink alcohol.  Limit your caffeine intake if directed by your health care provider.  Limit your salt intake if directed by your health care provider. Activity  Avoid making quick movements.  Rise slowly from chairs and steady yourself until you feel okay.  In the morning, first sit up on the side of the bed. When you feel okay, stand slowly while you hold onto something until you know that your balance is fine.  Move your legs often if you need to stand in one place for a long time. Tighten and relax your muscles in your legs while  you are standing.  Do not drive or operate heavy machinery if you feel dizzy.  Avoid bending down if you feel dizzy. Place items in your home so that they are easy for you to reach without leaning over. Lifestyle  Do not use any tobacco products, including cigarettes, chewing tobacco, or electronic cigarettes. If you need help quitting, ask your health care provider.  Try to reduce your stress level, such as with yoga or meditation. Talk with your health care provider if you need help. General Instructions  Watch your dizziness for any changes.  Take medicines only as directed by your health care provider. Talk with your health care provider if you think that your dizziness is caused by a medicine that you are taking.  Tell a friend or a family member that you are feeling dizzy. If he or she notices any changes in your behavior, have this person call your health care provider.  Keep all follow-up visits as directed by your health care provider. This is important. SEEK MEDICAL CARE IF:  Your dizziness does not go away.  Your dizziness or light-headedness gets worse.  You feel nauseous.  You have reduced hearing.  You have new symptoms.  You are unsteady on your feet or you feel like the room is spinning. SEEK IMMEDIATE MEDICAL CARE IF:  You vomit or have diarrhea and are unable to eat or drink anything.  You have  problems talking, walking, swallowing, or using your arms, hands, or legs.  You feel generally weak.  You are not thinking clearly or you have trouble forming sentences. It may take a friend or family member to notice this.  You have chest pain, abdominal pain, shortness of breath, or sweating.  Your vision changes.  You notice any bleeding.  You have a headache.  You have neck pain or a stiff neck.  You have a fever.   This information is not intended to replace advice given to you by your health care provider. Make sure you discuss any questions you  have with your health care provider.   Document Released: 05/24/2001 Document Revised: 04/14/2015 Document Reviewed: 11/24/2014 Elsevier Interactive Patient Education [employer].

## 2016-09-09 NOTE — Progress Notes (Signed)
Subjective:    Patient ID: Frederick Knight , male   DOB: Aug 15, 1951 , 65 y.o..   MRN: [id number]  HPI  Frederick Knight is here for  Chief Complaint  Patient presents with  . Annual Exam   Discussed that this is not an annual exam and he will need to reschedule for a full 30 minute slot but will adress some of his concerns today.  Dizziness: Patient states that he sometimes has dizziness when he feels like the room is spinning. This has been going on for years.These episodes can last for about 1 minutes and occur a couple times a week. He has never lost consciousness or passed out. Denies any nausea, vomiting, palpitations, pelvic pain, back pain, chest pain, shortness of breath, hearing loss, tinnitus, or headache.   Urine issues:  Patient has known BPH. States he sometimes experiences the sudden urge to go to the bathroom, has never fully urinated on himself but sometimes accidentally dribbles on the way to the toilet. Denies any trouble starting urine and thinks he completely empties his bladder when he voids. Does not wear adult diapers. No bowel incontinence.    Review of Systems: Per HPI. All other systems reviewed and are negative.  Health Maintenance Due  Topic Date Due  . Hepatitis C Screening  Apr 07, 1951  . ZOSTAVAX  01/25/2010  . PNA vac Low Risk Adult (1 of 2 - PCV13) 01/25/2015    Past Medical History: Patient Active Problem List   Diagnosis Date Noted  . Seasonal allergies 03/13/2015  . History of colonic polyps   . Abdominal pain 01/06/2015  . Dizziness 08/28/2013  . Hyperlipidemia 03/19/2013  . Hypertension 01/18/2013  . BPH (benign prostatic hyperplasia) 01/18/2013    Medications: reviewed and updated Current Outpatient Prescriptions  Medication Sig Dispense Refill  . fluticasone (FLONASE) 50 MCG/ACT nasal spray Place 2 sprays into both nostrils daily. 16 g 6  . meclizine (ANTIVERT) 12.5 MG tablet Take 1 tablet (12.5 mg total) by mouth 3 (three) times  daily as needed for dizziness. 30 tablet 0  . rosuvastatin (CRESTOR) 10 MG tablet Take 1 tablet (10 mg total) by mouth daily. 30 tablet 11   No current facility-administered medications for this visit.     Social Hx:  reports that he has never smoked. He does not have any smokeless tobacco history on file.    Objective:   BP (!) 144/80   Pulse 76   Temp 98.5 F (36.9 C) (Oral)   Ht 5\' 7"  (1.702 m)   Wt 172 lb 3.2 oz (78.1 kg)   BMI 26.97 kg/m  Physical Exam  Gen: NAD, alert, cooperative with exam, well-appearing HEENT: NCAT, PERRL, clear conjunctiva, oropharynx clear, supple neck, TM normal bilaterally Cardiac: Regular rate and rhythm, normal S1/S2, no murmur, no edema, capillary refill brisk  Respiratory: Clear to auscultation bilaterally, no wheezes, non-labored breathing Gastrointestinal: soft, non tender, non distended, bowel sounds present Rectal exam: no tenderness noted, sphincter tone normal PROSTATE EXAM: smooth and symmetric without nodules or tenderness, slightly enlarged Neuro: Alert and oriented, CN 2-12 intact, 5/5 strength in bilateral upper and lower extremities, sensations grossly intact throughout, normal gait, negative romberg Psych: good insight, normal mood and affect   Assessment & Plan:  BPH (benign prostatic hyperplasia) With urinary urgency. Enlarged smooth prostate on exam today. Has never had abnormal PSA (last one was 1.51 in 2014). Discussed ordering PSA to screen for prostate cancer, patient agreeable. Also discussed starting medication such  as finasteride, patient does not want to try at this time. Will follow up as needed.   Dizziness Continues to have dizziness, has been a chronic problem for years. No neurological deficits on exam today and no red flag symptoms. CT head was ordered on 09/2013 and did not show any acute process. Do not feel strongly about repeat head imaging at this time. 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 Will follow up if no improvement with the meclizine. Red flag symptoms and return precautions discussed.    Smitty Cords, MD [place], PGY-2

## 2016-09-12 NOTE — Assessment & Plan Note (Signed)
Continues to have dizziness, has been a chronic problem for years. No neurological deficits on exam today and no red flag symptoms. CT head was ordered on 09/2013 and did not show any acute process. Do not feel strongly about repeat head imaging at this time. 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 Will follow up if no improvement with the meclizine. Red flag symptoms and return precautions discussed.

## 2016-09-12 NOTE — Assessment & Plan Note (Signed)
With urinary urgency. Enlarged smooth prostate on exam today. Has never had abnormal PSA (last one was 1.51 in 2014). Discussed ordering PSA to screen for prostate cancer, patient agreeable. Also discussed starting medication such as finasteride, patient does not want to try at this time. Will follow up as needed.

## 2016-10-05 ENCOUNTER — Ambulatory Visit (INDEPENDENT_AMBULATORY_CARE_PROVIDER_SITE_OTHER): Payer: Medicare Other | Admitting: Family Medicine

## 2016-10-05 VITALS — BP 141/71 | HR 66 | Temp 98.3°F | Ht 67.0 in | Wt 173.2 lb

## 2016-10-05 DIAGNOSIS — Z0000 Encounter for general adult medical examination without abnormal findings: Secondary | ICD-10-CM

## 2016-10-05 DIAGNOSIS — Z23 Encounter for immunization: Secondary | ICD-10-CM

## 2016-10-05 DIAGNOSIS — N281 Cyst of kidney, acquired: Secondary | ICD-10-CM

## 2016-10-05 DIAGNOSIS — R1030 Lower abdominal pain, unspecified: Secondary | ICD-10-CM

## 2016-10-05 DIAGNOSIS — R7309 Other abnormal glucose: Secondary | ICD-10-CM | POA: Diagnosis not present

## 2016-10-05 DIAGNOSIS — E785 Hyperlipidemia, unspecified: Secondary | ICD-10-CM

## 2016-10-05 LAB — COMPLETE METABOLIC PANEL WITH GFR
ALT: 26 U/L (ref 9–46)
AST: 23 U/L (ref 10–35)
Albumin: 4.5 g/dL (ref 3.6–5.1)
Alkaline Phosphatase: 67 U/L (ref 40–115)
BILIRUBIN TOTAL: 0.7 mg/dL (ref 0.2–1.2)
BUN: 12 mg/dL (ref 7–25)
CALCIUM: 9.6 mg/dL (ref 8.6–10.3)
CHLORIDE: 105 mmol/L (ref 98–110)
CO2: 27 mmol/L (ref 20–31)
Creat: 1.08 mg/dL (ref 0.70–1.25)
GFR, EST AFRICAN AMERICAN: 82 mL/min (ref >=60)
GFR, Est Non African American: 71 mL/min (ref >=60)
Glucose, Bld: 82 mg/dL (ref 65–99)
Potassium: 3.9 mmol/L (ref 3.5–5.3)
Sodium: 140 mmol/L (ref 135–146)
Total Protein: 7.3 g/dL (ref 6.1–8.1)

## 2016-10-05 LAB — CBC WITH DIFFERENTIAL/PLATELET
Basophils Absolute: 0 cells/uL (ref 0–200)
Basophils Relative: 0 %
Eosinophils Absolute: 134 cells/uL (ref 15–500)
Eosinophils Relative: 2 %
HEMATOCRIT: 50.7 % — AB (ref 38.5–50.0)
HEMOGLOBIN: 17.2 g/dL — AB (ref 13.2–17.1)
LYMPHS ABS: 1675 {cells}/uL (ref 850–3900)
Lymphocytes Relative: 25 %
MCH: 30.4 pg (ref 27.0–33.0)
MCHC: 33.9 g/dL (ref 32.0–36.0)
MCV: 89.7 fL (ref 80.0–100.0)
MONO ABS: 536 {cells}/uL (ref 200–950)
MPV: 11.7 fL (ref 7.5–12.5)
Monocytes Relative: 8 %
NEUTROS ABS: 4355 {cells}/uL (ref 1500–7800)
Neutrophils Relative %: 65 %
Platelets: 144 10*3/uL (ref 140–400)
RBC: 5.65 MIL/uL (ref 4.20–5.80)
RDW: 13.4 % (ref 11.0–15.0)
WBC: 6.7 10*3/uL (ref 3.8–10.8)

## 2016-10-05 LAB — LIPID PANEL
Cholesterol: 146 mg/dL (ref 125–200)
HDL: 50 mg/dL (ref >=40)
LDL CALC: 76 mg/dL (ref <130)
TRIGLYCERIDES: 98 mg/dL (ref <150)
Total CHOL/HDL Ratio: 2.9 Ratio (ref <=5.0)
VLDL: 20 mg/dL (ref <30)

## 2016-10-05 LAB — POCT GLYCOSYLATED HEMOGLOBIN (HGB A1C): HEMOGLOBIN A1C: 6

## 2016-10-05 MED ORDER — RANITIDINE HCL 150 MG PO TABS: 150.0000 mg | ORAL_TABLET | Freq: Two times a day (BID) | ORAL | 1 refills | Status: DC

## 2016-10-05 NOTE — Patient Instructions (Signed)
Thank you for coming in today, it was so nice to see you! Today we talked about:    Abdominal pain. We have ordered labs and a CT of your abdomen. I have started you on a medication that may help.   Please follow up a couple days after you get the CT of your abdomen. You can schedule this appointment at the front desk before you leave or call the clinic.  Bring in all your medications or supplements to each appointment for review.   If we ordered any tests today, you will be notified via telephone of any abnormalities. If everything is normal you will get a letter in the mail.   If you have any questions or concerns, please do not hesitate to call the office at [phone]. You can also message me directly via MyChart.   Sincerely,  Smitty Cords, MD

## 2016-10-05 NOTE — Progress Notes (Signed)
Subjective:  Chief complaint -- Annual Physical  Other concerns:  Abdominal pain: Patient states that his cardiologist ordered an abdominal ultrasound of his abdomen because he had some long standing abdominal pain for the last couple years on and off. He states he will feel this pain when he goes to lay down in the morning (apparently he doesn't sleep well at night and goes to sleep in the morning". He cannot describe exactly how the pain feels but states it is located in his lower abdomen. After he falls asleep the pain resolves. Denies any nausea, vomiting, diarrhea, constipation, hematochezia, dark stools. Denies any new medications.   General Healthcare: Medication Compliance: yes  Dx Hypertension: No Dx Hyperlipidemia: Yes Diabetes: No Dx Obesity: No Weight Loss: No Physical Activity: Yes, walks around the neighbohood  Urinary Incontinence: None  Social: Driving: Yes, drives himself  Alcohol Use: None Tobacco None  Other Drugs: None  Support and Life at Home: Lives at home with his family (lives with his mother), separated from his wife. Advanced Directives: No   Cancer:  Colorectal: Colonoscopy: Yes, up to date Lung:Tobacco Use: None Skin: Suspicious lesions: No  Other: Osteoporosis: None Zoster Vaccine: has not had yet Pneumonia Vaccine: has not had yet  ROS- see HPI  Past Medical History Patient Active Problem List   Diagnosis Date Noted  . Renal cyst, right 10/09/2016  . Seasonal allergies 03/13/2015  . History of colonic polyps   . Abdominal pain 01/06/2015  . Dizziness 08/28/2013  . Hyperlipidemia 03/19/2013  . Hypertension 01/18/2013  . BPH (benign prostatic hyperplasia) 01/18/2013    Medications- reviewed and updated Current Outpatient Prescriptions  Medication Sig Dispense Refill  . fluticasone (FLONASE) 50 MCG/ACT nasal spray Place 2 sprays into both nostrils daily. 16 g 6  . meclizine (ANTIVERT) 12.5 MG tablet Take 1 tablet (12.5 mg  total) by mouth 3 (three) times daily as needed for dizziness. 30 tablet 0  . ranitidine (ZANTAC) 150 MG tablet Take 1 tablet (150 mg total) by mouth 2 (two) times daily. 60 tablet 1  . rosuvastatin (CRESTOR) 10 MG tablet Take 1 tablet (10 mg total) by mouth daily. 30 tablet 11   No current facility-administered medications for this visit.     Objective: BP (!) 141/71   Pulse 66   Temp 98.3 F (36.8 C) (Oral)   Ht 5\' 7"  (1.702 m)   Wt 173 lb 3.2 oz (78.6 kg)   SpO2 97%   BMI 27.13 kg/m  Gen: NAD, alert, cooperative with exam, pleasant HEENT: indentation on front part of head, EOMI, PERRL, tympanic membranes normal bilaterally, poor dentition CV: RRR, good S1/S2, no murmur Resp: CTABL, no wheezes, non-labored Abd: Soft, Non Tender, Non Distended, BS present, no guarding or organomegaly, no umbilical hernia Ext: No edema, warm Neuro: Alert and oriented, No gross deficits, 2 + patellar reflex bilaterally   Assessment/Plan:  Abdominal pain Unclear etiology. Differentials include GERD (although location of pain does not match up with this) vs constipation vs musculoskeletal pain. Patient had an abdominal ultrasound in May 2017 that he says was ordered by his cardiologist due to his abdominal pain. Abdominal U/S did show a cyst on the right kidney (see A/P). Denies any red flag symptoms and last colonoscopy was in Feb 2016, 3 polyps were removed. At that time is was recommended that patient have a repeat colonoscopy in 5 years.  - Discussed high fiber diet - Begin Ranitidine 150 mg daily BID - If abdominal pain  persists, consider referral to GI  Renal cyst, right Incidental finding seen on abominal U/S in May 2017. MRI or CT abdomen was recommended.  - CT abdomen ordered - Patient will follow up at least 2 days after CT to discuss findings  Hyperlipidemia Controlled. Compliant with Crestor. ASCVD risk calculation 12% according to new lipid panel. Will continue Crestor at current  dose.    Orders Placed This Encounter  Procedures  . CT ABDOMEN PELVIS WO CONTRAST    Abdominal U/S in May 2017 "There is a complicated cyst within the inferior pole of the right kidney containing septation. More definitive assessment of this lesion with renal mass protocol MRI or CT would be advised"    Standing Status:   Future    Standing Expiration Date:   01/05/2018    Order Specific Question:   Reason for Exam (SYMPTOM  OR DIAGNOSIS REQUIRED)    Answer:   abdominal pain    Order Specific Question:   Preferred imaging location?    Answer:   [facility]  . Pneumococcal conjugate vaccine 13-valent IM  . CBC with Differential  . Lipid Panel  . COMPLETE METABOLIC PANEL WITH GFR  . HgB A1c    Meds ordered this encounter  Medications  . ranitidine (ZANTAC) 150 MG tablet    Sig: Take 1 tablet (150 mg total) by mouth 2 (two) times daily.    Dispense:  60 tablet    Refill:  1     Smitty Cords, MD [place], PGY-2

## 2016-10-09 DIAGNOSIS — N281 Cyst of kidney, acquired: Secondary | ICD-10-CM | POA: Insufficient documentation

## 2016-10-09 NOTE — Assessment & Plan Note (Signed)
Controlled. Compliant with Crestor. ASCVD risk calculation 12% according to new lipid panel. Will continue Crestor at current dose.

## 2016-10-09 NOTE — Assessment & Plan Note (Signed)
Incidental finding seen on abominal U/S in May 2017. MRI or CT abdomen was recommended.  - CT abdomen ordered - Patient will follow up at least 2 days after CT to discuss findings

## 2016-10-09 NOTE — Assessment & Plan Note (Signed)
Unclear etiology. Differentials include GERD (although location of pain does not match up with this) vs constipation vs musculoskeletal pain. Patient had an abdominal ultrasound in May 2017 that he says was ordered by his cardiologist due to his abdominal pain. Abdominal U/S did show a cyst on the right kidney (see A/P). Denies any red flag symptoms and last colonoscopy was in Feb 2016, 3 polyps were removed. At that time is was recommended that patient have a repeat colonoscopy in 5 years.  - Discussed high fiber diet - Begin Ranitidine 150 mg daily BID - If abdominal pain persists, consider referral to GI

## 2016-10-10 ENCOUNTER — Encounter: Payer: Self-pay | Admitting: Family Medicine

## 2016-10-11 ENCOUNTER — Ambulatory Visit (HOSPITAL_COMMUNITY): Payer: Medicare Other

## 2016-10-17 ENCOUNTER — Ambulatory Visit (HOSPITAL_COMMUNITY)
Admission: RE | Admit: 2016-10-17 | Discharge: 2016-10-17 | Disposition: A | Discharge status: Home / Self Care | Payer: Medicare Other | Source: Ambulatory Visit | Attending: Family Medicine | Admitting: Family Medicine

## 2016-10-17 ENCOUNTER — Other Ambulatory Visit: Payer: Self-pay | Admitting: Family Medicine

## 2016-10-17 ENCOUNTER — Encounter (HOSPITAL_COMMUNITY): Payer: Self-pay

## 2016-10-17 DIAGNOSIS — R1030 Lower abdominal pain, unspecified: Secondary | ICD-10-CM

## 2016-10-17 MED ORDER — IOPAMIDOL (ISOVUE-300) INJECTION 61%
INTRAVENOUS | Status: AC
  Filled 2016-10-17: qty 100

## 2016-10-24 ENCOUNTER — Telehealth: Payer: Self-pay | Admitting: Family Medicine

## 2016-10-24 ENCOUNTER — Ambulatory Visit (HOSPITAL_COMMUNITY)
Admission: RE | Admit: 2016-10-24 | Discharge: 2016-10-24 | Disposition: A | Discharge status: Home / Self Care | Payer: Medicare Other | Source: Ambulatory Visit | Attending: Family Medicine | Admitting: Family Medicine

## 2016-10-24 ENCOUNTER — Encounter (HOSPITAL_COMMUNITY): Payer: Self-pay | Admitting: Radiology

## 2016-10-24 DIAGNOSIS — N281 Cyst of kidney, acquired: Secondary | ICD-10-CM | POA: Diagnosis not present

## 2016-10-24 DIAGNOSIS — R1030 Lower abdominal pain, unspecified: Secondary | ICD-10-CM | POA: Diagnosis not present

## 2016-10-24 DIAGNOSIS — I700 Atherosclerosis of aorta: Secondary | ICD-10-CM | POA: Insufficient documentation

## 2016-10-24 MED ORDER — IOPAMIDOL (ISOVUE-300) INJECTION 61%
INTRAVENOUS | Status: AC
  Administered 2016-10-24: 75 mL
  Filled 2016-10-24: qty 75

## 2016-10-24 NOTE — Telephone Encounter (Signed)
Discussed CT abdomen results with patient. Discussed small kidney cyst. He was appreciative of the call.

## 2016-10-26 ENCOUNTER — Encounter: Payer: Self-pay | Admitting: Family Medicine

## 2016-10-26 ENCOUNTER — Ambulatory Visit (INDEPENDENT_AMBULATORY_CARE_PROVIDER_SITE_OTHER): Payer: Medicare Other | Admitting: Family Medicine

## 2016-10-26 VITALS — BP 128/72 | HR 67 | Temp 97.7°F | Ht 67.0 in | Wt 176.0 lb

## 2016-10-26 DIAGNOSIS — L814 Other melanin hyperpigmentation: Secondary | ICD-10-CM | POA: Diagnosis present

## 2016-10-26 NOTE — Progress Notes (Signed)
   CC: spots on face  HPI Patient presents for spots over bilateral cheeks on his face, and bilateral dorsal hand surfaces. These have been present for >1 year. No bleeding, itching, or concerning changes in shape or color. Patient was seen in derm clinic in 2015, and given ketoconazole and hydrocortisone for lesion on R mid chest. Patient has been mistakenly applying this to his face as well, without significant changes in lesions. Wanted to know if there were any new creams he could try for these brown spots.   CC, SH/smoking status, and VS noted  Objective: BP 128/72   Pulse 67   Temp 97.7 F (36.5 C) (Oral)   Ht 5\' 7"  (1.702 m)   Wt 176 lb (79.8 kg)   BMI 27.57 kg/m  Gen: NAD, alert, cooperative, and pleasant. Skin: Solar lentigo over bilateral midface, as well as dorsal aspect of both hands. Not raised, non puritic.   Assessment and plan:  Solar lentigo Present over bilateral midface and dorsal hands. Patient was applying ketoconazole and hydrocortisone. Counseled him that these will not change the lesions, and he needs to see dermatology to talk about retinols vs laser therapy. Derm referral placed.   Orders Placed This Encounter  Procedures  . Ambulatory referral to Dermatology    Referral Priority:   Routine    Referral Type:   Consultation    Referral Reason:   Specialty Services Required    Requested Specialty:   Dermatology    Number of Visits Requested:   1    Ralene Ok, MD, PGY1 10/26/2016 4:00 PM

## 2016-10-26 NOTE — Assessment & Plan Note (Signed)
Present over bilateral midface and dorsal hands. Patient was applying ketoconazole and hydrocortisone. Counseled him that these will not change the lesions, and he needs to see dermatology to talk about retinols vs laser therapy. Derm referral placed.

## 2016-10-26 NOTE — Patient Instructions (Signed)
It was nice seeing you today. The lesion on your face and hands are due to sunlight exposure and aging. I will refer you to a dermatologist who will prescribe a stronger medication to treat this.

## 2016-11-22 DIAGNOSIS — C4441 Basal cell carcinoma of skin of scalp and neck: Secondary | ICD-10-CM | POA: Diagnosis not present

## 2016-11-22 DIAGNOSIS — L570 Actinic keratosis: Secondary | ICD-10-CM | POA: Diagnosis not present

## 2016-11-22 DIAGNOSIS — L821 Other seborrheic keratosis: Secondary | ICD-10-CM | POA: Diagnosis not present

## 2016-11-22 DIAGNOSIS — L814 Other melanin hyperpigmentation: Secondary | ICD-10-CM | POA: Diagnosis not present

## 2016-11-29 DIAGNOSIS — C4441 Basal cell carcinoma of skin of scalp and neck: Secondary | ICD-10-CM | POA: Diagnosis not present

## 2016-12-07 ENCOUNTER — Other Ambulatory Visit: Payer: Self-pay | Admitting: Family Medicine

## 2017-02-03 ENCOUNTER — Telehealth: Payer: Self-pay | Admitting: Family Medicine

## 2017-03-14 ENCOUNTER — Telehealth: Payer: Self-pay | Admitting: Family Medicine

## 2017-03-14 NOTE — Telephone Encounter (Signed)
Pt called and needs a refill on his Crestor to be called in. He said that the pharmacy has been faxing [place] for about 1 week. jw

## 2017-03-15 MED ORDER — ROSUVASTATIN CALCIUM 10 MG PO TABS: 10.0000 mg | ORAL_TABLET | Freq: Every day | ORAL | 11 refills | Status: DC

## 2017-03-15 NOTE — Telephone Encounter (Signed)
Refill sent electronically

## 2017-04-08 ENCOUNTER — Other Ambulatory Visit: Payer: Self-pay | Admitting: Family Medicine

## 2017-04-11 ENCOUNTER — Encounter: Payer: Self-pay | Admitting: Family Medicine

## 2017-04-11 ENCOUNTER — Ambulatory Visit (INDEPENDENT_AMBULATORY_CARE_PROVIDER_SITE_OTHER): Payer: Medicare Other | Admitting: Family Medicine

## 2017-04-11 VITALS — BP 130/80 | HR 85 | Temp 97.6°F | Ht 67.0 in | Wt 181.0 lb

## 2017-04-11 DIAGNOSIS — K219 Gastro-esophageal reflux disease without esophagitis: Secondary | ICD-10-CM | POA: Diagnosis not present

## 2017-04-11 DIAGNOSIS — E785 Hyperlipidemia, unspecified: Secondary | ICD-10-CM

## 2017-04-11 DIAGNOSIS — H538 Other visual disturbances: Secondary | ICD-10-CM

## 2017-04-11 MED ORDER — KETOCONAZOLE 2 % EX CREA
1.0000 | TOPICAL_CREAM | Freq: Every day | CUTANEOUS | 0 refills | Status: DC
Start: 2017-04-11 — End: 2017-11-12

## 2017-04-11 MED ORDER — HYDROCORTISONE 2.5 % EX CREA: TOPICAL_CREAM | Freq: Two times a day (BID) | CUTANEOUS | 0 refills | Status: DC

## 2017-04-11 NOTE — Patient Instructions (Signed)
Thank you for coming in today, it was so nice to see you! Today we talked about:    Cholesterol: Your cholesterol looks great based on your last blood work! Continue the crestor as prescribed  The next time we will need to check your blood is October 2018.   Your blood pressure is excellent.   I have placed a referral to an eye doctor for you, someone will call you to schedule this appointment.   Please follow up in October of 2018 or sooner if needed.   If you have any questions or concerns, please do not hesitate to call the office at [phone]. You can also message me directly via MyChart.   Sincerely,  Smitty Cords, MD

## 2017-04-11 NOTE — Progress Notes (Signed)
   Subjective:    Patient ID: Frederick Knight , male   DOB: 09-12-1951 , 66 y.o..   MRN: [id number]  HPI  Frederick Knight is here for  Chief Complaint  Patient presents with  . Follow-up    1. GERD: Symptoms controlled. Takes Ranitidine intermittently. The last time he took is was 2 weeks ago. Does not know any of particular foods that trigger it, it just comes on sometimes. Sometimes heartburn will occur with spicy foods but sometimes it will occur no matter what.   2. HYPERLIPIDEMIA: Symptoms Chest pain on exertion: No Leg claudication: No  Medications (modifying factor): Crestor 10 mg daily  Compliance- Yes   Right upper quadrant pain- No   Muscle aches- No  3. Blurry vision: Patient notes that he has noticed over the last couple years that he cannot see up close very well but he can see far distances better. He notes that this occurs in both eyes. Denies any periods when he cannot see at all. Denies any dizziness or lightheadedness.   Review of Systems: Per HPI. All other systems reviewed and are negative.  Past Medical History: Patient Active Problem List   Diagnosis Date Noted  . GERD (gastroesophageal reflux disease) 04/13/2017  . Blurry vision, bilateral 04/13/2017  . Solar lentigo 10/26/2016  . Renal cyst, right 10/09/2016  . Seasonal allergies 03/13/2015  . History of colonic polyps   . Abdominal pain 01/06/2015  . Dizziness 08/28/2013  . Hyperlipidemia 03/19/2013  . BPH (benign prostatic hyperplasia) 01/18/2013    Medications: reviewed and updated  Social Hx:  reports that he has never smoked. He has never used smokeless tobacco.   Objective:   BP 130/80   Pulse 85   Temp 97.6 F (36.4 C) (Oral)   Ht 5\' 7"  (1.702 m)   Wt 181 lb (82.1 kg)   SpO2 95%   BMI 28.35 kg/m  Physical Exam  Gen: NAD, alert, cooperative with exam, well-appearing HEENT: NCAT, PERRL, clear conjunctiva, oropharynx clear, supple neck Cardiac: Regular rate and rhythm, normal  S1/S2, no murmur, no edema, capillary refill brisk  Respiratory: Clear to auscultation bilaterally, no wheezes, non-labored breathing Gastrointestinal: soft, non tender, non distended, bowel sounds present Skin: solar lentigo over cheeks and forehead as well as dorsal hands Neurological: no gross deficits.  Psych: good insight, normal mood and affect  Assessment & Plan:  Hyperlipidemia Controlled per last lipid panel.  - Continue Crestor 10 mg daily  GERD (gastroesophageal reflux disease) Controlled with intermittent use of Ranitidine - continue ranitidine PRN  Blurry vision, bilateral Suspect age related changes. Normal neurological exam - Referral to opthamology   Smitty Cords, MD [place], PGY-2

## 2017-04-13 DIAGNOSIS — H538 Other visual disturbances: Secondary | ICD-10-CM | POA: Insufficient documentation

## 2017-04-13 DIAGNOSIS — K219 Gastro-esophageal reflux disease without esophagitis: Secondary | ICD-10-CM | POA: Insufficient documentation

## 2017-04-13 NOTE — Assessment & Plan Note (Signed)
Suspect age related changes. Normal neurological exam - Referral to opthamology

## 2017-04-13 NOTE — Assessment & Plan Note (Signed)
Controlled per last lipid panel.  - Continue Crestor 10 mg daily

## 2017-04-13 NOTE — Assessment & Plan Note (Signed)
Controlled with intermittent use of Ranitidine - continue ranitidine PRN

## 2017-07-08 ENCOUNTER — Other Ambulatory Visit: Payer: Self-pay | Admitting: Family Medicine

## 2017-08-22 ENCOUNTER — Other Ambulatory Visit: Payer: Self-pay | Admitting: Internal Medicine

## 2017-10-30 ENCOUNTER — Other Ambulatory Visit: Payer: Self-pay

## 2017-10-30 ENCOUNTER — Ambulatory Visit (INDEPENDENT_AMBULATORY_CARE_PROVIDER_SITE_OTHER): Payer: Medicare Other | Admitting: Family Medicine

## 2017-10-30 ENCOUNTER — Encounter: Payer: Self-pay | Admitting: Family Medicine

## 2017-10-30 VITALS — BP 118/72 | HR 93 | Temp 97.9°F | Ht 67.0 in | Wt 181.6 lb

## 2017-10-30 DIAGNOSIS — E785 Hyperlipidemia, unspecified: Secondary | ICD-10-CM | POA: Diagnosis not present

## 2017-10-30 DIAGNOSIS — Z0000 Encounter for general adult medical examination without abnormal findings: Secondary | ICD-10-CM | POA: Diagnosis not present

## 2017-10-30 DIAGNOSIS — Z23 Encounter for immunization: Secondary | ICD-10-CM

## 2017-10-30 MED ORDER — ROSUVASTATIN CALCIUM 10 MG PO TABS: 10.0000 mg | ORAL_TABLET | Freq: Every day | ORAL | 2 refills | Status: DC

## 2017-10-30 MED ORDER — RANITIDINE HCL 150 MG PO TABS: 150.0000 mg | ORAL_TABLET | Freq: Two times a day (BID) | ORAL | 2 refills | Status: DC

## 2017-10-30 NOTE — Progress Notes (Signed)
Subjective:  Frederick Knight is a 66 y.o. year old male with PMH of GERD, BPH, HLD, seasonal allergies who presents to office today for an annual physical examination.  No concerns today.   Review of Systems  Constitutional: Negative for fever and weight loss.  HENT: Negative for ear pain, hearing loss and sinus pain.   Eyes: Negative for blurred vision.  Respiratory: Negative for cough, shortness of breath and wheezing.   Cardiovascular: Negative for chest pain and leg swelling.  Gastrointestinal: Negative for abdominal pain, blood in stool, constipation, diarrhea, heartburn, melena, nausea and vomiting.  Genitourinary: Negative for dysuria and frequency.  Musculoskeletal: Negative for back pain. Positive for  joint pain.  Skin: Negative for rash.  Neurological: Negative for dizziness, tingling, focal weakness and headaches.  Psychiatric/Behavioral: Negative for depression and suicidal ideas.    General Healthcare: Medication Compliance: Yes Dx Hypertension: no Dx Hyperlipidemia: yes Diabetes: no Dx Obesity: no Weight Loss: no Physical Activity: walks a lot  Urinary Incontinence: no  Menstrual hx: n/a Last dental exam: years ago   Social:  reports that  has never smoked. he has never used smokeless tobacco. Driving: drives himself, wears seatbelt  Alcohol Use: None Tobacco None Other Drugs: None Support and Life at Home: Lives with his mother 74 yo Advanced Directives: None Work: Still working as a professor- Nurse, mental health at [employer]:  Colorectal >> Colonoscopy: yes Lung >> Tobacco Use: No Breast >> Mammogram: n/a Skin >> Suspicious lesions: No  Other: Osteoporosis: No TDAP: up to date  Health Maintenance Due  Topic Date Due  . Hepatitis C Screening  1951-06-05    Past Medical History Past Medical History:  Diagnosis Date  . Hyperlipemia    Controlled well with medications   Patient Active Problem List   Diagnosis Date Noted  . GERD  (gastroesophageal reflux disease) 04/13/2017  . Blurry vision, bilateral 04/13/2017  . Solar lentigo 10/26/2016  . Renal cyst, right 10/09/2016  . Seasonal allergies 03/13/2015  . History of colonic polyps   . Abdominal pain 01/06/2015  . Encounter for physical examination 10/15/2014  . Dizziness 08/28/2013  . Hyperlipidemia 03/19/2013  . BPH (benign prostatic hyperplasia) 01/18/2013    Medications- reviewed and updated Current Outpatient Medications  Medication Sig Dispense Refill  . hydrocortisone 2.5 % cream Apply topically 2 (two) times daily. 30 g 0  . ketoconazole (NIZORAL) 2 % cream Apply 1 application topically daily. 60 g 0  . ranitidine (ZANTAC) 150 MG tablet Take 1 tablet (150 mg total) 2 (two) times daily by mouth. 90 tablet 2  . rosuvastatin (CRESTOR) 10 MG tablet Take 1 tablet (10 mg total) daily by mouth. 90 tablet 2  . fluticasone (FLONASE) 50 MCG/ACT nasal spray Place 2 sprays into both nostrils daily. (Patient not taking: Reported on 04/11/2017) 16 g 6   No current facility-administered medications for this visit.     Objective: BP 118/72   Pulse 93   Temp 97.9 F (36.6 C) (Oral)   Ht 5\' 7"  (1.702 m)   Wt 181 lb 9.6 oz (82.4 kg)   SpO2 98%   BMI 28.44 kg/m  Gen: In no acute distress, alert, cooperative with exam, well groomed HEENT: NCAT, EOMI, PERRL, poor dentition CV: Regular rate and rhythm, normal S1/S2, no murmur Resp: Clear to auscultation bilaterally, no wheezes, non-labored Abd: Soft, Non Tender, Non Distended, bowel sounds present, no guarding or organomegaly Ext: No edema, warm and well perfused Neuro: Alert and  oriented, No gross deficits, normal gait Psych: Normal mood and affect   Assessment/Plan:  Encounter for physical examination Patient doing well overall. No concerns.  - PCV 23 and flu shot given today - Labs ordered (see below) - up to date on colonoscopy - refills provided for medications - follow up in 1 year for next  physical   Orders Placed This Encounter  Procedures  . Flu Vaccine QUAD 36+ mos IM  . Pneumococcal polysaccharide vaccine 23-valent greater than or equal to 2yo subcutaneous/IM  . Lipid panel    Standing Status:   Future    Number of Occurrences:   1    Standing Expiration Date:   10/30/2018    Order Specific Question:   Has the patient fasted?    Answer:   No  . Comprehensive metabolic panel    Standing Status:   Future    Number of Occurrences:   1    Standing Expiration Date:   10/30/2018    Order Specific Question:   Has the patient fasted?    Answer:   No  . CBC with Differential    Standing Status:   Future    Number of Occurrences:   1    Standing Expiration Date:   10/30/2018    Meds ordered this encounter  Medications  . ranitidine (ZANTAC) 150 MG tablet    Sig: Take 1 tablet (150 mg total) 2 (two) times daily by mouth.    Dispense:  90 tablet    Refill:  2  . rosuvastatin (CRESTOR) 10 MG tablet    Sig: Take 1 tablet (10 mg total) daily by mouth.    Dispense:  90 tablet    Refill:  2     Smitty Cords, MD [place], PGY-3

## 2017-10-30 NOTE — Patient Instructions (Addendum)
Thank you for coming in today, it was so nice to see you! Today we talked about:    Annual physical. You are doing great! Continue keeping active and walking.   I have refilled your medications for you  Please come back in the morning after 8:30AM sometime this week to get your blood checked.   Please follow up in 1 year for your next annual exam.   If we ordered any tests today, you will be notified via telephone of any abnormalities. If everything is normal you will get a letter in the mail.   If you have any questions or concerns, please do not hesitate to call the office at [phone]. You can also message me directly via MyChart.   Sincerely,  Smitty Cords, MD

## 2017-10-31 ENCOUNTER — Other Ambulatory Visit: Payer: Medicare Other

## 2017-10-31 DIAGNOSIS — E785 Hyperlipidemia, unspecified: Secondary | ICD-10-CM | POA: Diagnosis not present

## 2017-11-01 ENCOUNTER — Encounter: Payer: Self-pay | Admitting: Family Medicine

## 2017-11-01 LAB — CBC WITH DIFFERENTIAL/PLATELET
BASOS: 1 %
Basophils Absolute: 0 10*3/uL (ref 0.0–0.2)
EOS (ABSOLUTE): 0.1 10*3/uL (ref 0.0–0.4)
Eos: 2 %
Hematocrit: 49.4 % (ref 37.5–51.0)
Hemoglobin: 17.4 g/dL (ref 13.0–17.7)
Immature Grans (Abs): 0 10*3/uL (ref 0.0–0.1)
Immature Granulocytes: 0 %
Lymphocytes Absolute: 2.1 10*3/uL (ref 0.7–3.1)
Lymphs: 27 %
MCH: 31.5 pg (ref 26.6–33.0)
MCHC: 35.2 g/dL (ref 31.5–35.7)
MCV: 90 fL (ref 79–97)
MONOS ABS: 0.7 10*3/uL (ref 0.1–0.9)
Monocytes: 9 %
NEUTROS ABS: 4.7 10*3/uL (ref 1.4–7.0)
Neutrophils: 61 %
PLATELETS: 143 10*3/uL — AB (ref 150–379)
RBC: 5.52 x10E6/uL (ref 4.14–5.80)
RDW: 13.5 % (ref 12.3–15.4)
WBC: 7.7 10*3/uL (ref 3.4–10.8)

## 2017-11-01 LAB — COMPREHENSIVE METABOLIC PANEL
A/G RATIO: 1.7 (ref 1.2–2.2)
ALBUMIN: 4.8 g/dL (ref 3.6–4.8)
ALT: 30 IU/L (ref 0–44)
AST: 22 IU/L (ref 0–40)
Alkaline Phosphatase: 83 IU/L (ref 39–117)
BILIRUBIN TOTAL: 0.6 mg/dL (ref 0.0–1.2)
BUN / CREAT RATIO: 12 (ref 10–24)
BUN: 13 mg/dL (ref 8–27)
CALCIUM: 10 mg/dL (ref 8.6–10.2)
CHLORIDE: 102 mmol/L (ref 96–106)
CO2: 22 mmol/L (ref 20–29)
Creatinine, Ser: 1.13 mg/dL (ref 0.76–1.27)
GFR calc Af Amer: 77 mL/min/{1.73_m2} (ref >59)
GFR, EST NON AFRICAN AMERICAN: 67 mL/min/{1.73_m2} (ref >59)
GLOBULIN, TOTAL: 2.8 g/dL (ref 1.5–4.5)
Glucose: 95 mg/dL (ref 65–99)
POTASSIUM: 4.3 mmol/L (ref 3.5–5.2)
SODIUM: 141 mmol/L (ref 134–144)
TOTAL PROTEIN: 7.6 g/dL (ref 6.0–8.5)

## 2017-11-01 LAB — LIPID PANEL
CHOL/HDL RATIO: 3.2 ratio (ref 0.0–5.0)
Cholesterol, Total: 158 mg/dL (ref 100–199)
HDL: 49 mg/dL (ref >39)
LDL Calculated: 84 mg/dL (ref 0–99)
Triglycerides: 125 mg/dL (ref 0–149)
VLDL Cholesterol Cal: 25 mg/dL (ref 5–40)

## 2017-11-03 NOTE — Assessment & Plan Note (Signed)
Patient doing well overall. No concerns.  - PCV 23 and flu shot given today - Labs ordered (see below) - up to date on colonoscopy - refills provided for medications - follow up in 1 year for next physical

## 2017-11-12 ENCOUNTER — Other Ambulatory Visit: Payer: Self-pay | Admitting: Family Medicine

## 2017-12-14 ENCOUNTER — Other Ambulatory Visit: Payer: Self-pay | Admitting: Family Medicine

## 2018-05-09 ENCOUNTER — Other Ambulatory Visit: Payer: Self-pay

## 2018-05-09 ENCOUNTER — Ambulatory Visit (INDEPENDENT_AMBULATORY_CARE_PROVIDER_SITE_OTHER): Payer: Medicare Other | Admitting: Internal Medicine

## 2018-05-09 VITALS — BP 112/84 | HR 77 | Temp 98.3°F | Ht 67.0 in | Wt 170.8 lb

## 2018-05-09 DIAGNOSIS — J0110 Acute frontal sinusitis, unspecified: Secondary | ICD-10-CM | POA: Diagnosis not present

## 2018-05-09 DIAGNOSIS — J302 Other seasonal allergic rhinitis: Secondary | ICD-10-CM

## 2018-05-09 MED ORDER — FLUTICASONE PROPIONATE 50 MCG/ACT NA SUSP: 2.0000 | Freq: Every day | NASAL | 0 refills | Status: DC

## 2018-05-09 MED ORDER — GUAIFENESIN-DM 100-10 MG/5ML PO SYRP: 5.0000 mL | ORAL_SOLUTION | ORAL | 0 refills | Status: DC | PRN

## 2018-05-09 NOTE — Progress Notes (Signed)
   [facility] Kerrin Mo, MD Phone: [phone]  Reason For Visit: SDA for Flu like Symptoms   # URI  Has been sick for 3 days.  Notes that he developed nasal congestion, sore throat, headache for the past 2 to 3 days.  He indicates that he has had sinus infections previously and this feels similar to that.  He indicates feeling very stuffy in his head.  States yesterday he did have some joint pains associated with his cold.  He denies any fevers.  He does indicate he has had a cough with sputum production.  Symptoms Fever: None  Headache or face pain: yes  Sneezing: yes  Scratchy throat: yes  Allergies: None  Muscle aches: yes  Severe fatigue: None  Stiff neck: None  Shortness of breath: None    ROS see HPI Smoking Status note   Objective: BP 112/84 (BP Location: Left Arm, Patient Position: Sitting, Cuff Size: Normal)   Pulse 77   Temp 98.3 F (36.8 C) (Oral)   Ht 5\' 7"  (1.702 m)   Wt 170 lb 12.8 oz (77.5 kg)   SpO2 97%   BMI 26.75 kg/m  Gen: NAD, alert, cooperative with exam HEENT: Normal    Neck: No masses palpated. spotty lymphadenopathy     Nose: nasal turbinates congested    Throat: Erythematous throat Cardio: regular rate and rhythm, S1S2 heard, no murmurs appreciated Pulm: clear to auscultation bilaterally, no wheezes, rhonchi or rales Skin: dry, intact, no rashes or lesions   Assessment/Plan: See problem based a/p  Acute frontal sinusitis -Follow-up if no improvement by Monday - will consider antibiotics at that point  - fluticasone (FLONASE) 50 MCG/ACT nasal spray; Place 2 sprays into both nostrils daily.  Dispense: 16 g; Refill: 0 - guaiFENesin-dextromethorphan (ROBITUSSIN DM) 100-10 MG/5ML syrup; Take 5 mLs by mouth every 4 (four) hours as needed for cough.  Dispense: 118 mL; Refill: 0

## 2018-05-09 NOTE — Patient Instructions (Signed)
This is likely a viral infection.  In the meantime you can use the  the Robitussin for decongestion as well as the Flonase to help relieve the symptoms. If you do not improve in 3 to 4 days please return for follow-up.

## 2018-05-09 NOTE — Assessment & Plan Note (Addendum)
-  Follow-up if no improvement by Monday - will consider antibiotics at that point  - fluticasone (FLONASE) 50 MCG/ACT nasal spray; Place 2 sprays into both nostrils daily.  Dispense: 16 g; Refill: 0 - guaiFENesin-dextromethorphan (ROBITUSSIN DM) 100-10 MG/5ML syrup; Take 5 mLs by mouth every 4 (four) hours as needed for cough.  Dispense: 118 mL; Refill: 0

## 2018-05-13 NOTE — Progress Notes (Signed)
   [facility] Phone: [phone]   Date of Visit: 05/14/2018   HPI:  Sinusitis:  - patient was seen on 5/29 and diagnosed with viral sinusitis. Was given FLonase and cough medication. His cough has improved. The frontal pressure improved slightly on Friday, but returned Saturday/Sunday. His symptoms initially began 3 days prior to his initial doctor's visit.   - no fevers or chills  - no shortness of breath or wheezing   ROS: See HPI.  [place]:  PMH: GERD BPH HLD   PHYSICAL EXAM: BP 118/66   Pulse 74   Temp 97.9 F (36.6 C) (Oral)   Wt 167 lb (75.8 kg)   SpO2 96%   BMI 26.16 kg/m  GEN: NAD HEENT: neck is supple without adenopathy. Pharynx is mildly erythematous without exudates. Nasal turbinates mildly erythematous. Tenderness to percussion of the frontal sinuses. No tenderness of the maxillary sinuses.  CV: RRR, no murmurs, rubs, or gallops PULM: CTAB, normal effort SKIN: No rash or cyanosis; warm and well-perfused PSYCH: Mood and affect euthymic, normal rate and volume of speech NEURO: Awake, alert, no focal deficits grossly, normal speech;  ASSESSMENT/PLAN:  1. Acute bacterial sinusitis Due to continued symptoms, will treat with antibiotic for bacterial sinusitis. No concern for PNA. Amoxicillin 875mg  BID x 5 days. Return precautions discussed.   Smiley Houseman, MD PGY [place]

## 2018-05-14 ENCOUNTER — Encounter: Payer: Self-pay | Admitting: Internal Medicine

## 2018-05-14 ENCOUNTER — Ambulatory Visit (INDEPENDENT_AMBULATORY_CARE_PROVIDER_SITE_OTHER): Payer: Medicare Other | Admitting: Internal Medicine

## 2018-05-14 ENCOUNTER — Other Ambulatory Visit: Payer: Self-pay

## 2018-05-14 VITALS — BP 118/66 | HR 74 | Temp 97.9°F | Wt 167.0 lb

## 2018-05-14 DIAGNOSIS — B9689 Other specified bacterial agents as the cause of diseases classified elsewhere: Secondary | ICD-10-CM | POA: Diagnosis not present

## 2018-05-14 DIAGNOSIS — J019 Acute sinusitis, unspecified: Secondary | ICD-10-CM

## 2018-05-14 MED ORDER — AMOXICILLIN 875 MG PO TABS: 875.0000 mg | ORAL_TABLET | Freq: Two times a day (BID) | ORAL | 0 refills | Status: AC

## 2018-05-14 NOTE — Patient Instructions (Signed)
We will treat you for bacterial sinusitis. Please take Amoxicillin 1 tablet twice a day for 5 days.

## 2018-05-25 ENCOUNTER — Encounter (HOSPITAL_COMMUNITY): Payer: Self-pay

## 2018-05-25 ENCOUNTER — Ambulatory Visit: Payer: Medicare Other | Admitting: Family Medicine

## 2018-05-25 ENCOUNTER — Emergency Department (HOSPITAL_COMMUNITY): Payer: Medicare Other

## 2018-05-25 ENCOUNTER — Emergency Department (HOSPITAL_COMMUNITY)
Admission: EM | Admit: 2018-05-25 | Discharge: 2018-05-25 | Disposition: A | Discharge status: Home / Self Care | Payer: Medicare Other | Attending: Emergency Medicine | Admitting: Emergency Medicine

## 2018-05-25 ENCOUNTER — Other Ambulatory Visit: Payer: Self-pay

## 2018-05-25 DIAGNOSIS — Z79899 Other long term (current) drug therapy: Secondary | ICD-10-CM | POA: Insufficient documentation

## 2018-05-25 DIAGNOSIS — R509 Fever, unspecified: Secondary | ICD-10-CM | POA: Insufficient documentation

## 2018-05-25 DIAGNOSIS — J0110 Acute frontal sinusitis, unspecified: Secondary | ICD-10-CM | POA: Diagnosis not present

## 2018-05-25 DIAGNOSIS — R55 Syncope and collapse: Secondary | ICD-10-CM | POA: Insufficient documentation

## 2018-05-25 LAB — URINALYSIS, ROUTINE W REFLEX MICROSCOPIC
BACTERIA UA: NONE SEEN
BILIRUBIN URINE: NEGATIVE
Glucose, UA: NEGATIVE mg/dL
KETONES UR: 5 mg/dL — AB
LEUKOCYTES UA: NEGATIVE
Nitrite: NEGATIVE
PROTEIN: 30 mg/dL — AB
SPECIFIC GRAVITY, URINE: 1.025 (ref 1.005–1.030)
pH: 6 (ref 5.0–8.0)

## 2018-05-25 LAB — CBC
HEMATOCRIT: 45.7 % (ref 39.0–52.0)
Hemoglobin: 15.8 g/dL (ref 13.0–17.0)
MCH: 31.2 pg (ref 26.0–34.0)
MCHC: 34.6 g/dL (ref 30.0–36.0)
MCV: 90.1 fL (ref 78.0–100.0)
PLATELETS: UNDETERMINED 10*3/uL (ref 150–400)
RBC: 5.07 MIL/uL (ref 4.22–5.81)
RDW: 13.3 % (ref 11.5–15.5)
WBC: 4.4 10*3/uL (ref 4.0–10.5)

## 2018-05-25 LAB — BASIC METABOLIC PANEL
Anion gap: 8 (ref 5–15)
BUN: 21 mg/dL — AB (ref 6–20)
CHLORIDE: 106 mmol/L (ref 101–111)
CO2: 22 mmol/L (ref 22–32)
CREATININE: 1.12 mg/dL (ref 0.61–1.24)
Calcium: 8.7 mg/dL — ABNORMAL LOW (ref 8.9–10.3)
GFR calc non Af Amer: >60 mL/min (ref >60)
Glucose, Bld: 134 mg/dL — ABNORMAL HIGH (ref 65–99)
POTASSIUM: 3.7 mmol/L (ref 3.5–5.1)
Sodium: 136 mmol/L (ref 135–145)

## 2018-05-25 LAB — HEPATIC FUNCTION PANEL
ALT: 46 U/L (ref 17–63)
AST: 51 U/L — ABNORMAL HIGH (ref 15–41)
Albumin: 3.7 g/dL (ref 3.5–5.0)
Alkaline Phosphatase: 79 U/L (ref 38–126)
BILIRUBIN DIRECT: 0.2 mg/dL (ref 0.1–0.5)
BILIRUBIN INDIRECT: 0.3 mg/dL (ref 0.3–0.9)
TOTAL PROTEIN: 6.9 g/dL (ref 6.5–8.1)
Total Bilirubin: 0.5 mg/dL (ref 0.3–1.2)

## 2018-05-25 LAB — I-STAT CG4 LACTIC ACID, ED: LACTIC ACID, VENOUS: 1.02 mmol/L (ref 0.5–1.9)

## 2018-05-25 LAB — CBG MONITORING, ED: GLUCOSE-CAPILLARY: 137 mg/dL — AB (ref 65–99)

## 2018-05-25 MED ORDER — AMOXICILLIN-POT CLAVULANATE 875-125 MG PO TABS: 1.0000 | ORAL_TABLET | Freq: Two times a day (BID) | ORAL | 0 refills | Status: DC

## 2018-05-25 MED ORDER — AMOXICILLIN-POT CLAVULANATE 875-125 MG PO TABS
1.0000 | ORAL_TABLET | Freq: Once | ORAL | Status: AC
  Administered 2018-05-25: 1 via ORAL
  Filled 2018-05-25: qty 1

## 2018-05-25 MED ORDER — ACETAMINOPHEN 325 MG PO TABS
650.0000 mg | ORAL_TABLET | Freq: Once | ORAL | Status: AC
  Administered 2018-05-25: 650 mg via ORAL
  Filled 2018-05-25: qty 2

## 2018-05-25 MED ORDER — TRAMADOL HCL 50 MG PO TABS
50.0000 mg | ORAL_TABLET | Freq: Once | ORAL | Status: AC
  Administered 2018-05-25: 50 mg via ORAL
  Filled 2018-05-25: qty 1
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
## 2018-05-25 NOTE — ED Notes (Signed)
Patient ambulated well to the bathroom.

## 2018-05-25 NOTE — ED Notes (Signed)
Patient aware we need a urine sample. Urinal at bedside. °

## 2018-05-25 NOTE — ED Notes (Addendum)
Patients family friend [phone]. Frederick Knight

## 2018-05-25 NOTE — Discharge Instructions (Addendum)
It was our pleasure to provide your ER care today - we hope that you feel better.  Take antibiotic as prescribed.  Rest. Drink plenty of fluids.  Take acetaminophen as need for pain/fever. You may take zyrtec or claritin as need if sinus congestion.  Follow up with primary care doctor this Monday or Tuesday for recheck if symptoms fail to improve/resolve.  Return to ER if worse, new symptoms, weak/fainting, trouble breathing, other concern.

## 2018-05-25 NOTE — ED Provider Notes (Signed)
[facility] DEPT Provider Note   CSN: [id number] Arrival date & time: 05/25/18  1055     History   Chief Complaint Chief Complaint  Patient presents with  . Loss of Consciousness    HPI Frederick Knight is a 67 y.o. male.  Patient with episode near syncope at home today. Sates in past 1-2 weeks had uri symptoms, w productive cough, congestion. Was started on abx and completed course, but notes symptoms persists. Symptoms gradual onset, moderate, persistent. Has had poor po intake, poor appetite in past 1-2 weeks. Had eaten/drank very little today. Had stood up at felt faint/lightheaded. Family member assisted to ground. Denies chest pain or sob. No palpitations. Denies abd pain. No vomiting or diarrhea. No dysuria or gu c/o. Fever 101. No skin changes/rash/lesions. Gradual onset intermittent dull frontal and frontal sinus area pain/headaches. +nasal congestion.  No severe, acute or abrupt pain. No neck pain or stiffness.   The history is provided by the patient and a relative.  Loss of Consciousness   Associated symptoms include congestion, fever and headaches. Pertinent negatives include abdominal pain, back pain, chest pain, confusion, palpitations and vomiting.    Past Medical History:  Diagnosis Date  . Hyperlipemia    Controlled well with medications    Patient Active Problem List   Diagnosis Date Noted  . GERD (gastroesophageal reflux disease) 04/13/2017  . Blurry vision, bilateral 04/13/2017  . Solar lentigo 10/26/2016  . Renal cyst, right 10/09/2016  . Seasonal allergies 03/13/2015  . History of colonic polyps   . Abdominal pain 01/06/2015  . Encounter for physical examination 10/15/2014  . Acute frontal sinusitis 09/26/2013  . Dizziness 08/28/2013  . Hyperlipidemia 03/19/2013  . BPH (benign prostatic hyperplasia) 01/18/2013    Past Surgical History:  Procedure Laterality Date  . COLONOSCOPY N/A 01/15/2015   RMR:  Multiple polyps  removed as described above.  Marland Kitchen CRANIOTOMY  05/16/2012   Procedure: CRANIOTOMY HEMATOMA EVACUATION SUBDURAL;  Surgeon: Elaina Hoops, MD;  Location: [place] NEURO ORS;  Service: Neurosurgery;  Laterality: N/A;  Right burr holes for evacuation of subdural hematoma  . CRANIOTOMY  05/18/2012   Procedure: CRANIOTOMY HEMATOMA EVACUATION SUBDURAL;  Surgeon: Elaina Hoops, MD;  Location: [place] NEURO ORS;  Service: Neurosurgery;  Laterality: Right;        Home Medications    Prior to Admission medications   Medication Sig Start Date End Date Taking? Authorizing Provider  fluticasone (FLONASE) 50 MCG/ACT nasal spray Place 2 sprays into both nostrils daily. 05/09/18  Yes Mikell, Jeani Sow, MD  guaiFENesin-dextromethorphan (ROBITUSSIN DM) 100-10 MG/5ML syrup Take 5 mLs by mouth every 4 (four) hours as needed for cough. 05/09/18  Yes Mikell, Jeani Sow, MD  hydrocortisone 2.5 % cream APPLY TO AFFECTED AREA TWICE A DAY Patient taking differently: APPLY TO AFFECTED AREA TWICE A DAY prn for face spots 11/13/17  Yes Gambino, Arlie Solomons, MD  ketoconazole (NIZORAL) 2 % cream APPLY 1 APPLICATION TOPICALLY DAILY. Patient taking differently: Apply 1 application topically daily as needed for irritation.  11/13/17  Yes Carlyle Dolly, MD  ranitidine (ZANTAC) 150 MG tablet TAKE 1 TABLET BY MOUTH TWICE A DAY Patient taking differently: TAKE 1 TABLET BY MOUTH TWICE A DAY prn for stomach acid 12/15/17  Yes Carlyle Dolly, MD  rosuvastatin (CRESTOR) 10 MG tablet Take 1 tablet (10 mg total) daily by mouth. 10/30/17  Yes Gambino, Arlie Solomons, MD    Family History Family History  Problem Relation  Age of Onset  . Stroke Mother   . Colon cancer Neg Hx     Social History Social History   Tobacco Use  . Smoking status: Never Smoker  . Smokeless tobacco: Never Used  Substance Use Topics  . Alcohol use: No    Alcohol/week: 0.0 oz    Comment: Used to drink socially; Does not drink anymore.  . Drug use: No      Allergies   Patient has no known allergies.   Review of Systems Review of Systems  Constitutional: Positive for fever.  HENT: Positive for congestion and sinus pain. Negative for sore throat.   Eyes: Negative for visual disturbance.  Respiratory: Positive for cough. Negative for shortness of breath.   Cardiovascular: Positive for syncope. Negative for chest pain, palpitations and leg swelling.  Gastrointestinal: Negative for abdominal pain, blood in stool, diarrhea and vomiting.  Genitourinary: Negative for dysuria and flank pain.  Musculoskeletal: Negative for back pain and neck pain.  Skin: Negative for rash.  Neurological: Positive for headaches. Negative for speech difficulty.  Hematological: Does not bruise/bleed easily.  Psychiatric/Behavioral: Negative for confusion.     Physical Exam Updated Vital Signs BP 118/73   Pulse 90   Temp (!) 101.1 F (38.4 C) (Rectal)   Resp (!) 24   Ht 1.702 m (5\' 7" )   Wt 75.8 kg (167 lb)   SpO2 95%   BMI 26.16 kg/m   Physical Exam  Constitutional: He appears well-developed and well-nourished. No distress.  HENT:  Head: Atraumatic.  Mouth/Throat: Oropharynx is clear and moist.  Frontal sinus tenderness.   Eyes: Pupils are equal, round, and reactive to light. Conjunctivae and EOM are normal.  Neck: Normal range of motion. Neck supple. No tracheal deviation present. No thyromegaly present.  No bruits. No stiffness or rigidity. No meningeal signs.  Cardiovascular: Normal rate, regular rhythm, normal heart sounds and intact distal pulses. Exam reveals no gallop and no friction rub.  No murmur heard. Pulmonary/Chest: Effort normal and breath sounds normal. No accessory muscle usage. No respiratory distress. He exhibits no tenderness.  Abdominal: Soft. Bowel sounds are normal. He exhibits no distension. There is no tenderness.  No pulsatile mass.   Genitourinary:  Genitourinary Comments: No cva tenderness  Musculoskeletal: He  exhibits no edema.  Neurological: He is alert.  Speech clear/fluent. Motor fxn grossly intact bil, good strength. Steady gait.   Skin: Skin is warm and dry. He is not diaphoretic.  Psychiatric: He has a normal mood and affect.  Nursing note and vitals reviewed.    ED Treatments / Results  Labs (all labs ordered are listed, but only abnormal results are displayed) Results for orders placed or performed during the hospital encounter of 44/31/54  Basic metabolic panel  Result Value Ref Range   Sodium 136 135 - 145 mmol/L   Potassium 3.7 3.5 - 5.1 mmol/L   Chloride 106 101 - 111 mmol/L   CO2 22 22 - 32 mmol/L   Glucose, Bld 134 (H) 65 - 99 mg/dL   BUN 21 (H) 6 - 20 mg/dL   Creatinine, Ser 1.12 0.61 - 1.24 mg/dL   Calcium 8.7 (L) 8.9 - 10.3 mg/dL   GFR calc non Af Amer >60 >60 mL/min   GFR calc Af Amer >60 >60 mL/min   Anion gap 8 5 - 15  CBC  Result Value Ref Range   WBC 4.4 4.0 - 10.5 K/uL   RBC 5.07 4.22 - 5.81 MIL/uL   Hemoglobin  15.8 13.0 - 17.0 g/dL   HCT 45.7 39.0 - 52.0 %   MCV 90.1 78.0 - 100.0 fL   MCH 31.2 26.0 - 34.0 pg   MCHC 34.6 30.0 - 36.0 g/dL   RDW 13.3 11.5 - 15.5 %   Platelets PLATELET CLUMPS NOTED ON SMEAR, UNABLE TO ESTIMATE 150 - 400 K/uL  Urinalysis, Routine w reflex microscopic  Result Value Ref Range   Color, Urine AMBER (A) YELLOW   APPearance CLEAR CLEAR   Specific Gravity, Urine 1.025 1.005 - 1.030   pH 6.0 5.0 - 8.0   Glucose, UA NEGATIVE NEGATIVE mg/dL   Hgb urine dipstick SMALL (A) NEGATIVE   Bilirubin Urine NEGATIVE NEGATIVE   Ketones, ur 5 (A) NEGATIVE mg/dL   Protein, ur 30 (A) NEGATIVE mg/dL   Nitrite NEGATIVE NEGATIVE   Leukocytes, UA NEGATIVE NEGATIVE   RBC / HPF 11-20 0 - 5 RBC/hpf   WBC, UA 0-5 0 - 5 WBC/hpf   Bacteria, UA NONE SEEN NONE SEEN   Squamous Epithelial / LPF 0-5 0 - 5   Mucus PRESENT    Hyaline Casts, UA PRESENT   Hepatic function panel  Result Value Ref Range   Total Protein 6.9 6.5 - 8.1 g/dL   Albumin 3.7 3.5  - 5.0 g/dL   AST 51 (H) 15 - 41 U/L   ALT 46 17 - 63 U/L   Alkaline Phosphatase 79 38 - 126 U/L   Total Bilirubin 0.5 0.3 - 1.2 mg/dL   Bilirubin, Direct 0.2 0.1 - 0.5 mg/dL   Indirect Bilirubin 0.3 0.3 - 0.9 mg/dL  CBG monitoring, ED  Result Value Ref Range   Glucose-Capillary 137 (H) 65 - 99 mg/dL  I-Stat CG4 Lactic Acid, ED  (not at  [facility])  Result Value Ref Range   Lactic Acid, Venous 1.02 0.5 - 1.9 mmol/L   Dg Chest 2 View  Result Date: 05/25/2018 CLINICAL DATA:  Syncopal episode. EXAM: CHEST - 2 VIEW COMPARISON:  05/14/2011. FINDINGS: Mediastinum and hilar structures normal. Stable calcified mediastinal lymph node again noted, most likely from prior granulomas disease. Heart size normal. No focal infiltrate. No pleural effusion or pneumothorax. No acute bony abnormality. IMPRESSION: No acute cardiopulmonary disease. Electronically Signed   By: Marcello Moores  Register   On: 05/25/2018 12:58   Ct Head Wo Contrast  Result Date: 05/25/2018 CLINICAL DATA:  Syncope with loss of consciousness. EXAM: CT HEAD WITHOUT CONTRAST TECHNIQUE: Contiguous axial images were obtained from the base of the skull through the vertex without intravenous contrast. COMPARISON:  March 03, 2016 FINDINGS: Brain: Mild diffuse atrophy is stable. There is no intracranial mass, hemorrhage, extra-axial fluid collection, or midline shift. There is rather minimal periventricular small vessel disease. No acute infarct evident. Vascular: No hyperdense vessel. There is slight calcification in the right cavernous carotid artery. Skull: Patient has had a previous right frontal craniotomy. Bony calvarium otherwise is intact, although the bones are somewhat thinned, a stable finding. Sinuses/Orbits: There is mucosal thickening in several ethmoid air cells. Other visualized paranasal sinuses are clear. Visualized orbits appear symmetric bilaterally. Other: Mastoid air cells are clear. IMPRESSION: Mild atrophy with minimal small vessel  disease. No acute infarct. No mass or hemorrhage. Slight calcification noted in the right cavernous carotid artery. Previous right frontal craniotomy. Mucosal thickening in several ethmoid air cells. Electronically Signed   By: Lowella Grip III M.D.   On: 05/25/2018 13:49    EKG EKG Interpretation  Date/Time:  Friday May 25 2018  11:03:47 EDT Ventricular Rate:  93 PR Interval:    QRS Duration: 85 QT Interval:  333 QTC Calculation: 415 R Axis:   59 Text Interpretation:  Sinus rhythm Probable left atrial enlargement Low voltage, extremity leads Baseline wander in lead(s) V3 Confirmed by Lajean Saver [phone]) on 05/25/2018 12:01:50 PM   Radiology Dg Chest 2 View  Result Date: 05/25/2018 CLINICAL DATA:  Syncopal episode. EXAM: CHEST - 2 VIEW COMPARISON:  05/14/2011. FINDINGS: Mediastinum and hilar structures normal. Stable calcified mediastinal lymph node again noted, most likely from prior granulomas disease. Heart size normal. No focal infiltrate. No pleural effusion or pneumothorax. No acute bony abnormality. IMPRESSION: No acute cardiopulmonary disease. Electronically Signed   By: Marcello Moores  Register   On: 05/25/2018 12:58   Ct Head Wo Contrast  Result Date: 05/25/2018 CLINICAL DATA:  Syncope with loss of consciousness. EXAM: CT HEAD WITHOUT CONTRAST TECHNIQUE: Contiguous axial images were obtained from the base of the skull through the vertex without intravenous contrast. COMPARISON:  March 03, 2016 FINDINGS: Brain: Mild diffuse atrophy is stable. There is no intracranial mass, hemorrhage, extra-axial fluid collection, or midline shift. There is rather minimal periventricular small vessel disease. No acute infarct evident. Vascular: No hyperdense vessel. There is slight calcification in the right cavernous carotid artery. Skull: Patient has had a previous right frontal craniotomy. Bony calvarium otherwise is intact, although the bones are somewhat thinned, a stable finding. Sinuses/Orbits:  There is mucosal thickening in several ethmoid air cells. Other visualized paranasal sinuses are clear. Visualized orbits appear symmetric bilaterally. Other: Mastoid air cells are clear. IMPRESSION: Mild atrophy with minimal small vessel disease. No acute infarct. No mass or hemorrhage. Slight calcification noted in the right cavernous carotid artery. Previous right frontal craniotomy. Mucosal thickening in several ethmoid air cells. Electronically Signed   By: Lowella Grip III M.D.   On: 05/25/2018 13:49    Procedures Procedures (including critical care time)  Medications Ordered in ED Medications - No data to display   Initial Impression / Assessment and Plan / ED Course  I have reviewed the triage vital signs and the nursing notes.  Pertinent labs & imaging results that were available during my care of the patient were reviewed by me and considered in my medical decision making (see chart for details).  Iv ns. Monitor. Ecg. Labs. Imaging.   Reviewed nursing notes and prior charts for additional history.   Cultures sent.   Po fluids.  Orthostatic vitals normal.   Recheck vitals sins normal. bp norma. Hr 78. rr 16.  xrays reviewed - no pna. Ct neg acute intracranial, ?sinusitis.   Discussed results w pt. Feels improved.   Ultram po. Po fluids, po food.   augmentin po.   Pt currently feels improved and stable for d/c.   Return precautions provided.     Final Clinical Impressions(s) / ED Diagnoses   Final diagnoses:  None    ED Discharge Orders    None       Lajean Saver, MD 05/25/18 1546

## 2018-05-25 NOTE — ED Notes (Signed)
Orthostatic VS done at 1135. Steinl,MD made aware.

## 2018-05-30 LAB — CULTURE, BLOOD (ROUTINE X 2)
CULTURE: NO GROWTH
CULTURE: NO GROWTH
SPECIAL REQUESTS: ADEQUATE
Special Requests: ADEQUATE

## 2018-05-31 ENCOUNTER — Other Ambulatory Visit: Payer: Self-pay | Admitting: Internal Medicine

## 2018-05-31 DIAGNOSIS — J302 Other seasonal allergic rhinitis: Secondary | ICD-10-CM

## 2018-05-31 DIAGNOSIS — J0110 Acute frontal sinusitis, unspecified: Secondary | ICD-10-CM

## 2018-06-06 ENCOUNTER — Other Ambulatory Visit: Payer: Self-pay | Admitting: Family Medicine

## 2018-07-19 ENCOUNTER — Other Ambulatory Visit: Payer: Self-pay

## 2018-07-19 DIAGNOSIS — J0110 Acute frontal sinusitis, unspecified: Secondary | ICD-10-CM

## 2018-07-19 DIAGNOSIS — J302 Other seasonal allergic rhinitis: Secondary | ICD-10-CM

## 2018-07-19 MED ORDER — FLUTICASONE PROPIONATE 50 MCG/ACT NA SUSP: NASAL | 0 refills | Status: DC

## 2018-10-12 ENCOUNTER — Other Ambulatory Visit: Payer: Self-pay | Admitting: Family Medicine

## 2018-10-17 ENCOUNTER — Other Ambulatory Visit: Payer: Self-pay | Admitting: Family Medicine

## 2018-11-22 ENCOUNTER — Ambulatory Visit (INDEPENDENT_AMBULATORY_CARE_PROVIDER_SITE_OTHER): Payer: Medicare Other | Admitting: Family Medicine

## 2018-11-22 ENCOUNTER — Encounter: Payer: Self-pay | Admitting: Family Medicine

## 2018-11-22 ENCOUNTER — Other Ambulatory Visit: Payer: Self-pay

## 2018-11-22 VITALS — BP 118/68 | HR 68 | Temp 97.7°F | Ht 67.0 in | Wt 180.0 lb

## 2018-11-22 DIAGNOSIS — L821 Other seborrheic keratosis: Secondary | ICD-10-CM

## 2018-11-22 DIAGNOSIS — E785 Hyperlipidemia, unspecified: Secondary | ICD-10-CM

## 2018-11-22 DIAGNOSIS — N401 Enlarged prostate with lower urinary tract symptoms: Secondary | ICD-10-CM | POA: Diagnosis not present

## 2018-11-22 DIAGNOSIS — R350 Frequency of micturition: Secondary | ICD-10-CM | POA: Diagnosis not present

## 2018-11-22 DIAGNOSIS — Z0000 Encounter for general adult medical examination without abnormal findings: Secondary | ICD-10-CM | POA: Diagnosis not present

## 2018-11-22 MED ORDER — TAMSULOSIN HCL 0.4 MG PO CAPS: 0.4000 mg | ORAL_CAPSULE | Freq: Every day | ORAL | 11 refills | Status: DC

## 2018-11-22 NOTE — Patient Instructions (Addendum)
It was nice meeting you today Frederick Knight!  Today, we discussed the skin lesion on your forehead, which is a benign finding called a seborrheic keratosis.  If this lesion starts to get bigger or starts to bleed frequently, please let me know.  We are also starting a new medication for your benign prostatic hypertrophy.  This medication is called Flomax, and you should take it once per day.  We can go up on the dose later if you need to.  We are going to check your cholesterol today.  I will call you if this result is abnormal.  If you have any questions or concerns, please feel free to call the clinic.   Be well,  Dr. Shan Levans

## 2018-11-22 NOTE — Progress Notes (Signed)
Subjective:    Frederick Knight - 67 y.o. male MRN [id number]  Date of birth: Mar 08, 1951  CC   Frederick Knight is here for an annual physical, he would also like to discuss a skin lesion and frequent urination.  HPI  Skin lesion on forehead -Is not sure how long it has been present, likely for over a year -Does not itch or bleed and is not painful  Frequent urination -Has noted that he needs to urinate frequently -Urinates twice on an average night, but finds that during the day, he urinates more frequently than he used to -Does not feel that he necessarily fully empties his bladder each time he urinates -Got his PSA checked last year at a screening clinic and was told that it was normal  Review of Systems  Constitutional: Negative for fever and weight loss.  HENT: Negative for congestion.   Respiratory: Negative for cough.   Cardiovascular: Negative for chest pain.  Gastrointestinal: Negative for abdominal pain, constipation and diarrhea.  Genitourinary: Positive for frequency. Negative for dysuria and urgency.  Skin: Negative for itching and rash.  Neurological: Negative for headaches.  Psychiatric/Behavioral: The patient is not nervous/anxious.     Health Maintenance:  Health Maintenance Due  Topic Date Due  . Hepatitis C Screening  May 03, 1951    -  reports that he has never smoked. He has never used smokeless tobacco. - Review of Systems: Per HPI. - Past Medical History: Patient Active Problem List   Diagnosis Date Noted  . GERD (gastroesophageal reflux disease) 04/13/2017  . Blurry vision, bilateral 04/13/2017  . Solar lentigo 10/26/2016  . Renal cyst, right 10/09/2016  . Seasonal allergies 03/13/2015  . History of colonic polyps   . Abdominal pain 01/06/2015  . Encounter for physical examination 10/15/2014  . Acute frontal sinusitis 09/26/2013  . Dizziness 08/28/2013  . 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 Hyperlipidemia 03/19/2013  . BPH (benign  prostatic hyperplasia) 01/18/2013   - Medications: reviewed and updated   Objective:   Physical Exam BP 118/68   Pulse 68   Temp 97.7 F (36.5 C) (Oral)   Ht 5\' 7"  (1.702 m)   Wt 180 lb (81.6 kg)   SpO2 98%   BMI 28.19 kg/m  Gen: NAD, alert, cooperative with exam, well-appearing, pleasant HEENT: NCAT, clear conjunctiva, supple neck CV: RRR, good S1/S2, no murmur, no edema Resp: CTABL, no wheezes, non-labored Abd: SNTND, BS present, no guarding or organomegaly Skin: Tan, stuck-on lesion on forehead without irritation or signs of infection Neuro: no gross deficits.  Psych: good insight, alert and oriented        Assessment & Plan:   Seborrheic keratosis Reassured patient that this is a seborrheic keratosis and is not a concern, unless it becomes a bother him, and then it can be removed with cryotherapy.  He elects not to do an intervention at this time.  BPH (benign prostatic hyperplasia) Advised patient that the best recommendations we have so far support PSA testing every 2 years, which would mean he would need to be tested next year if he is interested.  He is interested in getting tested next year.  Also prescribed Flomax 0.4 mg daily to help with patient's urinary frequency.  Advised patient that we can increase this dose in the future if he is interested.  Hyperlipidemia Currently taking Crestor 10 mg daily.  Will obtain lipid panel today.  He is up-to-date on all other labs.    Maia Breslow, M.D.  11/23/2018, 8:04 AM PGY-2, [place]

## 2018-11-23 ENCOUNTER — Encounter: Payer: Self-pay | Admitting: Family Medicine

## 2018-11-23 LAB — LIPID PANEL
CHOL/HDL RATIO: 3.6 ratio (ref 0.0–5.0)
Cholesterol, Total: 160 mg/dL (ref 100–199)
HDL: 45 mg/dL (ref >39)
LDL Calculated: 83 mg/dL (ref 0–99)
Triglycerides: 162 mg/dL — ABNORMAL HIGH (ref 0–149)
VLDL Cholesterol Cal: 32 mg/dL (ref 5–40)

## 2018-11-23 NOTE — Assessment & Plan Note (Signed)
Advised patient that the best recommendations we have so far support PSA testing every 2 years, which would mean he would need to be tested next year if he is interested.  He is interested in getting tested next year.  Also prescribed Flomax 0.4 mg daily to help with patient's urinary frequency.  Advised patient that we can increase this dose in the future if he is interested.

## 2018-11-23 NOTE — Assessment & Plan Note (Signed)
Currently taking Crestor 10 mg daily.  Will obtain lipid panel today.  He is up-to-date on all other labs.

## 2018-11-23 NOTE — Assessment & Plan Note (Addendum)
Reassured patient that this is a seborrheic keratosis and is not a concern, unless it becomes a bother him, and then it can be removed with cryotherapy.  He elects not to do an intervention at this time.

## 2018-12-07 DIAGNOSIS — H02831 Dermatochalasis of right upper eyelid: Secondary | ICD-10-CM | POA: Diagnosis not present

## 2018-12-07 DIAGNOSIS — H02834 Dermatochalasis of left upper eyelid: Secondary | ICD-10-CM | POA: Diagnosis not present

## 2018-12-07 DIAGNOSIS — H2513 Age-related nuclear cataract, bilateral: Secondary | ICD-10-CM | POA: Diagnosis not present

## 2018-12-07 DIAGNOSIS — H40033 Anatomical narrow angle, bilateral: Secondary | ICD-10-CM | POA: Diagnosis not present

## 2018-12-07 DIAGNOSIS — H40013 Open angle with borderline findings, low risk, bilateral: Secondary | ICD-10-CM | POA: Diagnosis not present

## 2019-01-15 ENCOUNTER — Other Ambulatory Visit: Payer: Self-pay

## 2019-01-15 ENCOUNTER — Other Ambulatory Visit: Payer: Self-pay | Admitting: Family Medicine

## 2019-01-15 MED ORDER — RANITIDINE HCL 150 MG PO TABS: 150.0000 mg | ORAL_TABLET | Freq: Two times a day (BID) | ORAL | 2 refills | Status: DC

## 2019-01-15 NOTE — Telephone Encounter (Signed)
Pt called nurse line stating he takes his zantac 2x daily, and his recent prescription was only written for 1x daily. Pt states he needs a 83mon supply, because his insurance will charge him the same no matter how its written. Please advise.

## 2019-02-26 ENCOUNTER — Other Ambulatory Visit: Payer: Self-pay | Admitting: *Deleted

## 2019-02-26 MED ORDER — KETOCONAZOLE 2 % EX CREA: 1.0000 "application " | TOPICAL_CREAM | Freq: Every day | CUTANEOUS | 1 refills | Status: DC | PRN

## 2019-04-16 ENCOUNTER — Telehealth: Payer: Self-pay

## 2019-04-16 ENCOUNTER — Other Ambulatory Visit: Payer: Self-pay | Admitting: Family Medicine

## 2019-04-16 MED ORDER — FAMOTIDINE 20 MG PO TABS: 20.0000 mg | ORAL_TABLET | Freq: Two times a day (BID) | ORAL | 11 refills | Status: DC

## 2019-04-16 NOTE — Telephone Encounter (Signed)
Pt requesting new Rx to replace Zantac. Pt was taking Zantac 2 x daily. Please send in Rx to [facility] [place] Ottis Stain, CMA

## 2019-04-16 NOTE — Telephone Encounter (Signed)
Rx for famotidine sent in.  Thanks!

## 2019-06-07 ENCOUNTER — Other Ambulatory Visit: Payer: Self-pay

## 2019-06-07 ENCOUNTER — Ambulatory Visit (INDEPENDENT_AMBULATORY_CARE_PROVIDER_SITE_OTHER): Payer: Medicare Other | Admitting: Family Medicine

## 2019-06-07 ENCOUNTER — Encounter: Payer: Self-pay | Admitting: Family Medicine

## 2019-06-07 VITALS — BP 136/60 | HR 71 | Temp 97.8°F | Wt 174.4 lb

## 2019-06-07 DIAGNOSIS — M940 Chondrocostal junction syndrome [Tietze]: Secondary | ICD-10-CM | POA: Diagnosis not present

## 2019-06-07 DIAGNOSIS — Z0000 Encounter for general adult medical examination without abnormal findings: Secondary | ICD-10-CM

## 2019-06-07 DIAGNOSIS — R42 Dizziness and giddiness: Secondary | ICD-10-CM

## 2019-06-07 DIAGNOSIS — Z1159 Encounter for screening for other viral diseases: Secondary | ICD-10-CM | POA: Diagnosis not present

## 2019-06-07 LAB — POCT GLYCOSYLATED HEMOGLOBIN (HGB A1C): Hemoglobin A1C: 6.6 % — AB (ref 4.0–5.6)

## 2019-06-07 MED ORDER — MECLIZINE HCL 12.5 MG PO TABS: 12.5000 mg | ORAL_TABLET | Freq: Three times a day (TID) | ORAL | 0 refills | Status: DC | PRN

## 2019-06-07 NOTE — Progress Notes (Signed)
Subjective:    Frederick Knight - 68 y.o. male MRN [id number]  Date of birth: 1951-07-07  CC:  Frederick Knight is here for his annual physical.  He would also like to discuss left side pain and dizziness.  HPI: L side pain Started three weeks ago Described as sharp Lying on L side, coughing, and deep breathing exacerbate pain Did not fall, but came down hard on L leg around three weeks ago Advil can alleviate pain  Dizziness Exacerbated by lying down suddenly or getting up suddenly Was prescribed meclizine in the past for dizziness and seemed to help Room spins, which is new for him Denies nausea or hearing loss  Health Maintenance:  There are no preventive care reminders to display for this patient.  -  reports that he has never smoked. He has never used smokeless tobacco. - Review of Systems: Per HPI. - Past Medical History: Patient Active Problem List   Diagnosis Date Noted  . Costochondritis, acute 06/07/2019  . GERD (gastroesophageal reflux disease) 04/13/2017  . Blurry vision, bilateral 04/13/2017  . Solar lentigo 10/26/2016  . Renal cyst, right 10/09/2016  . Seasonal allergies 03/13/2015  . History of colonic polyps   . Abdominal pain 01/06/2015  . Encounter for physical examination 10/15/2014  . Acute frontal sinusitis 09/26/2013  . Dizziness 08/28/2013  . 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 Hyperlipidemia 03/19/2013  . BPH (benign prostatic hyperplasia) 01/18/2013   - Medications: reviewed and updated   Objective:   Physical Exam BP 136/60   Pulse 71   Temp 97.8 F (36.6 C) (Oral)   Wt 174 lb 6.4 oz (79.1 kg)   SpO2 98%   BMI 27.31 kg/m  Gen: NAD, alert, cooperative with exam, well-appearing CV: RRR, good S1/S2, no murmur, no edema, capillary refill brisk  Resp: CTABL, no wheezes, non-labored Abd: SNTND, BS present, no guarding or organomegaly Skin: no rashes along left side or elsewhere Musculoskeletal: No tenderness to palpation of left side,  no ecchymosis noted Neuro: no gross deficits.  Psych: good insight, alert and oriented  No nystagmus or subjective dizziness with Dix-Hallpike maneuver, but exam was limited by position of the table.        Assessment & Plan:   Costochondritis, acute History and physical support a musculoskeletal cause of patient's pain.  Initially worried for the possibility of shingles given his dermatomal distribution, but there are no skin changes, and pain is described as aching rather than burning.  Advised patient to take Tylenol for pain control and to let us know if he develops any rashes in the area.  Dizziness Likely either BPV or vertigo of another type.  Sounds similar to dizziness described in the past.  Less worried about orthostatic hypotension since his symptoms do not only occur when rising from a seated position.  Renewed patient's meclizine prescription and asked him to take it only if he develops symptoms.  Warned patient that it can cause some dry mouth.  He will let us know if his dizziness progresses or if the meclizine is not helpful for him.  Encounter for physical examination Patient is up-to-date on all screening except for hepatitis C screen, which we will complete today.  We will also perform a BMP, hemoglobin A1c, and CBC.  Counseled patient on the PSA test today.  His last one was performed in 2014, so he may benefit from a recheck today.  He is interested in this, so we will obtain this test today.  Maia Breslow, M.D. 06/07/2019, 1:47 PM PGY-2, [place]

## 2019-06-07 NOTE — Patient Instructions (Addendum)
It was nice seeing you today Frederick Knight!  Today, we are getting several labs, including checking your blood count, kidney function, electrolytes, prostate cancer screen, and hepatitis C screen.  Your cholesterol was checked in December, so you do not need this checked today.  I will contact you if any of these labs are abnormal.  I believe your left side pain is from muscle strain.  You can take Tylenol to help with this pain and use ice or heat, whichever is more comfortable for you.  If you develop any rash in that area, please let me know.  I have refilled your meclizine for your dizziness.  If this is not helpful and your dizziness worsens, please come in to discuss this further.  If you have any questions or concerns, please feel free to call the clinic.   Be well,  Dr. Shan Levans

## 2019-06-07 NOTE — Assessment & Plan Note (Signed)
Likely either BPV or vertigo of another type.  Sounds similar to dizziness described in the past.  Less worried about orthostatic hypotension since his symptoms do not only occur when rising from a seated position.  Renewed patient's meclizine prescription and asked him to take it only if he develops symptoms.  Warned patient that it can cause some dry mouth.  He will let us know if his dizziness progresses or if the meclizine is not helpful for him.

## 2019-06-07 NOTE — Assessment & Plan Note (Signed)
Patient is up-to-date on all screening except for hepatitis C screen, which we will complete today.  We will also perform a BMP, hemoglobin A1c, and CBC.  Counseled patient on the PSA test today.  His last one was performed in 2014, so he may benefit from a recheck today.  He is interested in this, so we will obtain this test today.

## 2019-06-07 NOTE — Assessment & Plan Note (Signed)
History and physical support a musculoskeletal cause of patient's pain.  Initially worried for the possibility of shingles given his dermatomal distribution, but there are no skin changes, and pain is described as aching rather than burning.  Advised patient to take Tylenol for pain control and to let us know if he develops any rashes in the area.

## 2019-06-08 LAB — HEPATITIS C ANTIBODY: Hep C Virus Ab: 0.1 s/co ratio (ref 0.0–0.9)

## 2019-06-08 LAB — BASIC METABOLIC PANEL
BUN/Creatinine Ratio: 13 (ref 10–24)
BUN: 15 mg/dL (ref 8–27)
CO2: 22 mmol/L (ref 20–29)
Calcium: 9.9 mg/dL (ref 8.6–10.2)
Chloride: 104 mmol/L (ref 96–106)
Creatinine, Ser: 1.16 mg/dL (ref 0.76–1.27)
GFR calc Af Amer: 74 mL/min/{1.73_m2} (ref >59)
GFR calc non Af Amer: 64 mL/min/{1.73_m2} (ref >59)
Glucose: 111 mg/dL — ABNORMAL HIGH (ref 65–99)
Potassium: 4.4 mmol/L (ref 3.5–5.2)
Sodium: 140 mmol/L (ref 134–144)

## 2019-06-08 LAB — CBC
Hematocrit: 48.4 % (ref 37.5–51.0)
Hemoglobin: 16.6 g/dL (ref 13.0–17.7)
MCH: 30.2 pg (ref 26.6–33.0)
MCHC: 34.3 g/dL (ref 31.5–35.7)
MCV: 88 fL (ref 79–97)
Platelets: 129 10*3/uL — ABNORMAL LOW (ref 150–450)
RBC: 5.5 x10E6/uL (ref 4.14–5.80)
RDW: 12.9 % (ref 11.6–15.4)
WBC: 5.7 10*3/uL (ref 3.4–10.8)

## 2019-06-08 LAB — PSA: Prostate Specific Ag, Serum: 2.2 ng/mL (ref 0.0–4.0)

## 2019-06-11 ENCOUNTER — Telehealth: Payer: Self-pay | Admitting: Family Medicine

## 2019-06-11 ENCOUNTER — Other Ambulatory Visit: Payer: Self-pay | Admitting: Family Medicine

## 2019-06-11 MED ORDER — METFORMIN HCL ER 500 MG PO TB24: 500.0000 mg | ORAL_TABLET | Freq: Every day | ORAL | 4 refills | Status: DC

## 2019-06-11 MED ORDER — METFORMIN HCL ER 500 MG PO TB24: 500.0000 mg | ORAL_TABLET | Freq: Every day | ORAL | 1 refills | Status: DC

## 2019-06-11 NOTE — Telephone Encounter (Signed)
Spoke with Mr. Coots regarding his results from his previous appointment with me.  His PSA, CBC, hepatitis C antibody, and BMP were all normal, but his hemoglobin A1c was elevated to 6.6, which puts him in the range for type 2 diabetes.  I reassured him that his type 2 diabetes is very well controlled, but he would benefit from starting metformin to ensure that it stays controlled.  I also encouraged him to reduce consumption of refined carbohydrates such as white rice, white bread, and sugary foods and beverages.  I told him that metformin is an effective medication, but it can cause some diarrhea, so I am going to start him on the lowest dose and we can discuss going up from there depending on how he tolerates it.  He is interested in the possibility of only taking this medication temporarily if he can get his A1c below 6.5, which I said is fine to do if he wishes.  I answered all of his medications regarding his diagnosis and initiation of metformin.  I encouraged him to call me to update me on this medication and to let me know if he has any difficulties obtaining or tolerating it.  Amanda C. Shan Levans, MD PGY-2, [facility] 06/11/2019 5:06 PM

## 2019-06-12 ENCOUNTER — Telehealth: Payer: Self-pay | Admitting: Family Medicine

## 2019-06-12 NOTE — Telephone Encounter (Signed)
Pt called requesting to get his previous lab results. Pt stated mail will be fine.

## 2019-06-13 ENCOUNTER — Telehealth: Payer: Self-pay | Admitting: *Deleted

## 2019-06-13 ENCOUNTER — Encounter: Payer: Self-pay | Admitting: *Deleted

## 2019-06-13 ENCOUNTER — Encounter: Payer: Self-pay | Admitting: Family Medicine

## 2019-06-13 NOTE — Telephone Encounter (Signed)
Labs printed and placed in mail to go out. April Zimmerman Rumple, CMA

## 2019-06-13 NOTE — Telephone Encounter (Signed)
-----   Message from Kathrene Alu, MD sent at 06/13/2019  3:11 PM EDT ----- Regarding: Letter with lab results Would you help me with printing off a letter with Mr. Frederick Knight lab results from 6/26 to mail to him?  These include his PSA, hepatitis C antibody, CBC, BMP, and hemoglobin A1c.  I told him these lab results over the phone and sent them out of my inbasket, so I do not know how to get them into letter format now that they are not in my inbasket.  Perhaps you can print them out to mail to him?  I would appreciate your help with this if you can figure out how to do it.Thank you! [name]

## 2019-06-13 NOTE — Progress Notes (Signed)
Opened in Error. Frederick Knight, CMA

## 2019-06-28 NOTE — Progress Notes (Signed)
Error

## 2019-07-26 DIAGNOSIS — H02831 Dermatochalasis of right upper eyelid: Secondary | ICD-10-CM | POA: Diagnosis not present

## 2019-07-26 DIAGNOSIS — H40033 Anatomical narrow angle, bilateral: Secondary | ICD-10-CM | POA: Diagnosis not present

## 2019-07-26 DIAGNOSIS — H40013 Open angle with borderline findings, low risk, bilateral: Secondary | ICD-10-CM | POA: Diagnosis not present

## 2019-07-26 DIAGNOSIS — H02834 Dermatochalasis of left upper eyelid: Secondary | ICD-10-CM | POA: Diagnosis not present

## 2019-07-26 DIAGNOSIS — H43811 Vitreous degeneration, right eye: Secondary | ICD-10-CM | POA: Diagnosis not present

## 2019-08-06 ENCOUNTER — Other Ambulatory Visit: Payer: Self-pay

## 2019-08-06 MED ORDER — ROSUVASTATIN CALCIUM 10 MG PO TABS: 10.0000 mg | ORAL_TABLET | Freq: Every day | ORAL | 3 refills | Status: DC

## 2019-10-11 DIAGNOSIS — H40033 Anatomical narrow angle, bilateral: Secondary | ICD-10-CM | POA: Diagnosis not present

## 2019-10-11 DIAGNOSIS — H2513 Age-related nuclear cataract, bilateral: Secondary | ICD-10-CM | POA: Diagnosis not present

## 2019-10-11 DIAGNOSIS — H43811 Vitreous degeneration, right eye: Secondary | ICD-10-CM | POA: Diagnosis not present

## 2019-10-11 DIAGNOSIS — H02831 Dermatochalasis of right upper eyelid: Secondary | ICD-10-CM | POA: Diagnosis not present

## 2019-10-11 DIAGNOSIS — H16223 Keratoconjunctivitis sicca, not specified as Sjogren's, bilateral: Secondary | ICD-10-CM | POA: Diagnosis not present

## 2019-12-11 ENCOUNTER — Ambulatory Visit (INDEPENDENT_AMBULATORY_CARE_PROVIDER_SITE_OTHER): Payer: Medicare Other | Admitting: Family Medicine

## 2019-12-11 ENCOUNTER — Other Ambulatory Visit: Payer: Self-pay

## 2019-12-11 VITALS — BP 120/75 | HR 72 | Temp 98.6°F | Wt 169.2 lb

## 2019-12-11 DIAGNOSIS — R1030 Lower abdominal pain, unspecified: Secondary | ICD-10-CM | POA: Diagnosis not present

## 2019-12-11 DIAGNOSIS — R102 Pelvic and perineal pain: Secondary | ICD-10-CM

## 2019-12-11 LAB — POCT URINALYSIS DIP (MANUAL ENTRY)
Bilirubin, UA: NEGATIVE
Glucose, UA: NEGATIVE mg/dL
Nitrite, UA: NEGATIVE
Protein Ur, POC: NEGATIVE mg/dL
Spec Grav, UA: 1.02 (ref 1.010–1.025)
Urobilinogen, UA: 2 E.U./dL — AB
pH, UA: 7 (ref 5.0–8.0)

## 2019-12-11 LAB — POCT UA - MICROSCOPIC ONLY

## 2019-12-11 NOTE — Progress Notes (Signed)
   [facility] Phone: [phone]     EMPEROR HIRTE - 68 y.o. male MRN [id number]  Date of birth: 1951-05-06  Subjective:   cc: stomach pain  HPI:  abd pain: 2 weeks of  stomach pain.  more severe at night when he lays down. Pain is diffuse. When asked if it is sharp or dull, he states it is a  'pushing' pain.  Comes and goes during the day.  Food does not make the pain better or worse.  It is worse when he wakes up from sleep to go to the bathroom (urinate).  Pain does not keep him up at night, only delays onset of sleep by a little bit.  Currently Taking flomax for the past year.  States the medication is working "better" earlier.  Currently wakes up twice a night to urinate..   No n/v/d/c.  Bowel movements are sometimes daily or every two to three days.  No black stools.  No rectal bleeding.  No blood in urine  Patient is prescribed Prilosec.  Has had 1-2 episodes of reflux in past two weeks.      ROS: See HPI for pertinent positives and negatives As Family history reviewed for today's visit. No changes.  -  reports that he has never smoked. He has never used smokeless tobacco.  Objective:   BP 120/75   Pulse 72   Temp 98.6 F (37 C)   Wt 169 lb 3.2 oz (76.7 kg)   SpO2 98%   BMI 26.50 kg/m  Gen: Alert and oriented.  Elderly male.  No acute distress. CV: Regular rate and rhythm.  No murmur. Resp: LCTAB. GI: Soft, diffuse upper quadrant and suprapubic region.  Normal bowel sounds.  Assessment/Plan:   Abdominal pain 2 weeks of abdominal pain.  Appears he had abdominal pain 3 years ago, whose etiology was never discovered.  Based on his history, it feels like it is more of a urinary system pain over GI pain, but is strange that his pain is also in the LUQ.  Reassuring that he has no reports of bleeding, recent colonoscopy and CT imaging did not reveal cancer or diverticuli or any other thing that would point to a GI cause of his pain.  Will get  urinalysis and urine culture as well as CBC, CMP, lipase for further evaluation.  Consider additional imaging such as bladder ultrasound in the future.     Clemetine Marker, MD PGY-2 [facility]

## 2019-12-11 NOTE — Patient Instructions (Addendum)
It was nice to see you again today,  I will get a few tests to work-up the cause of your abdominal pain.  I will call you with results when I get them.  Based on talking to you and doing an exam I think the pain is most likely caused by your bladder.  That is why I am getting a urinalysis.  You can try taking Tylenol for the pain but this is not a quick acting drug and may not help when you are trying to get back to sleep at night.  Hopefully this pain will resolve on its own, but if it does not resolve I will see you again in 3 weeks for follow-up.  Have a great day,  Clemetine Marker, MD

## 2019-12-12 LAB — CBC WITH DIFFERENTIAL
Basophils Absolute: 0.1 10*3/uL (ref 0.0–0.2)
Basos: 1 %
EOS (ABSOLUTE): 0.1 10*3/uL (ref 0.0–0.4)
Eos: 2 %
Hematocrit: 50.5 % (ref 37.5–51.0)
Hemoglobin: 17.4 g/dL (ref 13.0–17.7)
Immature Grans (Abs): 0 10*3/uL (ref 0.0–0.1)
Immature Granulocytes: 0 %
Lymphocytes Absolute: 1.5 10*3/uL (ref 0.7–3.1)
Lymphs: 27 %
MCH: 30.6 pg (ref 26.6–33.0)
MCHC: 34.5 g/dL (ref 31.5–35.7)
MCV: 89 fL (ref 79–97)
Monocytes Absolute: 0.5 10*3/uL (ref 0.1–0.9)
Monocytes: 9 %
Neutrophils Absolute: 3.4 10*3/uL (ref 1.4–7.0)
Neutrophils: 61 %
RBC: 5.69 x10E6/uL (ref 4.14–5.80)
RDW: 12.8 % (ref 11.6–15.4)
WBC: 5.5 10*3/uL (ref 3.4–10.8)

## 2019-12-12 LAB — COMPREHENSIVE METABOLIC PANEL
ALT: 18 IU/L (ref 0–44)
AST: 19 IU/L (ref 0–40)
Albumin/Globulin Ratio: 1.8 (ref 1.2–2.2)
Albumin: 5.1 g/dL — ABNORMAL HIGH (ref 3.8–4.8)
Alkaline Phosphatase: 87 IU/L (ref 39–117)
BUN/Creatinine Ratio: 11 (ref 10–24)
BUN: 13 mg/dL (ref 8–27)
Bilirubin Total: 0.6 mg/dL (ref 0.0–1.2)
CO2: 22 mmol/L (ref 20–29)
Calcium: 10.2 mg/dL (ref 8.6–10.2)
Chloride: 103 mmol/L (ref 96–106)
Creatinine, Ser: 1.15 mg/dL (ref 0.76–1.27)
GFR calc Af Amer: 75 mL/min/{1.73_m2} (ref >59)
GFR calc non Af Amer: 65 mL/min/{1.73_m2} (ref >59)
Globulin, Total: 2.9 g/dL (ref 1.5–4.5)
Glucose: 81 mg/dL (ref 65–99)
Potassium: 4 mmol/L (ref 3.5–5.2)
Sodium: 141 mmol/L (ref 134–144)
Total Protein: 8 g/dL (ref 6.0–8.5)

## 2019-12-12 LAB — LIPASE: Lipase: 28 U/L (ref 13–78)

## 2019-12-12 NOTE — Assessment & Plan Note (Signed)
2 weeks of abdominal pain.  Appears he had abdominal pain 3 years ago, whose etiology was never discovered.  Based on his history, it feels like it is more of a urinary system pain over GI pain, but is strange that his pain is also in the LUQ.  Reassuring that he has no reports of bleeding, recent colonoscopy and CT imaging did not reveal cancer or diverticuli or any other thing that would point to a GI cause of his pain.  Will get urinalysis and urine culture as well as CBC, CMP, lipase for further evaluation.  Consider additional imaging such as bladder ultrasound in the future.

## 2019-12-13 LAB — URINE CULTURE

## 2019-12-17 ENCOUNTER — Telehealth: Payer: Self-pay | Admitting: Family Medicine

## 2019-12-17 NOTE — Telephone Encounter (Signed)
Called pt to inform him of urine culture results.  Only 25-50k colonies of an uncommon bacteria.  Was unable to speak with him, left voicemail telling him to call us back to let us know if he was still symptomatic.  If still symptomatic we can treat him with antibiotics.  Otherwise, we will hold off on treatment.

## 2019-12-19 NOTE — Telephone Encounter (Signed)
Patient calls nurse stating he is still experiencing urinary symptoms. Patient stated they are a little bit better, but still very much present. Patient would like treatment. Patient also stated he spoke with you about increasing his Flomax dose? Patient would like this as well. Please advise.

## 2019-12-20 ENCOUNTER — Other Ambulatory Visit: Payer: Self-pay | Admitting: Family Medicine

## 2019-12-20 MED ORDER — TAMSULOSIN HCL 0.4 MG PO CAPS: 0.8000 mg | ORAL_CAPSULE | Freq: Every day | ORAL | 11 refills | Status: DC

## 2019-12-20 MED ORDER — AMOXICILLIN-POT CLAVULANATE 875-125 MG PO TABS: 1.0000 | ORAL_TABLET | Freq: Two times a day (BID) | ORAL | 0 refills | Status: AC

## 2019-12-20 NOTE — Telephone Encounter (Signed)
Rx for 5 days of BID augmentin and tamsulosin was increased to 0.8.

## 2019-12-20 NOTE — Addendum Note (Signed)
Addended by: Dorna Bloom on: 12/20/2019 10:17 AM   Modules accepted: Orders

## 2019-12-20 NOTE — Progress Notes (Signed)
Pt urine culture had strep mitis, which is uncommon, with not a lot of information on typical abx regimens for UTI.  Called pt and he still was experiencing urinary symptoms and would like to be treated for UTI.  Given that this is a strep species, augmentin was chosen.  BID for 5 days.

## 2019-12-20 NOTE — Telephone Encounter (Signed)
Attempted to inform patient, however he did not answer and not a secure VM.  If he calls back please inform the medications we discussed yesterday are at his pharmacy.

## 2019-12-20 NOTE — Telephone Encounter (Signed)
Patient returns my call. I informed him of medications. Patient requesting Flomax to be a 90 day supply for insurance purposes. Please advise.

## 2020-01-14 ENCOUNTER — Ambulatory Visit: Payer: Medicare Other | Admitting: Family Medicine

## 2020-01-15 ENCOUNTER — Encounter: Payer: Self-pay | Admitting: Internal Medicine

## 2020-01-21 ENCOUNTER — Telehealth: Payer: Self-pay | Admitting: Internal Medicine

## 2020-01-21 NOTE — Telephone Encounter (Signed)
Lmom for pt to call me back. 

## 2020-01-21 NOTE — Telephone Encounter (Signed)
Pt received letter to set up NV prior to scheduling procedure. He would like to speak with nurse first about his insurance about what it'll pay before scheduling with [place]. I offered to make NV and explained there was no charge for that and the triage nurse would get his insurance information then, but he wants to wait until he speaks with nurse first. [phone]

## 2020-03-27 ENCOUNTER — Other Ambulatory Visit: Payer: Self-pay | Admitting: Family Medicine

## 2020-04-01 ENCOUNTER — Other Ambulatory Visit: Payer: Self-pay | Admitting: *Deleted

## 2020-04-01 MED ORDER — METFORMIN HCL ER 500 MG PO TB24: 500.0000 mg | ORAL_TABLET | Freq: Every day | ORAL | 3 refills | Status: DC

## 2020-07-09 DIAGNOSIS — R6889 Other general symptoms and signs: Secondary | ICD-10-CM | POA: Diagnosis not present

## 2020-07-09 DIAGNOSIS — Z03818 Encounter for observation for suspected exposure to other biological agents ruled out: Secondary | ICD-10-CM | POA: Diagnosis not present

## 2020-07-23 ENCOUNTER — Ambulatory Visit (INDEPENDENT_AMBULATORY_CARE_PROVIDER_SITE_OTHER): Payer: Medicare Other | Admitting: Student in an Organized Health Care Education/Training Program

## 2020-07-23 ENCOUNTER — Other Ambulatory Visit: Payer: Self-pay

## 2020-07-23 DIAGNOSIS — J069 Acute upper respiratory infection, unspecified: Secondary | ICD-10-CM | POA: Diagnosis not present

## 2020-07-23 MED ORDER — FLUTICASONE PROPIONATE 50 MCG/ACT NA SUSP: 2.0000 | Freq: Every day | NASAL | 6 refills | Status: DC

## 2020-07-23 MED ORDER — BENZONATATE 100 MG PO CAPS: 100.0000 mg | ORAL_CAPSULE | Freq: Two times a day (BID) | ORAL | 0 refills | Status: DC | PRN

## 2020-07-23 NOTE — Assessment & Plan Note (Signed)
Upper respiratory infection likely caused by nondescript virus as patient is Covid vaccinated and tested negative for Covid less than a week ago and has mild symptoms.  Unlikely bacterial given improvement in symptoms and no exudates -Flonase for sinus congestion -Honey for cough and Tessalon Perles as needed -Tylenol as needed for headache -Showed patient how to schedule Covid testing on his phone during appointment -Return if not improving by next week to evaluate for secondary bacterial infection

## 2020-07-23 NOTE — Patient Instructions (Signed)
It was a pleasure to see you today!  To summarize our discussion for this visit:  I am prescribing a nasal spray to help clear out your sinuses as well as a cough medicine to use as needed. First of all, I would recommend you try honey for the cough.  Please return early next week if you are not feeling better  Please go to the website that we looked up to schedule your COVID testing  Some additional health maintenance measures we should update are: Health Maintenance Due  Topic Date Due  . COVID-19 Vaccine (1) Never done  . INFLUENZA VACCINE  07/12/2020  .    Call the clinic at [phone] if your symptoms worsen or you have any concerns.   Thank you for allowing me to take part in your care,  Dr. Doristine Mango

## 2020-07-23 NOTE — Progress Notes (Signed)
   SUBJECTIVE:   CHIEF COMPLAINT / HPI: cough   COVID vaccinated 4 months ago. Returned back from [place] Saturday.  While traveling he wore his mask at all times in public and did not have any known sick contacts.  He tested COVID negative on Friday for travel back to the [place].  Sunday started dry coughing, Rhinorrhea- white/clear, and frontal headache. Used ice on forehead which helped as well as Tylenol which also helped somewhat. No throat or ear pain.  Denies fever, SOB, CP.  Cough has essentially resolved but is worse when lays down. Difficulty sleeping with the cough.  He felt much better yesterday.  Lives with 34 year old mom. Wants to get COVID tested again to protect her.  OBJECTIVE:   BP 134/80   Pulse 94   SpO2 96%   General: NAD, pleasant, well-appearing, able to participate in exam HEENT: Tenderness to percussion of frontal and nasal sinuses. Bilateral nares patent with mild clear drainage. Oropharynx negative for erythema, exudates, edema or lesions Neck positive for tender cervical lymphadenitis Cardiac: RRR, normal heart sounds, no murmurs. 2+ radial and PT pulses bilaterally Respiratory: CTAB, normal effort, No wheezes, rales or rhonchi Neuro: alert and oriented x4 Psych: Normal affect and mood  ASSESSMENT/PLAN:   URI (upper respiratory infection) Upper respiratory infection likely caused by nondescript virus as patient is Covid vaccinated and tested negative for Covid less than a week ago and has mild symptoms.  Unlikely bacterial given improvement in symptoms and no exudates -Flonase for sinus congestion -Honey for cough and Tessalon Perles as needed -Tylenol as needed for headache -Showed patient how to schedule Covid testing on his phone during appointment -Return if not improving by next week to evaluate for secondary bacterial infection     [place]

## 2020-07-28 ENCOUNTER — Telehealth: Payer: Self-pay

## 2020-07-28 NOTE — Telephone Encounter (Signed)
Patient calls nurse line regarding persistence of symptoms from last week. Patient is requesting an antibiotic to clear up infection. Advised patient that per provider note, they wanted to see him back if symptoms had not improved. Scheduled patient in ATC on Thursday afternoon. Patient requesting to start antibiotics as soon as possible to avoid additional visits. Informed that evaluation is typically indicated before starting abx therapy. Will forward to PCP and Dr. Ouida Sills.   Talbot Grumbling, RN

## 2020-07-30 ENCOUNTER — Ambulatory Visit: Payer: Medicare Other

## 2020-07-30 NOTE — Telephone Encounter (Signed)
I agree pt needs to be seen, if that's what the provider at his last visit stated.  It appears his appointment for Thursday afternoon has been canceled and there are no future appts listed at this time.  If pt calls back please let him know he will need to be seen.

## 2020-08-03 ENCOUNTER — Other Ambulatory Visit: Payer: Self-pay | Admitting: *Deleted

## 2020-08-04 MED ORDER — ROSUVASTATIN CALCIUM 10 MG PO TABS: 10.0000 mg | ORAL_TABLET | Freq: Every day | ORAL | 3 refills | Status: DC

## 2020-11-12 ENCOUNTER — Other Ambulatory Visit: Payer: Self-pay | Admitting: *Deleted

## 2020-11-13 MED ORDER — TAMSULOSIN HCL 0.4 MG PO CAPS: 0.8000 mg | ORAL_CAPSULE | Freq: Every day | ORAL | 1 refills | Status: DC

## 2020-11-16 ENCOUNTER — Telehealth: Payer: Self-pay | Admitting: Gastroenterology

## 2020-11-16 NOTE — Telephone Encounter (Signed)
Hey Dr Havery Moros, this is a former pt of [facility], he states he is due for another colonoscopy now , his last colonoscopy was done 2016, pt would like to establish with a new GI since we are closer to his home, records are in epic for review, please advise on scheduling.

## 2020-11-16 NOTE — Telephone Encounter (Signed)
Records reviewed. Dr. Buford Dresser recommended a 5 year follow up after his last exam in 01/2015. We can proceed with direct colonoscopy at the [facility] for surveillance purposes if no high risk comorbidities.

## 2020-11-27 NOTE — Telephone Encounter (Signed)
Left msg on pt's vm to call back 

## 2020-11-30 ENCOUNTER — Encounter: Payer: Self-pay | Admitting: Gastroenterology

## 2020-12-18 ENCOUNTER — Ambulatory Visit: Payer: Medicare Other | Admitting: Family Medicine

## 2020-12-18 ENCOUNTER — Ambulatory Visit (INDEPENDENT_AMBULATORY_CARE_PROVIDER_SITE_OTHER): Payer: Medicare Other | Admitting: Family Medicine

## 2020-12-18 ENCOUNTER — Encounter: Payer: Self-pay | Admitting: Family Medicine

## 2020-12-18 ENCOUNTER — Other Ambulatory Visit: Payer: Self-pay

## 2020-12-18 VITALS — BP 122/64 | HR 67 | Ht 67.0 in | Wt 166.0 lb

## 2020-12-18 DIAGNOSIS — R7309 Other abnormal glucose: Secondary | ICD-10-CM | POA: Diagnosis not present

## 2020-12-18 DIAGNOSIS — R350 Frequency of micturition: Secondary | ICD-10-CM

## 2020-12-18 DIAGNOSIS — R102 Pelvic and perineal pain: Secondary | ICD-10-CM

## 2020-12-18 LAB — POCT URINALYSIS DIP (MANUAL ENTRY)
Bilirubin, UA: NEGATIVE
Glucose, UA: NEGATIVE mg/dL
Ketones, POC UA: NEGATIVE mg/dL
Leukocytes, UA: NEGATIVE
Nitrite, UA: NEGATIVE
Protein Ur, POC: NEGATIVE mg/dL
Spec Grav, UA: 1.025 (ref 1.010–1.025)
Urobilinogen, UA: 0.2 E.U./dL
pH, UA: 7.5 (ref 5.0–8.0)

## 2020-12-18 LAB — POCT GLYCOSYLATED HEMOGLOBIN (HGB A1C): Hemoglobin A1C: 5.6 % (ref 4.0–5.6)

## 2020-12-18 NOTE — Patient Instructions (Signed)
It was nice to see you again today,  I will do some more urine studies and if necessary refer you to urology for your symptoms.  I will need you to come back in sometime next week to give another urine sample for the cytology.  You can call us and schedule this at any time.  Your A1c is 5.6 which is much better. Continue taking your medication.  I would like to see you back in 6 weeks.  Have a great day,  Clemetine Marker, MD

## 2020-12-18 NOTE — Progress Notes (Signed)
    SUBJECTIVE:   CHIEF COMPLAINT / HPI:   Suprapubic pain: wakes up 3 times at night to urinate. Urinates about 3 times during the day. Does not have difficulty starting or stopping his urinary stream.  Does not feel like he has residual urine in bladder after voiding. No dysuria.  Has suprapubic pain mostly at night when he lies down.  He has not noticed an improvement in symptoms since increasing to 0.8mg  tamsulosin.   Patient has a colonoscopy scheduled for February.     PERTINENT  PMH / PSH: bph  OBJECTIVE:   BP 122/64   Pulse 67   Ht 5\' 7"  (1.702 m)   Wt 166 lb (75.3 kg)   SpO2 97%   BMI 26.00 kg/m   Gen: alert, oriented.  Pleasant elderly male Cv: rrr.  No murmurs Abd: soft, nontender.  Normal bowel sounds.    ASSESSMENT/PLAN:   Suprapubic pain Chronic issue for patient.  Has nocturia.  Pain unlikely to be caused by distension from incomplete voiding given his history. Last psa was normal. Kidney function normal.  Differential includes painful bladder syndrome, malignancy, infection.  Will get urinalysis, culture and urine cytology.   Consider referral to urology depending on results.  Consider addition of finesteride in future.       Benay Pike, MD [place]

## 2020-12-20 DIAGNOSIS — R102 Pelvic and perineal pain: Secondary | ICD-10-CM | POA: Insufficient documentation

## 2020-12-20 LAB — URINE CULTURE

## 2020-12-20 NOTE — Assessment & Plan Note (Signed)
Chronic issue for patient.  Has nocturia.  Pain unlikely to be caused by distension from incomplete voiding given his history. Last psa was normal. Kidney function normal.  Differential includes painful bladder syndrome, malignancy, infection.  Will get urinalysis, culture and urine cytology.   Consider referral to urology depending on results.  Consider addition of finesteride in future.

## 2020-12-21 ENCOUNTER — Other Ambulatory Visit: Payer: Medicare Other

## 2020-12-21 ENCOUNTER — Other Ambulatory Visit: Payer: Self-pay

## 2020-12-21 DIAGNOSIS — R102 Pelvic and perineal pain: Secondary | ICD-10-CM

## 2020-12-31 LAB — GYN REPORT

## 2021-01-05 ENCOUNTER — Other Ambulatory Visit: Payer: Self-pay | Admitting: Family Medicine

## 2021-01-05 DIAGNOSIS — R102 Pelvic and perineal pain: Secondary | ICD-10-CM

## 2021-01-14 ENCOUNTER — Ambulatory Visit (AMBULATORY_SURGERY_CENTER): Payer: Self-pay | Admitting: *Deleted

## 2021-01-14 ENCOUNTER — Other Ambulatory Visit: Payer: Self-pay

## 2021-01-14 VITALS — Ht 67.0 in | Wt 168.0 lb

## 2021-01-14 DIAGNOSIS — Z86010 Personal history of colonic polyps: Secondary | ICD-10-CM

## 2021-01-14 MED ORDER — NA SULFATE-K SULFATE-MG SULF 17.5-3.13-1.6 GM/177ML PO SOLN: ORAL | 0 refills | Status: DC

## 2021-01-14 NOTE — Progress Notes (Signed)
Patient is here in-person for PV. Patient denies any allergies to eggs or soy. Patient denies any problems with anesthesia/sedation. Patient denies any oxygen use at home. Patient denies taking any diet/weight loss medications or blood thinners. Patient is not being treated for MRSA or C-diff. Patient is aware of our care-partner policy and [id number]-[id number] safety protocol. EMMI education assigned to the patient for the procedure, sent to [place].   COVID-19 vaccines completed on 09/2020 x3, per patient.

## 2021-01-26 ENCOUNTER — Encounter: Payer: Self-pay | Admitting: Gastroenterology

## 2021-01-27 ENCOUNTER — Encounter: Payer: Self-pay | Admitting: Certified Registered Nurse Anesthetist

## 2021-01-28 ENCOUNTER — Ambulatory Visit (AMBULATORY_SURGERY_CENTER): Payer: Medicare Other | Admitting: Gastroenterology

## 2021-01-28 ENCOUNTER — Other Ambulatory Visit: Payer: Self-pay

## 2021-01-28 ENCOUNTER — Encounter: Payer: Self-pay | Admitting: Gastroenterology

## 2021-01-28 VITALS — BP 133/87 | HR 64 | Temp 98.6°F | Resp 18 | Ht 67.0 in | Wt 168.0 lb

## 2021-01-28 DIAGNOSIS — D120 Benign neoplasm of cecum: Secondary | ICD-10-CM | POA: Diagnosis not present

## 2021-01-28 DIAGNOSIS — Z86010 Personal history of colonic polyps: Secondary | ICD-10-CM | POA: Diagnosis not present

## 2021-01-28 DIAGNOSIS — K5140 Inflammatory polyps of colon without complications: Secondary | ICD-10-CM | POA: Diagnosis not present

## 2021-01-28 DIAGNOSIS — Z1211 Encounter for screening for malignant neoplasm of colon: Secondary | ICD-10-CM | POA: Diagnosis not present

## 2021-01-28 HISTORY — PX: COLONOSCOPY: SHX174

## 2021-01-28 MED ORDER — SODIUM CHLORIDE 0.9 % IV SOLN: 500.0000 mL | Freq: Once | INTRAVENOUS | Status: DC

## 2021-01-28 NOTE — Progress Notes (Signed)
Called to room to assist during endoscopic procedure.  Patient ID and intended procedure confirmed with present staff. Received instructions for my participation in the procedure from the performing physician.  

## 2021-01-28 NOTE — Progress Notes (Signed)
Pt's states no medical or surgical changes since previsit or office visit.  ° °Vitals CW °

## 2021-01-28 NOTE — Progress Notes (Signed)
Report given to PACU, vss 

## 2021-01-28 NOTE — Patient Instructions (Signed)
Handouts given for polyps and hemorrhoids.  Await pathology results.  YOU HAD AN ENDOSCOPIC PROCEDURE TODAY AT [facility]:   Refer to the procedure report that was given to you for any specific questions about what was found during the examination.  If the procedure report does not answer your questions, please call your gastroenterologist to clarify.  If you requested that your care partner not be given the details of your procedure findings, then the procedure report has been included in a sealed envelope for you to review at your convenience later.  YOU SHOULD EXPECT: Some feelings of bloating in the abdomen. Passage of more gas than usual.  Walking can help get rid of the air that was put into your GI tract during the procedure and reduce the bloating. If you had a lower endoscopy (such as a colonoscopy or flexible sigmoidoscopy) you may notice spotting of blood in your stool or on the toilet paper. If you underwent a bowel prep for your procedure, you may not have a normal bowel movement for a few days.  Please Note:  You might notice some irritation and congestion in your nose or some drainage.  This is from the oxygen used during your procedure.  There is no need for concern and it should clear up in a day or so.  SYMPTOMS TO REPORT IMMEDIATELY:   Following lower endoscopy (colonoscopy or flexible sigmoidoscopy):  Excessive amounts of blood in the stool  Significant tenderness or worsening of abdominal pains  Swelling of the abdomen that is new, acute  Fever of 100F or higher  For urgent or emergent issues, a gastroenterologist can be reached at any hour by calling [phone]. Do not use MyChart messaging for urgent concerns.    DIET:  We do recommend a small meal at first, but then you may proceed to your regular diet.  Drink plenty of fluids but you should avoid alcoholic beverages for 24 hours.  ACTIVITY:  You should plan to take it easy for the rest of today  and you should NOT DRIVE or use heavy machinery until tomorrow (because of the sedation medicines used during the test).    FOLLOW UP: Our staff will call the number listed on your records 48-72 hours following your procedure to check on you and address any questions or concerns that you may have regarding the information given to you following your procedure. If we do not reach you, we will leave a message.  We will attempt to reach you two times.  During this call, we will ask if you have developed any symptoms of COVID 19. If you develop any symptoms (ie: fever, flu-like symptoms, shortness of breath, cough etc.) before then, please call [phone].  If you test positive for Covid 19 in the 2 weeks post procedure, please call and report this information to [place].    If any biopsies were taken you will be contacted by phone or by letter within the next 1-3 weeks.  Please call us at [phone] if you have not heard about the biopsies in 3 weeks.    SIGNATURES/CONFIDENTIALITY: You and/or your care partner have signed paperwork which will be entered into your electronic medical record.  These signatures attest to the fact that that the information above on your After Visit Summary has been reviewed and is understood.  Full responsibility of the confidentiality of this discharge information lies with you and/or your care-partner.

## 2021-01-28 NOTE — Op Note (Signed)
[facility] Patient Name: Frederick Knight Procedure Date: 01/28/2021 1:31 PM MRN: [id number] Endoscopist: Remo Lipps P. Havery Moros , MD Age: 70 Referring MD:  Date of Birth: [birth year] Gender: Male Account #: [id number] Procedure:                Colonoscopy Indications:              High risk colon cancer surveillance: Personal                            history of colonic polyps (last exam 01/2015- Dr.                            Buford Dresser) Medicines:                Monitored Anesthesia Care Procedure:                Pre-Anesthesia Assessment:                           - Prior to the procedure, a History and Physical                            was performed, and patient medications and                            allergies were reviewed. The patient's tolerance of                            previous anesthesia was also reviewed. The risks                            and benefits of the procedure and the sedation                            options and risks were discussed with the patient.                            All questions were answered, and informed consent                            was obtained. Prior Anticoagulants: The patient has                            taken no previous anticoagulant or antiplatelet                            agents. ASA Grade Assessment: II - A patient with                            mild systemic disease. After reviewing the risks                            and benefits, the patient was deemed in  satisfactory condition to undergo the procedure.                           After obtaining informed consent, the colonoscope                            was passed under direct vision. Throughout the                            procedure, the patient's blood pressure, pulse, and                            oxygen saturations were monitored continuously. The                            Olympus CF-HQ190L [id number]) Colonoscope was                             introduced through the anus and advanced to the the                            cecum, identified by appendiceal orifice and                            ileocecal valve. The colonoscopy was performed                            without difficulty. The patient tolerated the                            procedure well. The quality of the bowel                            preparation was adequate. The ileocecal valve,                            appendiceal orifice, and rectum were photographed. Scope In: 1:36:15 PM Scope Out: 1:51:39 PM Scope Withdrawal Time: 0 hours 11 minutes 56 seconds  Total Procedure Duration: 0 hours 15 minutes 24 seconds  Findings:                 The perianal and digital rectal examinations were                            normal.                           A 3 mm polyp was found in the cecum. The polyp was                            sessile. The polyp was removed with a cold snare.                            Resection and retrieval were complete.  The prep was generally adequate however there were                            small pockets of significant amount of residual                            seeds. This was most prominent in the cecal cap                            which could not be definitively cleared (clogged                            scope with seeds), although washed around and no                            obvious lesions noted. Small pockets seeds noted in                            the transverse colon and descending colon in patchy                            areas - hard to cleared but washed and no obvious                            lesions.                           Internal hemorrhoids were found during retroflexion.                           The exam was otherwise without abnormality. Complications:            No immediate complications. Estimated blood loss:                            Minimal. Estimated  Blood Loss:     Estimated blood loss was minimal. Impression:               - One 3 mm polyp in the cecum, removed with a cold                            snare. Resected and retrieved.                           - Residual seeds noted in the cecum, small parts of                            transverse and left colon.                           - Internal hemorrhoids.                           - The examination was otherwise normal. Recommendation:           -  Patient has a contact number available for                            emergencies. The signs and symptoms of potential                            delayed complications were discussed with the                            patient. Return to normal activities tomorrow.                            Written discharge instructions were provided to the                            patient.                           - Resume previous diet.                           - Continue present medications.                           - Await pathology results.                           - Recommend colonoscopy sooner than normal interval                            due to residual seeds in the colon which prohibited                            some views, will discuss with the patient Carlota Raspberry. Joah Patlan, MD 01/28/2021 1:59:27 PM This report has been signed electronically.

## 2021-02-01 ENCOUNTER — Telehealth: Payer: Self-pay | Admitting: *Deleted

## 2021-02-01 ENCOUNTER — Telehealth: Payer: Self-pay

## 2021-02-01 NOTE — Telephone Encounter (Signed)
Attempted to reach pt. With follow-up call following procedure 01/28/2021.  LM on pt. Answering machine.  Will attempt to reach pt. Again later today.

## 2021-02-01 NOTE — Telephone Encounter (Signed)
Attempted 2nd f/u phone call. No answer. Left message.  °

## 2021-03-09 ENCOUNTER — Other Ambulatory Visit: Payer: Self-pay | Admitting: *Deleted

## 2021-03-10 MED ORDER — METFORMIN HCL ER 500 MG PO TB24: 500.0000 mg | ORAL_TABLET | Freq: Every day | ORAL | 3 refills | Status: DC

## 2021-04-16 DIAGNOSIS — R3915 Urgency of urination: Secondary | ICD-10-CM | POA: Diagnosis not present

## 2021-04-16 DIAGNOSIS — R350 Frequency of micturition: Secondary | ICD-10-CM | POA: Diagnosis not present

## 2021-05-12 ENCOUNTER — Other Ambulatory Visit: Payer: Self-pay | Admitting: Family Medicine

## 2021-07-23 ENCOUNTER — Other Ambulatory Visit: Payer: Self-pay | Admitting: Family Medicine

## 2021-11-07 ENCOUNTER — Other Ambulatory Visit: Payer: Self-pay | Admitting: Family Medicine

## 2022-01-04 ENCOUNTER — Ambulatory Visit (INDEPENDENT_AMBULATORY_CARE_PROVIDER_SITE_OTHER): Payer: Medicare Other | Admitting: Family Medicine

## 2022-01-04 ENCOUNTER — Encounter: Payer: Self-pay | Admitting: Family Medicine

## 2022-01-04 ENCOUNTER — Other Ambulatory Visit: Payer: Self-pay

## 2022-01-04 VITALS — BP 140/67 | HR 67 | Wt 163.0 lb

## 2022-01-04 DIAGNOSIS — R234 Changes in skin texture: Secondary | ICD-10-CM | POA: Diagnosis not present

## 2022-01-04 DIAGNOSIS — E785 Hyperlipidemia, unspecified: Secondary | ICD-10-CM | POA: Diagnosis not present

## 2022-01-04 DIAGNOSIS — Z0000 Encounter for general adult medical examination without abnormal findings: Secondary | ICD-10-CM

## 2022-01-04 DIAGNOSIS — R7303 Prediabetes: Secondary | ICD-10-CM

## 2022-01-04 DIAGNOSIS — D696 Thrombocytopenia, unspecified: Secondary | ICD-10-CM

## 2022-01-04 DIAGNOSIS — E119 Type 2 diabetes mellitus without complications: Secondary | ICD-10-CM | POA: Diagnosis not present

## 2022-01-04 MED ORDER — ZOSTER VAC RECOMB ADJUVANTED 50 MCG/0.5ML IM SUSR: 0.5000 mL | Freq: Once | INTRAMUSCULAR | 0 refills | Status: AC

## 2022-01-04 NOTE — Patient Instructions (Addendum)
It was nice seeing you today!  Change dressing once a week.  Shingles vaccine at your pharmacy. Your second shot is due 2-6 months after the first shot.  Follow-up in 3 weeks for skin check.  Stay well, Zola Button, MD [facility] [phone]  --  Make sure to check out at the front desk before you leave today.  Please arrive at least 15 minutes prior to your scheduled appointments.  If you had blood work today, I will send you a MyChart message or a letter if results are normal. Otherwise, I will give you a call.  If you had a referral placed, they will call you to set up an appointment. Please give [place] a call if you don't hear back in the next 2 weeks.  If you need additional refills before your next appointment, please call your pharmacy first.

## 2022-01-04 NOTE — Progress Notes (Signed)
° ° °  SUBJECTIVE:   CHIEF COMPLAINT / HPI:  Chief Complaint  Patient presents with   rash on head    Place behind left ear, itching and will bleed when scratched    He is here today requesting routine blood work.  He would like his cholesterol, A1c, kidney function, and liver enzymes checked.  He is wondering if he still needs to take his metformin.  He also reports a scab on his head behind the left ear.  This has been present for a few months.  Area has been itchy, he will pick at the scab which will then start bleeding.  Then he will apply topical antibiotic cream but the scab will return.  PERTINENT  PMH / PSH: T2DM  Patient Care Team: Zola Button, MD as PCP - General (Family Medicine) Gala Romney Cristopher Estimable, MD as Consulting Physician (Gastroenterology)   OBJECTIVE:   BP 140/67    Pulse 67    Wt 163 lb (73.9 kg)    BMI 25.53 kg/m   Physical Exam Constitutional:      General: He is not in acute distress. Cardiovascular:     Rate and Rhythm: Normal rate and regular rhythm.  Pulmonary:     Effort: Pulmonary effort is normal. No respiratory distress.     Breath sounds: Normal breath sounds.  Neurological:     Mental Status: He is alert.  Derm: Posterior to left ear there is a approximately 1 cm hyperpigmented scab-like plaque associated with some crust but no surrounding erythema.  Scab was removed easily revealing a superficial ulcer.     Depression screen [facility] 2/9 01/04/2022  Decreased Interest 0  Down, Depressed, Hopeless 0  PHQ - 2 Score 0  Altered sleeping 0  Tired, decreased energy 0  Change in appetite 0  Feeling bad or failure about yourself  0  Trouble concentrating 1  Moving slowly or fidgety/restless 1  Suicidal thoughts 0  PHQ-9 Score 2  Difficult doing work/chores Not difficult at all     {Show previous vital signs (optional):23777}    ASSESSMENT/PLAN:   Skin lesion Ongoing for a few months which continues to scab over and has not healed.  Scab was  easily removed to reveal superficial ulcerated region.  Cannot rule out possibility of underlying skin malignancy and concerning that it has not healed completely despite ongoing for months.  Area was dressed with hydrocolloid dressing. - change dressing once weekly (extra dressing given to patient) - re-check in 3 weeks, consider further workup if no improvement  Prediabetes History of T2DM on metformin though last A1c was very well controlled at 5.6.  We will recheck today, if remains well controlled, consider discontinuing metformin.  Hyperlipidemia On statin, will update lipid panel   Thrombocytopenia Prior CBC with mild thrombocytopenia, will repeat CBC today.  HCM - shingles vaccine ordered to pharmacy - CMP (check liver enzymes per patient request)  Return in about 3 weeks (around 01/25/2022) for f/u wound.   Zola Button, MD [facility]

## 2022-01-05 ENCOUNTER — Encounter: Payer: Self-pay | Admitting: Family Medicine

## 2022-01-05 DIAGNOSIS — R7303 Prediabetes: Secondary | ICD-10-CM | POA: Insufficient documentation

## 2022-01-05 LAB — CBC
Hematocrit: 50.9 % (ref 37.5–51.0)
Hemoglobin: 17 g/dL (ref 13.0–17.7)
MCH: 30.1 pg (ref 26.6–33.0)
MCHC: 33.4 g/dL (ref 31.5–35.7)
MCV: 90 fL (ref 79–97)
Platelets: 146 10*3/uL — ABNORMAL LOW (ref 150–450)
RBC: 5.64 x10E6/uL (ref 4.14–5.80)
RDW: 12.8 % (ref 11.6–15.4)
WBC: 6.8 10*3/uL (ref 3.4–10.8)

## 2022-01-05 LAB — LIPID PANEL
Chol/HDL Ratio: 3.4 ratio (ref 0.0–5.0)
Cholesterol, Total: 151 mg/dL (ref 100–199)
HDL: 44 mg/dL (ref >39)
LDL Chol Calc (NIH): 84 mg/dL (ref 0–99)
Triglycerides: 129 mg/dL (ref 0–149)
VLDL Cholesterol Cal: 23 mg/dL (ref 5–40)

## 2022-01-05 LAB — COMPREHENSIVE METABOLIC PANEL
ALT: 32 IU/L (ref 0–44)
AST: 21 IU/L (ref 0–40)
Albumin/Globulin Ratio: 2 (ref 1.2–2.2)
Albumin: 5 g/dL — ABNORMAL HIGH (ref 3.7–4.7)
Alkaline Phosphatase: 78 IU/L (ref 44–121)
BUN/Creatinine Ratio: 12 (ref 10–24)
BUN: 12 mg/dL (ref 8–27)
Bilirubin Total: 0.4 mg/dL (ref 0.0–1.2)
CO2: 25 mmol/L (ref 20–29)
Calcium: 10.2 mg/dL (ref 8.6–10.2)
Chloride: 102 mmol/L (ref 96–106)
Creatinine, Ser: 1.03 mg/dL (ref 0.76–1.27)
Globulin, Total: 2.5 g/dL (ref 1.5–4.5)
Glucose: 68 mg/dL — ABNORMAL LOW (ref 70–99)
Potassium: 4.5 mmol/L (ref 3.5–5.2)
Sodium: 146 mmol/L — ABNORMAL HIGH (ref 134–144)
Total Protein: 7.5 g/dL (ref 6.0–8.5)
eGFR: 78 mL/min/{1.73_m2} (ref >59)

## 2022-01-05 LAB — HEMOGLOBIN A1C
Est. average glucose Bld gHb Est-mCnc: 126 mg/dL
Hgb A1c MFr Bld: 6 % — ABNORMAL HIGH (ref 4.8–5.6)

## 2022-01-05 NOTE — Assessment & Plan Note (Signed)
On statin, will update lipid panel

## 2022-01-05 NOTE — Assessment & Plan Note (Signed)
History of T2DM on metformin though last A1c was very well controlled at 5.6.  We will recheck today, if remains well controlled, consider discontinuing metformin.

## 2022-02-01 ENCOUNTER — Ambulatory Visit: Payer: Medicare Other | Admitting: Family Medicine

## 2022-02-03 ENCOUNTER — Encounter: Payer: Self-pay | Admitting: Student

## 2022-02-03 ENCOUNTER — Other Ambulatory Visit: Payer: Self-pay

## 2022-02-03 ENCOUNTER — Ambulatory Visit (INDEPENDENT_AMBULATORY_CARE_PROVIDER_SITE_OTHER): Payer: Medicare Other | Admitting: Student

## 2022-02-03 VITALS — BP 140/77 | HR 87 | Ht 67.0 in | Wt 164.1 lb

## 2022-02-03 DIAGNOSIS — L980 Pyogenic granuloma: Secondary | ICD-10-CM | POA: Diagnosis not present

## 2022-02-03 DIAGNOSIS — C4441 Basal cell carcinoma of skin of scalp and neck: Secondary | ICD-10-CM | POA: Insufficient documentation

## 2022-02-03 NOTE — Progress Notes (Signed)
° ° °  SUBJECTIVE:   CHIEF COMPLAINT / HPI: Follow-up for skin lesion  Patient was recently seen a month ago for a skin lesion at the back of his left ear.  Patient said the lesion is itchy and occassionally bleeds when he scratches it . He noticed the lesion about 3 months and it hasn't increased in size. Was recently seen a month ago and was advised to treat weekly with hydrocolloid dressing weekly which patient said was ineffective.   PERTINENT  PMH / PSH: GERD, HLD, BPH.  OBJECTIVE:   BP 140/77    Pulse 87    Ht 5\' 7"  (1.702 m)    Wt 164 lb 2 oz (74.4 kg)    SpO2 98%    BMI 25.71 kg/m    Physical Exam General: Alert, well appearing, NAD Cardiovascular: RRR, No Murmurs, Normal S2/S2 Respiratory: CTAB, No wheezing or Rales Extremities: No edema on extremities   Skin: 1cm raised glistening vascular lesion behind left ear (see picture below)      ASSESSMENT/PLAN:   Pyogenic Granuloma Patient reports no improvemt of skin lesion with hydrocolloid dressing.Lesion is itchy and bleeds with scratch. No change in size in over a month. On exam patient has a 1 cm raised glistening lesion at the back of the left eye that's highly suspicious of pyogenic granuloma. Lesion was treated with silver nitrate with plans to follow up in two weeks and if no improvement will biopsy lesion. Patient has been instructed to only apply Vaseline and avoid scratching the lesion.    Alen Bleacher, MD [facility]

## 2022-02-03 NOTE — Patient Instructions (Addendum)
No improvement with your skin lesion which is very suspicious of granuloma  Skin lesion was treated with silver nitrate with plan to follow up in 2 weeks. If no improvement will so a biopsy for further evaluation.  Follow up in 2 week  Pyogenic Granuloma  Pyogenic granuloma is a growth (lesion) that forms on the skin or on the mucous membranes of the mouth. This type of lesion is a lump of very red tissue that bleeds easily, is usually a single lesion, and most often affects: The head and neck. The mucous membranes of the mouth or tongue. The upper body. The hands and feet. A pyogenic granuloma usually measures about 0.2 inches (0.5 cm), but lesions can be smaller or larger. This condition does not spread from person to person (is not contagious). The lesion is noncancerous (benign). What are the causes? The cause of pyogenic granulomas is unknown, but the lesions commonly occur after a minor injury, such as pricking your skin or biting your lip or tongue. A mound of tiny blood vessels (capillaries) forms to create a lesion. Sometimes the lesions occur without an injury. What increases the risk? You are more likely to develop this condition if: You are pregnant. You are a child or young adult. You take certain medicines, including: Medicines for acne. Birth control pills. Some medicines used to treat cancer, HIV, or AIDS. What are the signs or symptoms? The main symptom of this condition is a raised or lumpy lesion that is very red. Your lesion may also: Have a crusty, broken, and irritated (ulcerated) surface. Bleed easily. Be slightly sore. How is this diagnosed? This condition is diagnosed based on your symptoms and medical history, especially if you recently had an injury. You may also have: A physical exam. A small piece of your granuloma removed for testing (biopsy) to rule out cancer. How is this treated? A small lesion may go away without treatment. You may have to stop or  change any medicines that caused your lesion. Pyogenic granulomas caused by pregnancy usually go away after delivery. This condition may also be treated by removing the lesion. This may be done if the lesion is large, irritated, or bleeds easily. Removal may involve: Curettage. This scrapes away the lesion. Using chemicals or electric energy to destroy the lesion. Surgical excision. This removes the lesion along with a small piece of normal skin or mucous membrane. This is the best treatment to prevent the lesion from coming back. Follow these instructions at home: Take over-the-counter and prescription medicines only as told by your health care provider. Do not scratch or pick at your lesion. Cover your lesion area with a bandage (dressing) or gauze to avoid having anything rub against your lesion. Keep your lesion clean to avoid infection. Keep all follow-up visits. This is important. Contact a health care provider if: You have a fever. Your lesion bleeds. Your lesion comes back after treatment. You have a lesion that grows rapidly or hardens. Summary Pyogenic granuloma is a growth (lesion) that forms on the skin or on the mucous membranes of the mouth. This condition does not spread from person to person (is not contagious). A small lesion may go away without treatment. If your lesion is large, irritated, or bleeds easily, you may need to have it removed. Do not scratch or pick at your lesion. Cover your lesion area with a bandage (dressing) or gauze to avoid having anything rub against your lesion. Keep your lesion clean to avoid infection. This  information is not intended to replace advice given to you by your health care provider. Make sure you discuss any questions you have with your health care provider. Document Revised: 03/25/2021 Document Reviewed: 03/25/2021 Elsevier Patient Education  [place].

## 2022-02-22 ENCOUNTER — Ambulatory Visit: Payer: Medicare Other | Admitting: Student

## 2022-02-24 ENCOUNTER — Ambulatory Visit (INDEPENDENT_AMBULATORY_CARE_PROVIDER_SITE_OTHER): Payer: Medicare Other | Admitting: Family Medicine

## 2022-02-24 ENCOUNTER — Encounter: Payer: Self-pay | Admitting: Family Medicine

## 2022-02-24 ENCOUNTER — Other Ambulatory Visit: Payer: Self-pay

## 2022-02-24 DIAGNOSIS — J302 Other seasonal allergic rhinitis: Secondary | ICD-10-CM | POA: Diagnosis not present

## 2022-02-24 DIAGNOSIS — L980 Pyogenic granuloma: Secondary | ICD-10-CM | POA: Diagnosis not present

## 2022-02-24 NOTE — Assessment & Plan Note (Signed)
Healing well.  Expect complete resolution.  FU one month.  Consider biopsy if not resolved.   ?

## 2022-02-24 NOTE — Patient Instructions (Signed)
You look good and are on good medicines. ?I believe you skin problem is healing nicely.  We can check it again in early April to make sure it has completely healed.  If it does not completely heal, we will want to take a biopsy. ?I think the cough is most likely due to mild allergies.  Since it is mild, you do not need to take anything.  If the symptoms bother you, you can take cetrizine/Zyrtec.  It is a good over the counter allergy medication.   ?

## 2022-02-24 NOTE — Progress Notes (Signed)
? ? ?  SUBJECTIVE:  ? ?CHIEF COMPLAINT / HPI:  ? ?FU pyogenic granuloma.  Healing well.  Not itching.  Has a brother/physician in [place] who wonders why it was not biopsied ? ?Also, small cough.  Just when he lays down at night.  Not productive.  No bleeding.  Does not feel sick.  Never smoker.  No nasal symptoms.  Has a history of springtime allergies.   ? ? ? ?OBJECTIVE:  ? ?BP 128/66   Pulse 88   Temp 98 ?F (36.7 ?C) (Oral)   Wt 166 lb 3.2 oz (75.4 kg)   SpO2 96%   BMI 26.03 kg/m?   ?Skin, well healing skin lesion.  I was the preceptor for his last visit, saw the lesion before silver nitrate applied and know that it is markedly improved. ?Neck, no significant nodes.   ?Lungs clear ? ?ASSESSMENT/PLAN:  ? ?Pyogenic granuloma ?Healing well.  Expect complete resolution.  FU one month.  Consider biopsy if not resolved.   ? ?Seasonal allergies ?Best fit clinical diagnosis is springtime allergies causing cough.  No further WU if it does not worsen.  Zyrtec at his discretion if symptoms are bothersome. ?  ? ? ?Zenia Resides, MD ?[place]  ?

## 2022-02-24 NOTE — Assessment & Plan Note (Signed)
Best fit clinical diagnosis is springtime allergies causing cough.  No further WU if it does not worsen.  Zyrtec at his discretion if symptoms are bothersome. ?

## 2022-03-02 ENCOUNTER — Other Ambulatory Visit: Payer: Self-pay

## 2022-03-02 MED ORDER — METFORMIN HCL ER 500 MG PO TB24: 500.0000 mg | ORAL_TABLET | Freq: Every day | ORAL | 3 refills | Status: DC

## 2022-03-24 ENCOUNTER — Ambulatory Visit: Payer: Medicare Other | Admitting: Family Medicine

## 2022-04-14 ENCOUNTER — Encounter: Payer: Self-pay | Admitting: Family Medicine

## 2022-04-14 ENCOUNTER — Ambulatory Visit (INDEPENDENT_AMBULATORY_CARE_PROVIDER_SITE_OTHER): Payer: Medicare Other | Admitting: Family Medicine

## 2022-04-14 ENCOUNTER — Other Ambulatory Visit: Payer: Self-pay | Admitting: Family Medicine

## 2022-04-14 DIAGNOSIS — N3941 Urge incontinence: Secondary | ICD-10-CM | POA: Diagnosis not present

## 2022-04-14 DIAGNOSIS — D492 Neoplasm of unspecified behavior of bone, soft tissue, and skin: Secondary | ICD-10-CM | POA: Diagnosis not present

## 2022-04-14 DIAGNOSIS — C4441 Basal cell carcinoma of skin of scalp and neck: Secondary | ICD-10-CM | POA: Diagnosis not present

## 2022-04-14 NOTE — Progress Notes (Signed)
? ? ?  SUBJECTIVE:  ? ?CHIEF COMPLAINT / HPI:  ? ?Two issues: ?Skin lesion on left occipital scalp has improved but not resolved.  Per my last note, if did not resolve, consider biopsy ?Vesicare prescribed by urology, expensive and does not seem to help.  Also on flomax.  He does not report any bladder outlet problems, urinary flow is OK.  His problem is holding his urine when the urge to pee hits. ? ? ? ?OBJECTIVE:  ? ?BP 138/77   Pulse 64   Ht '5\' 7"'$  (1.702 m)   Wt 165 lb 6.4 oz (75 kg)   SpO2 98%   BMI 25.91 kg/m?   ?Scalp lesion is dime sized with one area of thickness.  Half is denuded and half has crusting thickness.  No pigment.   ? ?Informed consent obtained.  Xylocaine + epi anesthesia.  3 mm punch biopsy taken without difficulty.  Patient tolerated the procedure well. ? ?ASSESSMENT/PLAN:  ? ?Urge incontinence ?Try cutting back on flomax to one pill daily.  Depending on response, consider change from vesicare to myrbetric.   ?  ? ? ?Zenia Resides, MD ?[place]  ?

## 2022-04-14 NOTE — Patient Instructions (Signed)
Try the biopsy the same way you would treat any other cut.  Watch for any sign of infection. ?I will call early next week with the biopsy results.  If for some reason I don't reach you, I will send a letter. ?Starting today, take only one tamsulosen at night.   ?When I call with the biopsy results, tell me if taking one pill helped.  We may decide to change the expensive medicine to something else when I call.   ?

## 2022-04-14 NOTE — Assessment & Plan Note (Signed)
Try cutting back on flomax to one pill daily.  Depending on response, consider change from vesicare to myrbetric.   ?

## 2022-04-20 ENCOUNTER — Telehealth: Payer: Self-pay

## 2022-04-20 NOTE — Telephone Encounter (Signed)
Patient calls nurse line requesting to speak with Dr. Andria Frames.  ? ?Patient reports he has gone down to one Flomax a day and reports "this has worked good."  ? ?Patient reports he "thinks" Dr. Andria Frames was going to prescribe him something "different."  ? ?Will forward to [place] for advisement.  ?

## 2022-04-20 NOTE — Telephone Encounter (Signed)
Called and LM that I have tried to contact several times this afternoon.  I likely won't be able to return his call until I get back from vacation on May 22. ?

## 2022-04-20 NOTE — Telephone Encounter (Signed)
Tried to call back.  Went straight to [employer].  I will try again. ?

## 2022-05-03 ENCOUNTER — Telehealth: Payer: Self-pay | Admitting: Physical Medicine and Rehabilitation

## 2022-05-03 ENCOUNTER — Other Ambulatory Visit: Payer: Self-pay | Admitting: Family Medicine

## 2022-05-03 NOTE — Telephone Encounter (Signed)
Pt called for referral for back appt. Please call pt after 12 pm [phone].

## 2022-05-05 ENCOUNTER — Other Ambulatory Visit: Payer: Self-pay | Admitting: Family Medicine

## 2022-05-05 ENCOUNTER — Ambulatory Visit (INDEPENDENT_AMBULATORY_CARE_PROVIDER_SITE_OTHER): Payer: Medicare Other | Admitting: Student

## 2022-05-05 VITALS — BP 125/80 | HR 84 | Temp 98.3°F | Wt 164.0 lb

## 2022-05-05 DIAGNOSIS — C4441 Basal cell carcinoma of skin of scalp and neck: Secondary | ICD-10-CM

## 2022-05-05 DIAGNOSIS — N3941 Urge incontinence: Secondary | ICD-10-CM

## 2022-05-05 MED ORDER — MIRABEGRON ER 25 MG PO TB24: 25.0000 mg | ORAL_TABLET | Freq: Every day | ORAL | 3 refills | Status: DC

## 2022-05-05 NOTE — Patient Instructions (Signed)
It was great to see you today! Thank you for choosing [facility] for your primary care. Frederick Knight was seen for skin cancer removal.  Today we addressed: We removed your skin cancer via shaving it off and then using electricity to cauterize and scrape more out. Please follow the wound care instructions provided to you. Expect healing time to be about 1 month. Be careful about scratching or hitting the area as it may bleed further. Hold pressure to stop the bleeding and let us know if you run into any difficulties.   If you haven't already, sign up for My Chart to have easy access to your labs results, and communication with your primary care physician.  You should return to our clinic Return in about 1 month (around 06/05/2022) for skin cancer removal f/u.  Please arrive 15 minutes before your appointment to ensure smooth check in process.  We appreciate your efforts in making this happen.  Please call the clinic at [phone] if your symptoms worsen or you have any concerns.  Thank you for allowing me to participate in your care, Wells Guiles, DO 05/05/2022, 11:11 AM PGY-1, [place]

## 2022-05-06 ENCOUNTER — Encounter: Payer: Self-pay | Admitting: Physical Medicine and Rehabilitation

## 2022-05-06 ENCOUNTER — Ambulatory Visit: Payer: Medicare Other | Admitting: Physical Medicine and Rehabilitation

## 2022-05-06 DIAGNOSIS — M5459 Other low back pain: Secondary | ICD-10-CM

## 2022-05-06 DIAGNOSIS — M545 Low back pain, unspecified: Secondary | ICD-10-CM

## 2022-05-06 DIAGNOSIS — G8929 Other chronic pain: Secondary | ICD-10-CM | POA: Diagnosis not present

## 2022-05-06 DIAGNOSIS — M47816 Spondylosis without myelopathy or radiculopathy, lumbar region: Secondary | ICD-10-CM | POA: Diagnosis not present

## 2022-05-06 MED ORDER — MELOXICAM 15 MG PO TABS: 15.0000 mg | ORAL_TABLET | Freq: Every day | ORAL | 0 refills | Status: DC
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
## 2022-05-06 NOTE — Progress Notes (Unsigned)
Pt state lower back pain. Pt state sitting and laying down makes the pain worse. Pt state he takes over the counter pain meds and uses pain cream to help ease his pain.  Numeric Pain Rating Scale and Functional Assessment Average Pain 6 Pain Right Now 4 My pain is intermittent and pressure pain Pain is worse with: sitting, some activites, and laying down Pain improves with: heat/ice, medication, and injections   In the last MONTH (on 0-10 scale) has pain interfered with the following?  1. General activity like being  able to carry out your everyday physical activities such as walking, climbing stairs, carrying groceries, or moving a chair?  Rating(5)  2. Relation with others like being able to carry out your usual social activities and roles such as  activities at home, at work and in your community. Rating(6)  3. Enjoyment of life such that you have  been bothered by emotional problems such as feeling anxious, depressed or irritable?  Rating(7)

## 2022-05-07 NOTE — Progress Notes (Signed)
Pre-op Diagnosis: Nodular BCC (basal cell carcinoma) Post-op Diagnosis: Same Procedure: Electrodessication and Curettage Location: left scalp Performing Physician: Wells Guiles, DO Supervising Physician (if applicable): Talbert Cage, MD  Informed consent was obtained prior to the procedure. Time out performed. The area surrounding the skin lesion was prepared and cleaned with povidone-iodine swab stick and wiped clean with alcohol prep. The area was sufficiently anesthetized with 1% Lidocaine with epinephrine. A sterile disposable DermaBlade was used to remove the lesion. Curettage and desiccation (electrosurgery) was then performed in 3 planes of motion for a total of 3 cycles. Hemostasis was achieved prior to completion and the procedure was well tolerated without complications. The wound was left open to heal without sutures. Standard post-procedure care was explained and return precautions and wound care handout given.   Wells Guiles, DO 05/07/2022, 11:29 AM PGY-1, [place]

## 2022-05-17 ENCOUNTER — Encounter: Payer: Self-pay | Admitting: *Deleted

## 2022-06-02 ENCOUNTER — Telehealth: Payer: Self-pay | Admitting: Physical Medicine and Rehabilitation

## 2022-06-02 ENCOUNTER — Other Ambulatory Visit: Payer: Self-pay | Admitting: Physical Medicine and Rehabilitation

## 2022-06-02 NOTE — Telephone Encounter (Signed)
Pt called to cancel appt. Please call pt to confirm cancellation. Pt phone number is [phone].

## 2022-06-06 ENCOUNTER — Ambulatory Visit: Payer: Medicare Other | Admitting: Physical Medicine and Rehabilitation

## 2022-06-09 ENCOUNTER — Ambulatory Visit (INDEPENDENT_AMBULATORY_CARE_PROVIDER_SITE_OTHER): Payer: Medicare Other | Admitting: Family Medicine

## 2022-06-09 DIAGNOSIS — C4441 Basal cell carcinoma of skin of scalp and neck: Secondary | ICD-10-CM | POA: Diagnosis not present

## 2022-06-09 DIAGNOSIS — R300 Dysuria: Secondary | ICD-10-CM | POA: Diagnosis not present

## 2022-06-09 LAB — POCT URINALYSIS DIP (MANUAL ENTRY)
Bilirubin, UA: NEGATIVE
Glucose, UA: NEGATIVE mg/dL
Ketones, POC UA: NEGATIVE mg/dL
Leukocytes, UA: NEGATIVE
Nitrite, UA: NEGATIVE
Protein Ur, POC: NEGATIVE mg/dL
Spec Grav, UA: 1.015 (ref 1.010–1.025)
Urobilinogen, UA: 1 E.U./dL
pH, UA: 7 (ref 5.0–8.0)

## 2022-06-09 LAB — POCT UA - MICROSCOPIC ONLY: WBC, Ur, HPF, POC: NONE SEEN (ref 0–5)

## 2022-06-09 NOTE — Progress Notes (Signed)
    SUBJECTIVE:   CHIEF COMPLAINT / HPI:    Patient presents for follow up after electrosurgery performed a month ago. Prior pathology results notable for nodular basal cell carcinoma with dermal nodular aggregates of atypical basaloid cells with peripheral palisading. Lesion extends to the peripheral margin of the specimen. Denies any concerns today, he reports that he has been doing significantly better and has noticed this improvement. Denies any new growths or drainage.   Also reports dysuria that has been ongoing for about 1 week. Denies penile discharge, rash, increased frequency, fever and chills. Denies any significant changes to sexual history. History of BPH, takes flomax and mirabegron daily.   OBJECTIVE:   General: Patient well-appearing, in no acute distress. Resp: normal work of breathing Derm: mildly erythematous lesion along left occipital area that is circumscribed, no surrounding erythema or drainage noted (picture below)     ASSESSMENT/PLAN:   Basal cell carcinoma (BCC) of occipital region of scalp -s/p electrosurgery, doing quite well and healing appropriately -instructed to continue to monitor, no further intervention indicated at this time  -strict return precautions discussed   Dysuria -UA unremarkable, pending urine culture  -reassuringly no systemic symptoms -in the setting of BPH, continue flomax and mirabegron  -called patient to discuss results and convey that we are awaiting culture results, no answer so left HIPAA compliant voicemail  -instructed to follow up with PCP if becomes symptomatic further      Donney Dice, [place]

## 2022-06-09 NOTE — Patient Instructions (Signed)
It was great seeing you today!  It seems that your lesion looks very good and it has improved. If you notice any new growth, drainage or other changes from the area then please make another appointment to ensure this gets evaluated.   Due to your burning and pain with urination, we got a urine sample. I will let you know of any abnormal results.   Please follow up at your next scheduled appointment with your primary care provider, if anything arises between now and then, please don't hesitate to contact our office.   Thank you for allowing [place] to be a part of your medical care!  Thank you, Dr. Larae Grooms

## 2022-06-09 NOTE — Assessment & Plan Note (Signed)
-  UA unremarkable, pending urine culture  -reassuringly no systemic symptoms -in the setting of BPH, continue flomax and mirabegron  -called patient to discuss results and convey that we are awaiting culture results, no answer so left HIPAA compliant voicemail  -instructed to follow up with PCP if becomes symptomatic further

## 2022-06-09 NOTE — Assessment & Plan Note (Signed)
-  s/p electrosurgery, doing quite well and healing appropriately -instructed to continue to monitor, no further intervention indicated at this time  -strict return precautions discussed

## 2022-07-08 ENCOUNTER — Other Ambulatory Visit: Payer: Self-pay | Admitting: Family Medicine

## 2022-08-03 DIAGNOSIS — R350 Frequency of micturition: Secondary | ICD-10-CM | POA: Diagnosis not present

## 2022-08-03 DIAGNOSIS — R3915 Urgency of urination: Secondary | ICD-10-CM | POA: Diagnosis not present

## 2022-10-19 DIAGNOSIS — Z23 Encounter for immunization: Secondary | ICD-10-CM | POA: Diagnosis not present

## 2022-12-15 ENCOUNTER — Ambulatory Visit (INDEPENDENT_AMBULATORY_CARE_PROVIDER_SITE_OTHER): Payer: Medicare Other | Admitting: Student

## 2022-12-15 VITALS — BP 136/67 | HR 77 | Ht 67.0 in | Wt 161.0 lb

## 2022-12-15 DIAGNOSIS — J069 Acute upper respiratory infection, unspecified: Secondary | ICD-10-CM

## 2022-12-15 NOTE — Progress Notes (Signed)
  SUBJECTIVE:   CHIEF COMPLAINT / HPI:   Patient presenting for acute headaches, cough, congestion, has a history of right sided subacute subdural hematoma for which she was Trudee Kuster hole craniectomy in 2013. No COPD or smoking history. Pleuritic chest pain, gone on a couple of days but slowly improving. Sister was also sick and came to see family over the holidays, but did not have a cough at that time. No history of asthma or inhalers.  Patient denies fever or chills.  He is mainly concerned that he would get family members sick.  PERTINENT  PMH / PSH: DM2, HLD, h/o subdural  Past Medical History:  Diagnosis Date   Diabetes mellitus without complication ([place])    Hyperlipemia    Controlled well with medications    OBJECTIVE:  BP 136/67   Pulse 77   Ht '5\' 7"'$  (1.702 m)   Wt 161 lb (73 kg)   SpO2 99%   BMI 25.22 kg/m  Physical Exam Constitutional:      General: He is not in acute distress.    Appearance: He is well-developed. He is not ill-appearing.  HENT:     Head: Normocephalic and atraumatic.  Eyes:     General: No scleral icterus.    Pupils: Pupils are equal, round, and reactive to light.  Cardiovascular:     Rate and Rhythm: Normal rate and regular rhythm.  Pulmonary:     Effort: Pulmonary effort is normal.     Breath sounds: Normal breath sounds.  Abdominal:     General: Bowel sounds are normal. There is no distension.     Palpations: Abdomen is soft.  Musculoskeletal:        General: Normal range of motion.     Cervical back: Normal range of motion.  Lymphadenopathy:     Cervical: No cervical adenopathy.  Skin:    General: Skin is warm.  Neurological:     Mental Status: He is alert.  Psychiatric:        Mood and Affect: Mood normal.        Behavior: Behavior normal.      ASSESSMENT/PLAN:  Upper respiratory tract infection, unspecified type Assessment & Plan: Likely due to a viral or bacterial upper respiratory tract infection. Common cold is usually  self-limiting and starts within the first 48 hours with accompanying symptoms which usually resolve within 2 weeks. Acute cough could also be caused by an exacerbation of an upper airway cough syndrome secondary to rhinosinusitis, asthma, COPD, or pneumonia. Unlikely PNA with no focal diminishment on exam or systemic symptoms. No history of COPD or asthma. No signs of GERD (treat with PPI), or rhinosinusitis. Honey can provide symptomatic relief, can also try humidifier at home, Tylenol/Ibuprofen as needed. If symptoms remain in the next couple of weeks or worsen, patient was instructed to return. Tessalon pearls sent for therapeutic benefit. Keep on mask around family.        Return if symptoms worsen or fail to improve. Erskine Emery, MD 12/16/2022, 10:57 AM PGY-2, [place]

## 2022-12-15 NOTE — Patient Instructions (Addendum)
It was great to see you today! Thank you for choosing [facility] for your primary care. Frederick Knight was seen for follow up.  Today we addressed: Continue with Tessalon pearls for cough  You can use honey with warm water and lemon  Tylenol and Motrin is also very helpful for cough   If you haven't already, sign up for My Chart to have easy access to your labs results, and communication with your primary care physician.  I recommend that you always bring your medications to each appointment as this makes it easy to ensure you are on the correct medications and helps [place] not miss refills when you need them. Call the clinic at [phone] if your symptoms worsen or you have any concerns.  You should return to our clinic Return if symptoms worsen or fail to improve. Please arrive 15 minutes before your appointment to ensure smooth check in process.  We appreciate your efforts in making this happen.  Thank you for allowing me to participate in your care, Erskine Emery, MD 12/15/2022, 3:04 PM PGY-2, [place]

## 2022-12-16 ENCOUNTER — Encounter: Payer: Self-pay | Admitting: Student

## 2022-12-16 NOTE — Assessment & Plan Note (Signed)
Likely due to a viral or bacterial upper respiratory tract infection. Common cold is usually self-limiting and starts within the first 48 hours with accompanying symptoms which usually resolve within 2 weeks. Acute cough could also be caused by an exacerbation of an upper airway cough syndrome secondary to rhinosinusitis, asthma, COPD, or pneumonia. Unlikely PNA with no focal diminishment on exam or systemic symptoms. No history of COPD or asthma. No signs of GERD (treat with PPI), or rhinosinusitis. Honey can provide symptomatic relief, can also try humidifier at home, Tylenol/Ibuprofen as needed. If symptoms remain in the next couple of weeks or worsen, patient was instructed to return. Tessalon pearls sent for therapeutic benefit. Keep on mask around family.

## 2023-02-03 ENCOUNTER — Other Ambulatory Visit: Payer: Self-pay | Admitting: Physical Medicine and Rehabilitation

## 2023-02-03 ENCOUNTER — Encounter: Payer: Self-pay | Admitting: Gastroenterology

## 2023-02-17 ENCOUNTER — Other Ambulatory Visit: Payer: Self-pay | Admitting: Family Medicine

## 2023-03-02 ENCOUNTER — Encounter: Payer: Self-pay | Admitting: Gastroenterology

## 2023-03-03 ENCOUNTER — Other Ambulatory Visit: Payer: Self-pay | Admitting: Physical Medicine and Rehabilitation

## 2023-03-14 ENCOUNTER — Other Ambulatory Visit: Payer: Self-pay | Admitting: Family Medicine

## 2023-03-23 ENCOUNTER — Ambulatory Visit (AMBULATORY_SURGERY_CENTER): Payer: Medicare Other | Admitting: *Deleted

## 2023-03-23 VITALS — Ht 67.0 in | Wt 171.0 lb

## 2023-03-23 DIAGNOSIS — Z83719 Family history of colon polyps, unspecified: Secondary | ICD-10-CM

## 2023-03-23 DIAGNOSIS — Z86010 Personal history of colonic polyps: Secondary | ICD-10-CM

## 2023-03-23 MED ORDER — NA SULFATE-K SULFATE-MG SULF 17.5-3.13-1.6 GM/177ML PO SOLN: 1.0000 | Freq: Once | ORAL | 0 refills | Status: AC

## 2023-03-23 NOTE — Progress Notes (Signed)
Pt's pre-visit is done in person and all paperwork (prep instructions) given to patient. Pt's name and DOB verified at the beginning of the pre-visit. Pt denies any difficulty with ambulating.  No egg or soy allergy known to patient  No issues known to pt with past sedation with any surgeries or procedures Patient denies ever being intubated Pt has no issues moving head neck or swallowing No FH of Malignant Hyperthermia Pt is not on diet pills Pt is not on home 02  Pt is not on blood thinners  Pt denies issues with constipation  Pt is not on dialysis Pt denies any upcoming cardiac testing Pt encouraged to use to use Singlecare or Goodrx to reduce cost  Pre-visit completed and red dot placed by patient's name on their procedure day (on provider's schedule).  . Visit bin person Pt states weight is 171 lb Instructions reviewed with pt and pt states understanding. Instructed to review again prior to procedure. Pt states they will.  Instructions sent by mail with coupon and by my chart

## 2023-03-24 ENCOUNTER — Encounter: Payer: Self-pay | Admitting: Gastroenterology

## 2023-03-27 ENCOUNTER — Emergency Department (HOSPITAL_COMMUNITY): Payer: Medicare Other

## 2023-03-27 ENCOUNTER — Encounter (HOSPITAL_COMMUNITY): Payer: Self-pay

## 2023-03-27 ENCOUNTER — Inpatient Hospital Stay (HOSPITAL_COMMUNITY)
Admission: EM | Admit: 2023-03-27 | Discharge: 2023-03-30 | DRG: 322 | Disposition: A | Discharge status: Home / Self Care | Payer: Medicare Other | Attending: Family Medicine | Admitting: Family Medicine

## 2023-03-27 ENCOUNTER — Other Ambulatory Visit: Payer: Self-pay

## 2023-03-27 DIAGNOSIS — R946 Abnormal results of thyroid function studies: Secondary | ICD-10-CM | POA: Diagnosis not present

## 2023-03-27 DIAGNOSIS — I2109 ST elevation (STEMI) myocardial infarction involving other coronary artery of anterior wall: Secondary | ICD-10-CM | POA: Diagnosis not present

## 2023-03-27 DIAGNOSIS — Z79899 Other long term (current) drug therapy: Secondary | ICD-10-CM | POA: Diagnosis not present

## 2023-03-27 DIAGNOSIS — D696 Thrombocytopenia, unspecified: Secondary | ICD-10-CM | POA: Diagnosis not present

## 2023-03-27 DIAGNOSIS — R7309 Other abnormal glucose: Secondary | ICD-10-CM

## 2023-03-27 DIAGNOSIS — I700 Atherosclerosis of aorta: Secondary | ICD-10-CM | POA: Diagnosis present

## 2023-03-27 DIAGNOSIS — K219 Gastro-esophageal reflux disease without esophagitis: Secondary | ICD-10-CM | POA: Diagnosis present

## 2023-03-27 DIAGNOSIS — E119 Type 2 diabetes mellitus without complications: Secondary | ICD-10-CM

## 2023-03-27 DIAGNOSIS — I255 Ischemic cardiomyopathy: Secondary | ICD-10-CM | POA: Insufficient documentation

## 2023-03-27 DIAGNOSIS — Z8249 Family history of ischemic heart disease and other diseases of the circulatory system: Secondary | ICD-10-CM

## 2023-03-27 DIAGNOSIS — R079 Chest pain, unspecified: Secondary | ICD-10-CM | POA: Diagnosis not present

## 2023-03-27 DIAGNOSIS — I10 Essential (primary) hypertension: Secondary | ICD-10-CM | POA: Diagnosis not present

## 2023-03-27 DIAGNOSIS — Z7984 Long term (current) use of oral hypoglycemic drugs: Secondary | ICD-10-CM | POA: Diagnosis not present

## 2023-03-27 DIAGNOSIS — I2510 Atherosclerotic heart disease of native coronary artery without angina pectoris: Secondary | ICD-10-CM | POA: Diagnosis present

## 2023-03-27 DIAGNOSIS — R7401 Elevation of levels of liver transaminase levels: Secondary | ICD-10-CM | POA: Diagnosis not present

## 2023-03-27 DIAGNOSIS — I214 Non-ST elevation (NSTEMI) myocardial infarction: Secondary | ICD-10-CM | POA: Insufficient documentation

## 2023-03-27 DIAGNOSIS — E785 Hyperlipidemia, unspecified: Secondary | ICD-10-CM | POA: Diagnosis present

## 2023-03-27 DIAGNOSIS — N400 Enlarged prostate without lower urinary tract symptoms: Secondary | ICD-10-CM | POA: Diagnosis present

## 2023-03-27 DIAGNOSIS — Z823 Family history of stroke: Secondary | ICD-10-CM

## 2023-03-27 DIAGNOSIS — Z955 Presence of coronary angioplasty implant and graft: Secondary | ICD-10-CM

## 2023-03-27 LAB — CBC WITH DIFFERENTIAL/PLATELET
Abs Immature Granulocytes: 0.03 10*3/uL (ref 0.00–0.07)
Basophils Absolute: 0 10*3/uL (ref 0.0–0.1)
Basophils Relative: 0 %
Eosinophils Absolute: 0.5 10*3/uL (ref 0.0–0.5)
Eosinophils Relative: 5 %
HCT: 46.7 % (ref 39.0–52.0)
Hemoglobin: 16.3 g/dL (ref 13.0–17.0)
Immature Granulocytes: 0 %
Lymphocytes Relative: 21 %
Lymphs Abs: 2.1 10*3/uL (ref 0.7–4.0)
MCH: 30.8 pg (ref 26.0–34.0)
MCHC: 34.9 g/dL (ref 30.0–36.0)
MCV: 88.3 fL (ref 80.0–100.0)
Monocytes Absolute: 0.9 10*3/uL (ref 0.1–1.0)
Monocytes Relative: 9 %
Neutro Abs: 6.2 10*3/uL (ref 1.7–7.7)
Neutrophils Relative %: 65 %
Platelets: 136 10*3/uL — ABNORMAL LOW (ref 150–400)
RBC: 5.29 MIL/uL (ref 4.22–5.81)
RDW: 12.8 % (ref 11.5–15.5)
WBC: 9.8 10*3/uL (ref 4.0–10.5)
nRBC: 0 % (ref 0.0–0.2)

## 2023-03-27 LAB — COMPREHENSIVE METABOLIC PANEL
ALT: 39 U/L (ref 0–44)
AST: 63 U/L — ABNORMAL HIGH (ref 15–41)
Albumin: 4.1 g/dL (ref 3.5–5.0)
Alkaline Phosphatase: 61 U/L (ref 38–126)
Anion gap: 12 (ref 5–15)
BUN: 12 mg/dL (ref 8–23)
CO2: 24 mmol/L (ref 22–32)
Calcium: 9.7 mg/dL (ref 8.9–10.3)
Chloride: 99 mmol/L (ref 98–111)
Creatinine, Ser: 1.09 mg/dL (ref 0.61–1.24)
GFR, Estimated: >60 mL/min (ref >60)
Glucose, Bld: 139 mg/dL — ABNORMAL HIGH (ref 70–99)
Potassium: 3.8 mmol/L (ref 3.5–5.1)
Sodium: 135 mmol/L (ref 135–145)
Total Bilirubin: 0.6 mg/dL (ref 0.3–1.2)
Total Protein: 7.3 g/dL (ref 6.5–8.1)

## 2023-03-27 LAB — TROPONIN I (HIGH SENSITIVITY): Troponin I (High Sensitivity): 2304 ng/L (ref <18)

## 2023-03-27 MED ORDER — FAMOTIDINE 20 MG PO TABS
10.0000 mg | ORAL_TABLET | Freq: Once | ORAL | Status: AC
  Administered 2023-03-27: 10 mg via ORAL
  Filled 2023-03-27: qty 1

## 2023-03-27 NOTE — ED Triage Notes (Signed)
Pt began having left sided chest pressure that radiates to left arm at 1600. Pain also radiates to back of left shoulder. Denies SOB. Pain is constant and does not get better. Pt took 325mg  of asa before coming and also an antacid. Pain continues. Pt denies cardiac history

## 2023-03-27 NOTE — ED Provider Triage Note (Signed)
Emergency Medicine Provider Triage Evaluation Note  Frederick Knight , a 72 y.o. male  was evaluated in triage.  Pt complains of chest pain started around 5 PM today, states happened after ED, pain remains in his chest, did not felt rating to his left arm and right arm, states the right arm is feeling better but still some slight left arm pain, not going to his back, no cardiac history no history PEs or DVTs currently on hormone therapy, no recent surgeries no long immobilizations.  Never had this in the past.  He is being treated for diabetes, hyperlipidemia, no hypertension.  Review of Systems  Positive: Chest pain, arm pain Negative: Nausea vomiting  Physical Exam  BP (!) 162/91 (BP Location: Right Arm)   Pulse 73   Temp 97.9 F (36.6 C)   Resp 16   Ht 5\' 7"  (1.702 m)   Wt 74.8 kg   SpO2 99%   BMI 25.84 kg/m  Gen:   Awake, no distress   Resp:  Normal effort  MSK:   Moves extremities without difficulty  Other:    Medical Decision Making  Medically screening exam initiated at 11:00 PM.  Appropriate orders placed.  Frederick Knight was informed that the remainder of the evaluation will be completed by another provider, this initial triage assessment does not replace that evaluation, and the importance of remaining in the ED until their evaluation is complete.  Lab work imaging ordered will need further workup.   Carroll Sage, PA-C 03/27/23 2301

## 2023-03-27 NOTE — ED Notes (Signed)
Crit trop 2304 told to MD pollina and pa faulkner

## 2023-03-28 ENCOUNTER — Inpatient Hospital Stay (HOSPITAL_COMMUNITY): Payer: Medicare Other

## 2023-03-28 ENCOUNTER — Other Ambulatory Visit (HOSPITAL_COMMUNITY): Payer: Self-pay

## 2023-03-28 ENCOUNTER — Other Ambulatory Visit: Payer: Self-pay | Admitting: Physical Medicine and Rehabilitation

## 2023-03-28 ENCOUNTER — Encounter (HOSPITAL_COMMUNITY): Payer: Self-pay | Admitting: Cardiovascular Disease

## 2023-03-28 ENCOUNTER — Encounter (HOSPITAL_COMMUNITY)
Admission: EM | Disposition: A | Discharge status: Home / Self Care | Payer: Self-pay | Source: Home / Self Care | Attending: Family Medicine

## 2023-03-28 DIAGNOSIS — I2510 Atherosclerotic heart disease of native coronary artery without angina pectoris: Secondary | ICD-10-CM | POA: Diagnosis present

## 2023-03-28 DIAGNOSIS — R079 Chest pain, unspecified: Secondary | ICD-10-CM | POA: Diagnosis present

## 2023-03-28 DIAGNOSIS — I2109 ST elevation (STEMI) myocardial infarction involving other coronary artery of anterior wall: Secondary | ICD-10-CM | POA: Diagnosis present

## 2023-03-28 DIAGNOSIS — E119 Type 2 diabetes mellitus without complications: Secondary | ICD-10-CM

## 2023-03-28 DIAGNOSIS — Z7984 Long term (current) use of oral hypoglycemic drugs: Secondary | ICD-10-CM | POA: Diagnosis not present

## 2023-03-28 DIAGNOSIS — I214 Non-ST elevation (NSTEMI) myocardial infarction: Secondary | ICD-10-CM

## 2023-03-28 DIAGNOSIS — I10 Essential (primary) hypertension: Secondary | ICD-10-CM | POA: Diagnosis present

## 2023-03-28 DIAGNOSIS — E785 Hyperlipidemia, unspecified: Secondary | ICD-10-CM | POA: Diagnosis present

## 2023-03-28 DIAGNOSIS — R946 Abnormal results of thyroid function studies: Secondary | ICD-10-CM | POA: Diagnosis present

## 2023-03-28 DIAGNOSIS — N400 Enlarged prostate without lower urinary tract symptoms: Secondary | ICD-10-CM | POA: Diagnosis present

## 2023-03-28 DIAGNOSIS — Z79899 Other long term (current) drug therapy: Secondary | ICD-10-CM | POA: Diagnosis not present

## 2023-03-28 DIAGNOSIS — I255 Ischemic cardiomyopathy: Secondary | ICD-10-CM | POA: Diagnosis present

## 2023-03-28 DIAGNOSIS — K219 Gastro-esophageal reflux disease without esophagitis: Secondary | ICD-10-CM | POA: Diagnosis present

## 2023-03-28 DIAGNOSIS — D696 Thrombocytopenia, unspecified: Secondary | ICD-10-CM | POA: Diagnosis present

## 2023-03-28 DIAGNOSIS — I700 Atherosclerosis of aorta: Secondary | ICD-10-CM | POA: Diagnosis present

## 2023-03-28 DIAGNOSIS — Z8249 Family history of ischemic heart disease and other diseases of the circulatory system: Secondary | ICD-10-CM | POA: Diagnosis not present

## 2023-03-28 DIAGNOSIS — Z823 Family history of stroke: Secondary | ICD-10-CM | POA: Diagnosis not present

## 2023-03-28 HISTORY — PX: CORONARY STENT INTERVENTION: CATH118234

## 2023-03-28 HISTORY — PX: LEFT HEART CATH AND CORONARY ANGIOGRAPHY: CATH118249

## 2023-03-28 LAB — CBC
HCT: 46.9 % (ref 39.0–52.0)
Hemoglobin: 16.4 g/dL (ref 13.0–17.0)
MCH: 31.2 pg (ref 26.0–34.0)
MCHC: 35 g/dL (ref 30.0–36.0)
MCV: 89.3 fL (ref 80.0–100.0)
Platelets: 131 10*3/uL — ABNORMAL LOW (ref 150–400)
RBC: 5.25 MIL/uL (ref 4.22–5.81)
RDW: 12.8 % (ref 11.5–15.5)
WBC: 12.5 10*3/uL — ABNORMAL HIGH (ref 4.0–10.5)
nRBC: 0 % (ref 0.0–0.2)

## 2023-03-28 LAB — GLUCOSE, CAPILLARY
Glucose-Capillary: 142 mg/dL — ABNORMAL HIGH (ref 70–99)
Glucose-Capillary: 139 mg/dL — ABNORMAL HIGH (ref 70–99)
Glucose-Capillary: 118 mg/dL — ABNORMAL HIGH (ref 70–99)
Glucose-Capillary: 135 mg/dL — ABNORMAL HIGH (ref 70–99)

## 2023-03-28 LAB — COMPREHENSIVE METABOLIC PANEL
ALT: 46 U/L — ABNORMAL HIGH (ref 0–44)
AST: 119 U/L — ABNORMAL HIGH (ref 15–41)
Albumin: 3.9 g/dL (ref 3.5–5.0)
Alkaline Phosphatase: 58 U/L (ref 38–126)
Anion gap: 13 (ref 5–15)
BUN: 9 mg/dL (ref 8–23)
CO2: 20 mmol/L — ABNORMAL LOW (ref 22–32)
Calcium: 9.2 mg/dL (ref 8.9–10.3)
Chloride: 100 mmol/L (ref 98–111)
Creatinine, Ser: 1.05 mg/dL (ref 0.61–1.24)
GFR, Estimated: >60 mL/min (ref >60)
Glucose, Bld: 139 mg/dL — ABNORMAL HIGH (ref 70–99)
Potassium: 3.8 mmol/L (ref 3.5–5.1)
Sodium: 133 mmol/L — ABNORMAL LOW (ref 135–145)
Total Bilirubin: 0.8 mg/dL (ref 0.3–1.2)
Total Protein: 6.9 g/dL (ref 6.5–8.1)

## 2023-03-28 LAB — HEPARIN LEVEL (UNFRACTIONATED)
Heparin Unfractionated: 0.44 IU/mL (ref 0.30–0.70)
Heparin Unfractionated: <0.1 IU/mL — ABNORMAL LOW (ref 0.30–0.70)

## 2023-03-28 LAB — ECHOCARDIOGRAM COMPLETE
Area-P 1/2: 3.83 cm2
Calc EF: 41.1 %
Height: 67 in
S' Lateral: 2.7 cm
Single Plane A2C EF: 46.8 %
Single Plane A4C EF: 37.9 %
Weight: 2640 oz

## 2023-03-28 LAB — TSH: TSH: 6.798 u[IU]/mL — ABNORMAL HIGH (ref 0.350–4.500)

## 2023-03-28 LAB — TROPONIN I (HIGH SENSITIVITY)
Troponin I (High Sensitivity): 5049 ng/L (ref <18)
Troponin I (High Sensitivity): 17821 ng/L (ref <18)
Troponin I (High Sensitivity): 18512 ng/L (ref <18)

## 2023-03-28 LAB — MAGNESIUM: Magnesium: 2.1 mg/dL (ref 1.7–2.4)

## 2023-03-28 LAB — HEMOGLOBIN A1C
Hgb A1c MFr Bld: 6.1 % — ABNORMAL HIGH (ref 4.8–5.6)
Mean Plasma Glucose: 128.37 mg/dL

## 2023-03-28 LAB — POCT ACTIVATED CLOTTING TIME
Activated Clotting Time: 353 seconds
Activated Clotting Time: 406 seconds

## 2023-03-28 LAB — CBG MONITORING, ED: Glucose-Capillary: 125 mg/dL — ABNORMAL HIGH (ref 70–99)

## 2023-03-28 LAB — T4, FREE: Free T4: 1.11 ng/dL (ref 0.61–1.12)

## 2023-03-28 SURGERY — LEFT HEART CATH AND CORONARY ANGIOGRAPHY
Anesthesia: LOCAL

## 2023-03-28 MED ORDER — HEPARIN BOLUS VIA INFUSION
3000.0000 [IU] | Freq: Once | INTRAVENOUS | Status: AC
  Administered 2023-03-28: 3000 [IU] via INTRAVENOUS
  Filled 2023-03-28: qty 3000

## 2023-03-28 MED ORDER — SODIUM CHLORIDE 0.9 % WEIGHT BASED INFUSION: 1.0000 mL/kg/h | INTRAVENOUS | Status: DC

## 2023-03-28 MED ORDER — VERAPAMIL HCL 2.5 MG/ML IV SOLN
INTRAVENOUS | Status: AC
  Filled 2023-03-28: qty 2

## 2023-03-28 MED ORDER — HEPARIN SODIUM (PORCINE) 1000 UNIT/ML IJ SOLN
INTRAMUSCULAR | Status: AC
  Filled 2023-03-28: qty 10

## 2023-03-28 MED ORDER — NITROGLYCERIN IN D5W 200-5 MCG/ML-% IV SOLN
0.0000 ug/min | INTRAVENOUS | Status: DC
  Administered 2023-03-28 (×2): 5 ug/min via INTRAVENOUS
  Filled 2023-03-28 (×2): qty 250

## 2023-03-28 MED ORDER — IOHEXOL 350 MG/ML SOLN
INTRAVENOUS | Status: DC | PRN
  Administered 2023-03-28: 230 mL

## 2023-03-28 MED ORDER — SODIUM CHLORIDE 0.9 % WEIGHT BASED INFUSION
1.0000 mL/kg/h | INTRAVENOUS | Status: DC
  Administered 2023-03-29: 1 mL/kg/h via INTRAVENOUS

## 2023-03-28 MED ORDER — ATORVASTATIN CALCIUM 80 MG PO TABS
80.0000 mg | ORAL_TABLET | Freq: Every day | ORAL | Status: DC
  Administered 2023-03-29 – 2023-03-30 (×2): 80 mg via ORAL
  Filled 2023-03-28 (×2): qty 1

## 2023-03-28 MED ORDER — SODIUM CHLORIDE 0.9% FLUSH: 3.0000 mL | INTRAVENOUS | Status: DC | PRN

## 2023-03-28 MED ORDER — HEPARIN (PORCINE) IN NACL 1000-0.9 UT/500ML-% IV SOLN
INTRAVENOUS | Status: DC | PRN
  Administered 2023-03-28 (×3): 500 mL

## 2023-03-28 MED ORDER — CHLORHEXIDINE GLUCONATE CLOTH 2 % EX PADS
6.0000 | MEDICATED_PAD | Freq: Every day | CUTANEOUS | Status: DC
  Administered 2023-03-29 – 2023-03-30 (×2): 6 via TOPICAL

## 2023-03-28 MED ORDER — LIDOCAINE HCL (PF) 1 % IJ SOLN
INTRAMUSCULAR | Status: DC | PRN
  Administered 2023-03-28: 2 mL

## 2023-03-28 MED ORDER — SODIUM CHLORIDE 0.9 % WEIGHT BASED INFUSION
3.0000 mL/kg/h | INTRAVENOUS | Status: DC
  Administered 2023-03-29: 3 mL/kg/h via INTRAVENOUS

## 2023-03-28 MED ORDER — SODIUM CHLORIDE 0.9 % IV SOLN: 250.0000 mL | INTRAVENOUS | Status: DC | PRN

## 2023-03-28 MED ORDER — VERAPAMIL HCL 2.5 MG/ML IV SOLN
INTRAVENOUS | Status: DC | PRN
  Administered 2023-03-28: 10 mL via INTRA_ARTERIAL

## 2023-03-28 MED ORDER — SODIUM CHLORIDE 0.9 % IV SOLN
250.0000 mL | INTRAVENOUS | Status: DC | PRN
  Administered 2023-03-29: 250 mL via INTRAVENOUS

## 2023-03-28 MED ORDER — SODIUM CHLORIDE 0.9 % IV SOLN: INTRAVENOUS | Status: DC

## 2023-03-28 MED ORDER — TICAGRELOR 90 MG PO TABS
90.0000 mg | ORAL_TABLET | Freq: Two times a day (BID) | ORAL | Status: DC
  Administered 2023-03-28 – 2023-03-29 (×3): 90 mg via ORAL
  Filled 2023-03-28 (×3): qty 1

## 2023-03-28 MED ORDER — ASPIRIN 81 MG PO CHEW
81.0000 mg | CHEWABLE_TABLET | ORAL | Status: AC
  Filled 2023-03-28: qty 1

## 2023-03-28 MED ORDER — FENTANYL CITRATE (PF) 100 MCG/2ML IJ SOLN
INTRAMUSCULAR | Status: DC | PRN
  Administered 2023-03-28: 25 ug via INTRAVENOUS

## 2023-03-28 MED ORDER — ASPIRIN 81 MG PO CHEW
81.0000 mg | CHEWABLE_TABLET | Freq: Every day | ORAL | Status: DC
  Administered 2023-03-29 – 2023-03-30 (×2): 81 mg via ORAL
  Filled 2023-03-28 (×2): qty 1

## 2023-03-28 MED ORDER — MIDAZOLAM HCL 2 MG/2ML IJ SOLN
INTRAMUSCULAR | Status: AC
  Filled 2023-03-28: qty 2

## 2023-03-28 MED ORDER — DIAZEPAM 5 MG PO TABS: 5.0000 mg | ORAL_TABLET | Freq: Four times a day (QID) | ORAL | Status: DC | PRN

## 2023-03-28 MED ORDER — ONDANSETRON HCL 4 MG/2ML IJ SOLN: 4.0000 mg | Freq: Four times a day (QID) | INTRAMUSCULAR | Status: DC | PRN

## 2023-03-28 MED ORDER — TICAGRELOR 90 MG PO TABS
ORAL_TABLET | ORAL | Status: AC
  Filled 2023-03-28: qty 2

## 2023-03-28 MED ORDER — LIDOCAINE HCL (PF) 1 % IJ SOLN
INTRAMUSCULAR | Status: AC
  Filled 2023-03-28: qty 30

## 2023-03-28 MED ORDER — NITROGLYCERIN 1 MG/10 ML FOR IR/CATH LAB
INTRA_ARTERIAL | Status: AC
  Filled 2023-03-28: qty 10

## 2023-03-28 MED ORDER — HEPARIN SODIUM (PORCINE) 1000 UNIT/ML IJ SOLN
INTRAMUSCULAR | Status: DC | PRN
  Administered 2023-03-28 (×2): 4000 [IU] via INTRAVENOUS

## 2023-03-28 MED ORDER — SODIUM CHLORIDE 0.9 % WEIGHT BASED INFUSION: 3.0000 mL/kg/h | INTRAVENOUS | Status: DC

## 2023-03-28 MED ORDER — ACETAMINOPHEN 325 MG PO TABS
650.0000 mg | ORAL_TABLET | ORAL | Status: DC | PRN
  Administered 2023-03-28 – 2023-03-30 (×6): 650 mg via ORAL
  Filled 2023-03-28 (×7): qty 2

## 2023-03-28 MED ORDER — ORAL CARE MOUTH RINSE: 15.0000 mL | OROMUCOSAL | Status: DC | PRN

## 2023-03-28 MED ORDER — HEPARIN (PORCINE) 25000 UT/250ML-% IV SOLN
1100.0000 [IU]/h | INTRAVENOUS | Status: DC
  Administered 2023-03-28: 900 [IU]/h via INTRAVENOUS
  Filled 2023-03-28: qty 250

## 2023-03-28 MED ORDER — HEPARIN (PORCINE) 25000 UT/250ML-% IV SOLN
900.0000 [IU]/h | INTRAVENOUS | Status: DC
  Administered 2023-03-28: 900 [IU]/h via INTRAVENOUS
  Filled 2023-03-28: qty 250

## 2023-03-28 MED ORDER — SODIUM CHLORIDE 0.9% FLUSH
3.0000 mL | Freq: Two times a day (BID) | INTRAVENOUS | Status: DC
  Administered 2023-03-28 – 2023-03-30 (×4): 3 mL via INTRAVENOUS

## 2023-03-28 MED ORDER — ASPIRIN 81 MG PO CHEW
81.0000 mg | CHEWABLE_TABLET | ORAL | Status: DC
Start: 2023-03-29 — End: 2023-03-28

## 2023-03-28 MED ORDER — TICAGRELOR 90 MG PO TABS
ORAL_TABLET | ORAL | Status: DC | PRN
  Administered 2023-03-28: 180 mg via ORAL

## 2023-03-28 MED ORDER — METOPROLOL TARTRATE 12.5 MG HALF TABLET
12.5000 mg | ORAL_TABLET | Freq: Two times a day (BID) | ORAL | Status: AC
  Administered 2023-03-28 – 2023-03-29 (×2): 12.5 mg via ORAL
  Filled 2023-03-28 (×2): qty 1

## 2023-03-28 MED ORDER — NITROGLYCERIN 0.4 MG SL SUBL
0.4000 mg | SUBLINGUAL_TABLET | SUBLINGUAL | Status: AC | PRN
  Administered 2023-03-28 (×3): 0.4 mg via SUBLINGUAL

## 2023-03-28 MED ORDER — NITROGLYCERIN 1 MG/10 ML FOR IR/CATH LAB
INTRA_ARTERIAL | Status: DC | PRN
  Administered 2023-03-28 (×3): 200 ug

## 2023-03-28 MED ORDER — ASPIRIN 81 MG PO TBEC: 81.0000 mg | DELAYED_RELEASE_TABLET | Freq: Every day | ORAL | Status: DC

## 2023-03-28 MED ORDER — PERFLUTREN LIPID MICROSPHERE
1.0000 mL | INTRAVENOUS | Status: AC | PRN
  Administered 2023-03-28: 6 mL via INTRAVENOUS

## 2023-03-28 MED ORDER — MIDAZOLAM HCL 2 MG/2ML IJ SOLN
INTRAMUSCULAR | Status: DC | PRN
  Administered 2023-03-28: 2 mg via INTRAVENOUS

## 2023-03-28 MED ORDER — LABETALOL HCL 5 MG/ML IV SOLN: 10.0000 mg | INTRAVENOUS | Status: AC | PRN

## 2023-03-28 MED ORDER — MORPHINE SULFATE (PF) 4 MG/ML IV SOLN
4.0000 mg | Freq: Once | INTRAVENOUS | Status: AC
  Administered 2023-03-28: 4 mg via INTRAVENOUS
  Filled 2023-03-28: qty 1

## 2023-03-28 MED ORDER — FENTANYL CITRATE (PF) 100 MCG/2ML IJ SOLN
INTRAMUSCULAR | Status: AC
  Filled 2023-03-28: qty 2

## 2023-03-28 MED ORDER — HYDRALAZINE HCL 20 MG/ML IJ SOLN: 10.0000 mg | INTRAMUSCULAR | Status: AC | PRN

## 2023-03-28 MED ORDER — ENOXAPARIN SODIUM 40 MG/0.4ML IJ SOSY
40.0000 mg | PREFILLED_SYRINGE | INTRAMUSCULAR | Status: DC
Start: 2023-03-28 — End: 2023-03-28

## 2023-03-28 MED ORDER — ASPIRIN 81 MG PO CHEW: 81.0000 mg | CHEWABLE_TABLET | ORAL | Status: DC

## 2023-03-28 MED ORDER — INSULIN ASPART 100 UNIT/ML IJ SOLN: 0.0000 [IU] | Freq: Three times a day (TID) | INTRAMUSCULAR | Status: DC

## 2023-03-28 SURGICAL SUPPLY — 19 items
BALL SAPPHIRE NC24 3.25X12 (BALLOONS) ×1
BALLN EMERGE MR 2.0X12 (BALLOONS) ×1
BALLOON EMERGE MR 2.0X12 (BALLOONS) IMPLANT
BALLOON SAPPHIRE NC24 3.25X12 (BALLOONS) IMPLANT
CATH 5FR JL3.5 JR4 ANG PIG MP (CATHETERS) IMPLANT
CATH VISTA GUIDE 6FR XBLAD3.5 (CATHETERS) IMPLANT
DEVICE RAD TR BAND REGULAR (VASCULAR PRODUCTS) IMPLANT
ELECT DEFIB PAD ADLT CADENCE (PAD) IMPLANT
GLIDESHEATH SLEND SS 6F .021 (SHEATH) IMPLANT
GUIDEWIRE INQWIRE 1.5J.035X260 (WIRE) IMPLANT
INQWIRE 1.5J .035X260CM (WIRE) ×1
KIT ENCORE 26 ADVANTAGE (KITS) IMPLANT
KIT HEART LEFT (KITS) ×1 IMPLANT
PACK CARDIAC CATHETERIZATION (CUSTOM PROCEDURE TRAY) ×1 IMPLANT
SHEATH PROBE COVER 6X72 (BAG) IMPLANT
STENT ONYX FRONTIER 3.0X15 (Permanent Stent) IMPLANT
TRANSDUCER W/STOPCOCK (MISCELLANEOUS) ×1 IMPLANT
TUBING CIL FLEX 10 FLL-RA (TUBING) ×1 IMPLANT
WIRE COUGAR XT STRL 190CM (WIRE) IMPLANT

## 2023-03-28 NOTE — TOC CM/SW Note (Signed)
.   Transition of Care [facility]) Screening Note   Patient Details  Name: Frederick Knight Date of Birth: May 16, 1951   Transition of Care [facility]) CM/SW Contact:    Elliot Cousin, RN Phone Number: [phone] 03/28/2023, 6:01 PM     Transition of Care Department [facility]) has reviewed patient. We will continue to monitor patient advancement through interdisciplinary progression rounds. Waiting PT/OT recommendations. May need HH.

## 2023-03-28 NOTE — Assessment & Plan Note (Signed)
Last A1c 6.0%.  Taking metformin 500 mg 24-hour tablet daily. -Hold home metformin -A1c -CBG at meals and bedtime -sSSI

## 2023-03-28 NOTE — Progress Notes (Signed)
FMTS Interim Progress Note  S: Resting comfortably. Answered questions related to plan of care.  O: BP 116/72   Pulse 83   Temp 97.7 F (36.5 C) (Oral)   Resp 17   Ht  (1.702 m)   Wt 74.8 kg   SpO2 98%   BMI 25.84 kg/m   General: NAD, pleasant, resting comfortably in bed Cardio: RRR, no MRG. Cap Refill <2s. Respiratory: CTAB, normal wob on RA GI: Abdomen is soft, not tender, not distended. BS present Skin: Warm and dry  A/P: NSTEMI Stable, and comfortable. -Pending LHC -Pending Echo -Continue medical management  Low TSH -Pending free T4  Elevated LFTs -Pending hepatitis panel  Tiffany Kocher, DO 03/28/2023, 7:15 AM PGY-1, [facility] pager [phone]

## 2023-03-28 NOTE — Progress Notes (Signed)
OT Cancellation Note  Patient Details Name: Frederick Knight MRN: [id number] DOB: 1951-09-08   Cancelled Treatment:    Reason Eval/Treat Not Completed: Patient at procedure or test/ unavailable (cath lab)  Mateo Flow 03/28/2023, 10:21 AM

## 2023-03-28 NOTE — Progress Notes (Signed)
ANTICOAGULATION CONSULT NOTE - Follow Up Consult  Pharmacy Consult for heparin Indication: chest pain/ACS  No Known Allergies  Patient Measurements: Height:  (170.2 cm) Weight: 74.8 kg (165 lb) IBW/kg (Calculated) : 66.1 Heparin Dosing Weight: 74.8  Vital Signs: Temp: 97.7 F (36.5 C) (04/16 0318) Temp Source: Oral (04/16 0318) BP: 126/73 (04/16 0900) Pulse Rate: 69 (04/16 0900)  Labs: Recent Labs    03/27/23 2308 03/28/23 0105 03/28/23 0419 03/28/23 0703 03/28/23 0825  HGB 16.3  --  16.4  --   --   HCT 46.7  --  46.9  --   --   PLT 136*  --  131*  --   --   HEPARINUNFRC  --   --   --   --  0.44  CREATININE 1.09  --  1.05  --   --   TROPONINIHS 2,304* 5,049*  --  82,956*  --     Estimated Creatinine Clearance: 58.6 mL/min (by C-G formula based on SCr of 1.05 mg/dL).   Assessment: Patient is a 57 YOM presenting with NSTEMI. Patient to receive cardiac catheterization and TTE. Pharmacy consulted to dose heparin for ACS.  Heparin level is therapeutic at 0.44. Platelets 131, stable; Hgb WNL.  Goal of Therapy:  Heparin level 0.3-0.7 units/ml Monitor platelets by anticoagulation protocol: Yes   Plan:  Continue heparin at 900 units/hr.  Recheck heparin levels in 8 hours. Daily heparin level, CBC, and monitor for bleeding.  Morrie Sheldon Luellen Howson 03/28/2023,9:43 AM

## 2023-03-28 NOTE — Progress Notes (Signed)
ANTICOAGULATION CONSULT NOTE - Initial Consult  Pharmacy Consult for heparin Indication: chest pain/ACS  No Known Allergies  Patient Measurements: Height:  (170.2 cm) Weight: 74.8 kg (165 lb) IBW/kg (Calculated) : 66.1  Vital Signs: Temp: 97.9 F (36.6 C) (04/15 2222) BP: 162/91 (04/15 2222) Pulse Rate: 73 (04/15 2222)  Labs: Recent Labs    03/27/23 2308  HGB 16.3  HCT 46.7  PLT 136*  CREATININE 1.09  TROPONINIHS 2,304*    Estimated Creatinine Clearance: 56.4 mL/min (by C-G formula based on SCr of 1.09 mg/dL).   Medical History: Past Medical History:  Diagnosis Date   Diabetes mellitus without complication    Hyperlipemia    Controlled well with medications    Assessment: 72yo male c/o chest pressure radiating to LUE/shoulder, initial troponin elevated >> to begin heparin.  Goal of Therapy:  Heparin level 0.3-0.7 units/ml Monitor platelets by anticoagulation protocol: Yes   Plan:  Heparin 3000 units IV bolus x1 followed by gtt at 900 units/hr. Monitor heparin levels and CBC.  Vernard Gambles, PharmD, BCPS  03/28/2023,12:45 AM

## 2023-03-28 NOTE — Assessment & Plan Note (Addendum)
Previously on rosuvastatin 10 mg daily. Last lipid panel in 2023 with LDL 84 - Start Atorvostatin 80 mg daily, with goal LDL <70 - Lipid panel

## 2023-03-28 NOTE — Progress Notes (Addendum)
ANTICOAGULATION CONSULT NOTE - Follow Up Consult  Pharmacy Consult for heparin Indication: chest pain/ACS  No Known Allergies  Patient Measurements: Height:  (170.2 cm) Weight: 74.8 kg (165 lb) IBW/kg (Calculated) : 66.1 Heparin Dosing Weight: 74.8  Vital Signs: Temp: 97.7 F (36.5 C) (04/16 0318) Temp Source: Oral (04/16 0318) BP: 102/69 (04/16 1200) Pulse Rate: 72 (04/16 1200)  Labs: Recent Labs    03/27/23 2308 03/28/23 0105 03/28/23 0419 03/28/23 0703 03/28/23 0825  HGB 16.3  --  16.4  --   --   HCT 46.7  --  46.9  --   --   PLT 136*  --  131*  --   --   HEPARINUNFRC  --   --   --   --  0.44  CREATININE 1.09  --  1.05  --   --   TROPONINIHS 2,304* 5,049*  --  16,109* 60,454*     Estimated Creatinine Clearance: 58.6 mL/min (by C-G formula based on SCr of 1.05 mg/dL).   Assessment: Patient is a 39 YOM presenting with NSTEMI. Patient to receive cardiac catheterization and TTE. Pharmacy consulted to dose heparin for ACS.  Cath 4/16 DES to totally occluded LAD - plan for staged PCI to distal RCA. Was previously therapeutic at 900 units/hr. Hgb 16.4, plt 131. No s/sx of bleeding.   Plan for heparin to restart 8 hours after sheath removed (documented on 4/16@1129 ).  Goal of Therapy:  Heparin level 0.3-0.7 units/ml Monitor platelets by anticoagulation protocol: Yes   Plan:  Restart heparin at 900 units/hr on 4/16@1930   Recheck heparin levels in 8 hours with AM labs Daily heparin level, CBC, and monitor for bleeding Follow up timing of staged PCI  Thank you for allowing pharmacy to participate in this patient's care,  Sherron Monday, PharmD, BCCCP Clinical Pharmacist  Phone: [phone] 03/28/2023 1:39 PM  Please check AMION for all [facility] Pharmacy phone numbers After 10:00 PM, call Main Pharmacy [phone]

## 2023-03-28 NOTE — Interval H&P Note (Signed)
Cath Lab Visit (complete for each Cath Lab visit)  Clinical Evaluation Leading to the Procedure:   ACS: Yes.    Non-ACS:    Anginal Classification: CCS IV  Anti-ischemic medical therapy: Minimal Therapy (1 class of medications)  Non-Invasive Test Results: No non-invasive testing performed  Prior CABG: No previous CABG      History and Physical Interval Note:  03/28/2023 10:11 AM  Frederick Knight  has presented today for surgery, with the diagnosis of nstemi.  The various methods of treatment have been discussed with the patient and family. After consideration of risks, benefits and other options for treatment, the patient has consented to  Procedure(s): LEFT HEART CATH AND CORONARY ANGIOGRAPHY (N/A) as a surgical intervention.  The patient's history has been reviewed, patient examined, no change in status, stable for surgery.  I have reviewed the patient's chart and labs.  Questions were answered to the patient's satisfaction.     Nicki Guadalajara

## 2023-03-28 NOTE — Progress Notes (Signed)
PT Cancellation Note  Patient Details Name: ENOCH MOFFA MRN: [id number] DOB: 12-17-1950   Cancelled Treatment:    Reason Eval/Treat Not Completed: Medical issues which prohibited therapy;Patient at procedure or test/unavailable. Pt off unit at cath lab. PT will follow up once pt is medically stable and appropriate to mobilize.   Arlyss Gandy 03/28/2023, 10:47 AM

## 2023-03-28 NOTE — Hospital Course (Addendum)
LEMARCUS BAGGERLY is a 72 y.o.male with a history of T2DM, GERD, BPH who was admitted to the [facility] Teaching Service at [facility] for chest pain. His hospital course is detailed below:  Chest pain 2/2 NSTEMI Presented with acute onset chest pain. Hemodynamically stable on admission. In ED, Labs significant for troponin 2,304 with rep  Mild thrombocytopenia 136,000.  Isolated elevation AST 63.    Other chronic conditions were medically managed with home medications and formulary alternatives as necessary (***)  PCP Follow-up Recommendations: DAPT with aspirin/Brilinta for minimum of 1 year.

## 2023-03-28 NOTE — Consult Note (Signed)
Cardiology Consult    Patient ID: Frederick Knight MRN: [id number], DOB/AGE: 73/03/1951   Admit date: 03/27/2023 Date of Consult: 03/28/2023 Requesting Provider: Dione Booze, MD  PCP:  Littie Deeds, MD   [facility] Providers Cardiologist:  None   Patient Profile    Frederick Knight is a 72 y.o. male with a history of type 2 DM, hyperlipidemia, and traumatic SDH in 2013. He is being seen tonight for the evaluation of chest pain.  History of Present Illness    Reports sudden onset chest pain (pressure, heaviness) around 5pm with radiation to left arm. It has been constant since then. Never experienced anything like this before. He has some associated dyspnea. Uncertain if exertional as he has been resting since it started. Took full-dose aspirin PTA. Continues to have chest pain on my evaluation, but has improved some with nitroglycerin and further with morphine.   Initial ECG shows low limb lead voltage and poor R wave progression but otherwise normal. Initial troponin 2304. Known to family medicine and is being admitted to their service. Current VS normal.    Past Medical History   Past Medical History:  Diagnosis Date   Diabetes mellitus without complication    Hyperlipemia    Controlled well with medications    Past Surgical History:  Procedure Laterality Date   COLONOSCOPY N/A 01/15/2015   RMR:  Multiple polyps removed as described above.   COLONOSCOPY  01/28/2021   Frederick Knight [facility]   COLONOSCOPY  2016   CRANIOTOMY  05/16/2012   Procedure: CRANIOTOMY HEMATOMA EVACUATION SUBDURAL;  Surgeon: Mariam Dollar, MD;  Location: [facility];  Service: Neurosurgery;  Laterality: N/A;  Right burr holes for evacuation of subdural hematoma   CRANIOTOMY  05/18/2012   Procedure: CRANIOTOMY HEMATOMA EVACUATION SUBDURAL;  Surgeon: Mariam Dollar, MD;  Location: [facility];  Service: Neurosurgery;  Laterality: Right;     No Known Allergies Inpatient Medications      Family History     Family History  Problem Relation Age of Onset   Stroke Mother    Colon cancer Neg Hx    Esophageal cancer Neg Hx    Rectal cancer Neg Hx    Stomach cancer Neg Hx    Colon polyps Neg Hx    He indicated that his mother is alive. He indicated that his father is deceased. He indicated that the status of his neg hx is unknown.   Social History    Social History   Socioeconomic History   Marital status: Divorced    Spouse name: Not on file   Number of children: Not on file   Years of education: Not on file   Highest education level: Not on file  Occupational History   Not on file  Tobacco Use   Smoking status: Never   Smokeless tobacco: Never  Vaping Use   Vaping Use: Never used  Substance and Sexual Activity   Alcohol use: No    Alcohol/week: 0.0 standard drinks of alcohol   Drug use: No   Sexual activity: Not on file  Other Topics Concern   Not on file  Social History Narrative   Not on file   Social Determinants of Health   Financial Resource Strain: Not on file  Food Insecurity: Not on file  Transportation Needs: Not on file  Physical Activity: Not on file  Stress: Not on file  Social Connections: Not on file  Intimate Partner Violence: Not on file  Review of Systems    A comprehensive ROS was obtained with pertinent positives and negatives noted in the HPI(  Physical Exam    Blood pressure (!) 144/73, pulse 72, temperature 97.9 F (36.6 C), resp. rate (!) 21, height  (1.702 m), weight 74.8 kg, SpO2 100 %.    No intake or output data in the 24 hours ending 03/28/23 0136 Wt Readings from Last 3 Encounters:  03/27/23 74.8 kg  03/23/23 77.6 kg  12/15/22 73 kg    CONSTITUTIONAL: alert and conversant, well-appearing, nourished, no distress HEENT: normal NECK: no JVD, no masses CARDIAC: Regular rhythm. Normal S1/S2, no S3/S4. No murmur. No friction rub. VASCULAR: Radial pulses intact bilaterally. No carotid bruits. PULMONARY/CHEST WALL: no  deformities, normal breath sounds bilaterally, normal work of breathing ABDOMINAL: soft, non-tender, non-distended EXTREMITIES: no edema, no muscle atrophy, warm and well-perfused SKIN: Dry and intact without apparent rashes or wounds. No peripheral cyanosis. NEUROLOGIC: alert, no abnormal movements, cranial nerves grossly intact. PSYCH: normal affect, normal speech and language   Labs    Recent Labs    03/27/23 2308  TROPONINIHS 2,304*   Lab Results  Component Value Date   WBC 9.8 03/27/2023   HGB 16.3 03/27/2023   HCT 46.7 03/27/2023   MCV 88.3 03/27/2023   PLT 136 (L) 03/27/2023    Recent Labs  Lab 03/27/23 2308  NA 135  K 3.8  CL 99  CO2 24  BUN 12  CREATININE 1.09  CALCIUM 9.7  PROT 7.3  BILITOT 0.6  ALKPHOS 61  ALT 39  AST 63*  GLUCOSE 139*   Lab Results  Component Value Date   CHOL 151 01/04/2022   HDL 44 01/04/2022   LDLCALC 84 01/04/2022   TRIG 129 01/04/2022     Radiology Studies    DG Chest 2 View  Result Date: 03/27/2023 CLINICAL DATA:  Chest pain. EXAM: CHEST - 2 VIEW COMPARISON:  05/25/2018 FINDINGS: Heart is normal in size. Stable mediastinal contours. Calcified mediastinal lymph nodes typical of prior granulomatous disease. Minor aortic atherosclerosis. The lungs are clear. Pulmonary vasculature is normal. No consolidation, pleural effusion, or pneumothorax. No acute osseous abnormalities are seen. IMPRESSION: 1. No acute chest findings. 2. Minor aortic atherosclerosis. Electronically Signed   By: Narda Rutherford M.D.   On: 03/27/2023 23:20    ECG & Cardiac Imaging    ECG: sinus rhythm, low limb lead voltage, PRWP, otherwise normal - personally reviewed.  Assessment & Plan    NSTEMI: typical symptoms with significant troponin elevation. ECG with no significant changes. Still having some mild chest pain, but otherwise stable.  - Continue daily aspirin, received full dose - Agree with heparin - Nitroglycerin infusion for chest pain - High  intensity statin - Start low-dose metoprolol - Serial troponins, TTE in AM, updated lipids/A1c - Keep NPO for left heart cath/angiography   Signed, Clerance Lav, MD 03/28/2023, 1:36 AM  For questions or updates, please contact   Please consult [email or web address] for contact info under Cardiology/STEMI.

## 2023-03-28 NOTE — H&P (View-Only) (Signed)
 Rounding Note    Patient Name: Frederick Knight Date of Encounter: 03/28/2023  [facility] Cardiologist: None   Subjective   Still with 3/10 mild chest discomfort. Family at the bedside.   Inpatient Medications    Scheduled Meds:  aspirin EC  81 mg Oral Daily   atorvastatin  80 mg Oral Daily   insulin aspart  0-6 Units Subcutaneous TID WC   metoprolol tartrate  12.5 mg Oral BID   sodium chloride flush  3 mL Intravenous Q12H   Continuous Infusions:  heparin 900 Units/hr (03/28/23 0713)   nitroGLYCERIN 10 mcg/min (03/28/23 0714)   PRN Meds:    Vital Signs    Vitals:   03/28/23 0600 03/28/23 0605 03/28/23 0610 03/28/23 0700  BP: 104/72 119/69 122/78 116/72  Pulse: 90 79 71 83  Resp: (!) 27 (!) 21 14 17  Temp:      TempSrc:      SpO2: 100% 100% 100% 98%  Weight:      Height:       No intake or output data in the 24 hours ending 03/28/23 0824    03/27/2023   10:32 PM 03/23/2023    3:34 PM 12/15/2022    2:14 PM  Last 3 Weights  Weight (lbs) 165 lb 171 lb 161 lb  Weight (kg) 74.844 kg 77.565 kg 73.029 kg      Telemetry    Sinus Rhythm - Personally Reviewed  ECG    Sinus Rhythm, slight up-sloping of ST segment in inferolateral leads - Personally Reviewed  Physical Exam   GEN: No acute distress.   Neck: No JVD Cardiac: RRR, no murmurs, rubs, or gallops.  Respiratory: Clear to auscultation bilaterally. GI: Soft, nontender, non-distended  MS: No edema; No deformity. Neuro:  Nonfocal  Psych: Normal affect   Labs    High Sensitivity Troponin:   Recent Labs  Lab 03/27/23 2308 03/28/23 0105 03/28/23 0703  TROPONINIHS 2,304* 5,049* 17,821*     Chemistry Recent Labs  Lab 03/27/23 2308 03/28/23 0419  NA 135 133*  K 3.8 3.8  CL 99 100  CO2 24 20*  GLUCOSE 139* 139*  BUN 12 9  CREATININE 1.09 1.05  CALCIUM 9.7 9.2  PROT 7.3 6.9  ALBUMIN 4.1 3.9  AST 63* 119*  ALT 39 46*  ALKPHOS 61 58  BILITOT 0.6 0.8  GFRNONAA >60 >60   ANIONGAP 12 13    Lipids No results for input(s): "CHOL", "TRIG", "HDL", "LABVLDL", "LDLCALC", "CHOLHDL" in the last 168 hours.  Hematology Recent Labs  Lab 03/27/23 2308 03/28/23 0419  WBC 9.8 12.5*  RBC 5.29 5.25  HGB 16.3 16.4  HCT 46.7 46.9  MCV 88.3 89.3  MCH 30.8 31.2  MCHC 34.9 35.0  RDW 12.8 12.8  PLT 136* 131*   Thyroid  Recent Labs  Lab 03/28/23 0419  TSH 6.798*    BNPNo results for input(s): "BNP", "PROBNP" in the last 168 hours.  DDimer No results for input(s): "DDIMER" in the last 168 hours.   Radiology    DG Chest 2 View  Result Date: 03/27/2023 CLINICAL DATA:  Chest pain. EXAM: CHEST - 2 VIEW COMPARISON:  05/25/2018 FINDINGS: Heart is normal in size. Stable mediastinal contours. Calcified mediastinal lymph nodes typical of prior granulomatous disease. Minor aortic atherosclerosis. The lungs are clear. Pulmonary vasculature is normal. No consolidation, pleural effusion, or pneumothorax. No acute osseous abnormalities are seen. IMPRESSION: 1. No acute chest findings. 2. Minor aortic atherosclerosis. Electronically Signed     By: Narda Rutherford M.D.   On: 03/27/2023 23:20    Cardiac Studies   Echo: pending  Patient Profile     72 y.o. male with a history of type 2 DM, hyperlipidemia, and traumatic SDH in 2013 who was seen for chest pain/NSTEMI.  Assessment & Plan    NSTEMI -- Developed acute onset of centralized chest pain with radiation up into his left shoulder and arm around 5 PM yesterday.  Initially thought this was reflux related.  ED high-sensitivity troponin initially 2304>>> 5049.  Evaluated by the overnight cardiology fellow with recommendations for cardiac catheterization.  He is still having chest pain 3/10 presently.  Most recent troponin resulted at 17,821. -- IV heparin, nitro, aspirin, statin -- Plan for cardiac catheterization this morning -- echo pending  Shared Decision Making/Informed Consent{ The risks [stroke (1 in 1000), death (1  in 1000), kidney failure [usually temporary] (1 in 500), bleeding (1 in 200), allergic reaction [possibly serious] (1 in 200)], benefits (diagnostic support and management of coronary artery disease) and alternatives of a cardiac catheterization were discussed in detail with Frederick Knight and he is willing to proceed.  HTN -- well controlled -- continue metoprolol  DM -- Hgb A1c 6.1 -- SSI while inpatient  Elevated TSH -- TSH 6.798  -- per primary  For questions or updates, please contact [facility] Please consult [email or web address] for contact info under        Signed, Laverda Page, NP  03/28/2023, 8:24 AM     ATTENDING ATTESTATION:  After conducting a review of all available clinical information with the care team, interviewing the patient, and performing a physical exam, I agree with the findings and plan described in this note.   GEN: No acute distress.   HEENT:  MMM, no JVD, no scleral icterus Cardiac: RRR, no murmurs, rubs, or gallops.  Respiratory: Clear to auscultation bilaterally. GI: Soft, nontender, non-distended  MS: No edema; No deformity. Neuro:  Nonfocal  Vasc:  +2 radial pulses  72 y.o. male with a history of type 2 DM, hyperlipidemia, and traumatic SDH in 2013 here with ACS.  EKG without acute ischemic changes.  Troponins rising.  Continues to have 2/10 chest pain despite nitro/hep gtts and controlled BP and HR.  Will refer to coronary angiography.  Patient understands and agrees with plan.  I have reviewed the risks, indications, and alternatives to cardiac catheterization, possible angioplasty, and stenting with the patient. Risks include but are not limited to bleeding, infection, vascular injury, stroke, myocardial infection, arrhythmia, kidney injury, radiation-related injury in the case of prolonged fluoroscopy use, emergency cardiac surgery, and death. The patient understands the risks of serious complication is 1-2 in 1000 with diagnostic cardiac  cath and 1-2% or less with angioplasty/stenting.    Alverda Skeans, MD Pager [phone]

## 2023-03-28 NOTE — TOC Benefit Eligibility Note (Signed)
Patient Advocate Encounter  Insurance verification completed.    The patient is currently admitted and upon discharge could be taking Brilinta 90 mg.  The current 30 day co-pay is $47.00.   The patient is insured through [employer] Medicare Part D  This test claim was processed through [facility]- copay amounts may vary at other pharmacies due to pharmacy/plan contracts, or as the patient moves through the different stages of their insurance plan.  Takeela Peil, CPHT Pharmacy Patient Advocate Specialist  Pharmacy Patient Advocate Team Direct Number: [phone]  Fax: [phone]       

## 2023-03-28 NOTE — Assessment & Plan Note (Addendum)
S/p LHC yesterday, 100% stenosed mid LAD now s/p stent. Plan for PCI for RCA (85% stenosed) today, and 80% stenosed diagonal lesion (medical v surgical tx). Patient without pain today. Continue DAPT, high dose statin. -Cardiology consulted, appreciate reccs -NPO -O2 supplementation as needed, goal O2 greater than 90% -Electrolyte goal K+ > 4.0, Mg > 2.0

## 2023-03-28 NOTE — ED Provider Notes (Signed)
[place] EMERGENCY DEPARTMENT AT [facility] Provider Note   CSN: [id number] Arrival date & time: 03/27/23  2214     History  Chief Complaint  Patient presents with   Chest Pain    Frederick Knight is a 72 y.o. male.  The history is provided by the patient.  Chest Pain He has history of diabetes, hyperlipidemia and comes in because of a pressure feeling in the left side of his chest since 3 PM.  Symptoms started while he was watching television.  Discomfort has radiated to the back into the left arm.  He denies dyspnea, nausea, diaphoresis.  He is never had anything like this before.  He is a non-smoker and denies history of hypertension and denies family history of premature coronary atherosclerosis.  He did take aspirin 324 mg at home.  He denies any illicit drug use.   Home Medications Prior to Admission medications   Medication Sig Start Date End Date Taking? Authorizing Provider  Ascorbic Acid (VITAMIN C PO) Take by mouth. Patient not taking: Reported on 03/23/2023    [provider]  famotidine (PEPCID) 20 MG tablet Take 1 tablet (20 mg total) by mouth 2 (two) times daily. Patient not taking: Reported on 03/23/2023 04/16/19   Lennox Solders, MD  fluticasone [facility]) 50 MCG/ACT nasal spray Place 2 sprays into both nostrils daily. Patient not taking: Reported on 03/23/2023 07/23/20   Leeroy Bock, MD  hydrocortisone 2.5 % cream APPLY TO AFFECTED AREA TWICE A DAY Patient not taking: Reported on 03/23/2023 11/13/17   Beaulah Dinning, MD  ketoconazole (NIZORAL) 2 % cream Apply 1 application topically daily as needed for irritation. Patient not taking: Reported on 03/23/2023 02/26/19   Lennox Solders, MD  meclizine (ANTIVERT) 12.5 MG tablet Take 1 tablet (12.5 mg total) by mouth 3 (three) times daily as needed for dizziness. Patient not taking: Reported on 03/23/2023 06/07/19   Lennox Solders, MD  meloxicam (MOBIC) 15 MG tablet TAKE 1 TABLET (15 MG  TOTAL) BY MOUTH DAILY. Patient not taking: Reported on 03/23/2023 03/03/23 03/02/24  Juanda Chance, NP  metFORMIN (GLUCOPHAGE-XR) 500 MG 24 hr tablet TAKE 1 TABLET BY MOUTH EVERY DAY WITH BREAKFAST 03/14/23   Littie Deeds, MD  Multiple Vitamin (MULTIVITAMIN) tablet Take 1 tablet by mouth daily. Patient not taking: Reported on 03/23/2023    [provider]  oxybutynin (DITROPAN-XL) 5 MG 24 hr tablet Take 5 mg by mouth daily.    [provider]  rosuvastatin (CRESTOR) 10 MG tablet Take 1 tablet by mouth daily. 10/03/22   [provider]  tamsulosin (FLOMAX) 0.4 MG CAPS capsule TAKE 2 CAPSULES BY MOUTH EVERY DAY 05/03/22   Littie Deeds, MD  VITAMIN D PO Take by mouth. Patient not taking: Reported on 03/23/2023    [provider]      Allergies    Patient has no known allergies.    Review of Systems   Review of Systems  Cardiovascular:  Positive for chest pain.  All other systems reviewed and are negative.   Physical Exam Updated Vital Signs BP (!) 162/91 (BP Location: Right Arm)   Pulse 73   Temp 97.9 F (36.6 C)   Resp 16   Ht 5\' 7"  (1.702 m)   Wt 74.8 kg   SpO2 99%   BMI 25.84 kg/m  Physical Exam Vitals and nursing note reviewed.   72 year old male, resting comfortably and in no acute distress.  Vital signs are significant for elevated blood pressure. Oxygen saturation is 99%, which is normal. Head is normocephalic and atraumatic. PERRLA, EOMI. Oropharynx is clear. Neck is nontender and supple without adenopathy or JVD. Back is nontender and there is no CVA tenderness. Lungs are clear without rales, wheezes, or rhonchi. Chest is nontender. Heart has regular rate and rhythm without murmur. Abdomen is soft, flat, nontender without masses or hepatosplenomegaly and peristalsis is normoactive. Extremities have no cyanosis or edema, full range of motion is present. Skin is warm and dry without rash. Neurologic: Mental status is normal, cranial  nerves are intact, there are no motor or sensory deficits.  ED Results / Procedures / Treatments   Labs (all labs ordered are listed, but only abnormal results are displayed) Labs Reviewed  COMPREHENSIVE METABOLIC PANEL - Abnormal; Notable for the following components:      Result Value   Glucose, Bld 139 (*)    AST 63 (*)    All other components within normal limits  CBC WITH DIFFERENTIAL/PLATELET - Abnormal; Notable for the following components:   Platelets 136 (*)    All other components within normal limits  TROPONIN I (HIGH SENSITIVITY) - Abnormal; Notable for the following components:   Troponin I (High Sensitivity) 2,304 (*)    All other components within normal limits  HEPARIN LEVEL (UNFRACTIONATED)  TROPONIN I (HIGH SENSITIVITY)    EKG EKG Interpretation  Date/Time:  Monday March 27 2023 22:25:21 EDT Ventricular Rate:  67 PR Interval:  172 QRS Duration: 74 QT Interval:  402 QTC Calculation: 424 R Axis:   46 Text Interpretation: Normal sinus rhythm Low voltage QRS Borderline ECG When compared with ECG of 25-May-2018 11:03, No significant change was found Confirmed by Dione Booze ([id number]) on 03/28/2023 12:29:45 AM   EKG Interpretation  Date/Time:  Tuesday March 28 2023 00:55:01 EDT Ventricular Rate:  74 PR Interval:  171 QRS Duration: 85 QT Interval:  405 QTC Calculation: 450 R Axis:   53 Text Interpretation: Sinus rhythm Low voltage, extremity leads Consider anterior infarct When compared with ECG of 03/27/2023, No significant change was found Confirmed by Dione Booze ([id number]) on 03/28/2023 12:59:47 AM        Radiology DG Chest 2 View  Result Date: 03/27/2023 CLINICAL DATA:  Chest pain. EXAM: CHEST - 2 VIEW COMPARISON:  05/25/2018 FINDINGS: Heart is normal in size. Stable mediastinal contours. Calcified mediastinal lymph nodes typical of prior granulomatous disease. Minor aortic atherosclerosis. The lungs are clear. Pulmonary vasculature is normal. No  consolidation, pleural effusion, or pneumothorax. No acute osseous abnormalities are seen. IMPRESSION: 1. No acute chest findings. 2. Minor aortic atherosclerosis. Electronically Signed   By: Narda Rutherford M.D.   On: 03/27/2023 23:20    Procedures Procedures  Cardiac monitor shows normal sinus rhythm, per my interpretation.  Medications Ordered in ED Medications  heparin bolus via infusion 3,000 Units (has no administration in time range)  heparin ADULT infusion 100 units/mL (25000 units/236mL) (has no administration in time range)  famotidine (PEPCID) tablet 10 mg (10 mg Oral Given 03/27/23 2309)    ED Course/ Medical Decision Making/ A&P                             Medical Decision Making Amount and/or Complexity of Data Reviewed Labs: ordered.  Risk Prescription drug management. Decision regarding hospitalization.   Chest discomfort concerning for ACS.  Also consider GERD, pulmonary embolism, aortic dissection.  I  have reviewed and interpreted his electrocardiogram, and my interpretation is low voltage QRS without ST or T changes.  Chest x-ray shows no acute cardiopulmonary process.  Have independently viewed the images, and agree with the radiologist's interpretation.  I have reviewed and interpreted his laboratory test, and my interpretation is mild elevation of random glucose, mild elevation of AST which is probably not clinically significant, marked elevation of troponin consistent with non-STEMI, mild thrombocytopenia which is stable and not clinically significant.  I have reviewed and interpreted his repeat electrocardiogram, and my interpretation is no change from initial ECG.  I have ordered heparin and I have also ordered a trial of nitroglycerin.  He had partial relief of pain with the nitroglycerin, I am ordering a nitroglycerin drip as well as a dose of morphine.  Second troponin has come back as significantly elevated compared with the first.  I have discussed case with  Dr. Okey Dupre of cardiology service and Dr. Melissa Noon of the family practice service who agreed to admit the patient.  CRITICAL CARE Performed by: Dione Booze Total critical care time: 60 minutes Critical care time was exclusive of separately billable procedures and treating other patients. Critical care was necessary to treat or prevent imminent or life-threatening deterioration. Critical care was time spent personally by me on the following activities: development of treatment plan with patient and/or surrogate as well as nursing, discussions with consultants, evaluation of patient's response to treatment, examination of patient, obtaining history from patient or surrogate, ordering and performing treatments and interventions, ordering and review of laboratory studies, ordering and review of radiographic studies, pulse oximetry and re-evaluation of patient's condition.  Final Clinical Impression(s) / ED Diagnoses Final diagnoses:  Non-ST elevation MI (NSTEMI)  Elevated random blood glucose level  Elevated AST (SGOT)  Thrombocytopenia    Rx / DC Orders ED Discharge Orders     None         Dione Booze, MD 03/28/23 0157

## 2023-03-28 NOTE — ED Notes (Signed)
Pt transported to cath lab.  

## 2023-03-28 NOTE — H&P (Addendum)
Hospital Admission History and Physical Service Pager: [phone]  Patient name: Frederick Knight Medical record number: [id number] Date of Birth: [birth year] Age: 72 y.o. Gender: male  Primary Care Provider: Littie Deeds, MD Consultants: Cardiology Code Status: FULL which was confirmed with family if patient unable to confirm   Preferred Emergency Contact: Jamelle Haring Clearence Cheek) [phone]  Chief Complaint: Chest pain  Assessment and Plan: Frederick Knight is a 72 y.o. male presenting with chest pain . Differential for this patient's presentation of this includes ACS (STEMI vs. NSTEMI), unstable angina, GERD, PE, aortic dissection, pericarditis.  NSTEMI most likely given hx co-morbidities with no acute ST/T wave changes on EKG, but significantly elevated troponins. Although he does have history of GERD, these symptoms are more fitting with typical chest pain concerning for ACS.  Chest pain with high risk of acute coronary syndrome Clinical presentation fitting with NSTEMI given chest pain with significantly elevated troponin greater than 2000 on admission, EKG without ST elevation. Multiple risk factors for ACS including male sex, older age, HLD/pre-DM. Plan for left heart cath/angiography per cardiology. -Admit to FPTS, cardiac telemetry, attending Dr. Terisa Starr -Cardiology consulted, appreciate reccs -Antithrombotic therapy: Continue heparin gtt. -Serial troponins -NPO -Plan for TTE in AM -O2 supplementation as needed, goal O2 greater than 90% -Electrolyte goal K+ > 4.0, Mg > 2.0 -Risk stratification labs: TSH, Lipid panel, A1c  T2DM (type 2 diabetes mellitus) Last A1c 6.0%.  Taking metformin 500 mg 24-hour tablet daily. -Hold home metformin -A1c -CBG at meals and bedtime -sSSI  Hyperlipidemia Previously on rosuvastatin 10 mg daily. Last lipid panel in 2023 with LDL 84 - Start Atorvostatin 80 mg daily, with goal LDL <70 - Lipid panel   Other conditions chronic and  stable BPH-hold Flomax given borderline low blood pressures, can resume when appropriate  FEN/GI: NPO VTE Prophylaxis: Heparin gtt  Disposition: Progressive  History of Present Illness:  Frederick Knight is a 72 y.o. male presenting with chest pain. Started at 4pm this afternoon, radiated to left arm and back of left shoulder. Described as a "heavy, pressure-like" sensation. Pain was constant. He took 325 mg ASA and antacid prior to arrival to ED.  Has never had chest pain like this before.  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  Has multiple physicians within the family, who he talked with and recommended that he come to the ER.  No known history of MI or CVA. Reports compliance with home medications. Denies headache or SOB.  No nausea or vomiting.  Last meal was at 3 PM, with onset of chest pain after that.  In the ED, patient was hemodynamically stable.  Labs significant for troponin 2,304.  Mild thrombocytopenia 136,000.  Isolated elevation AST 63.  He was generally hemodynamically stable with BP elevated 162/91, pulse 73, respirations 16.  Chest x-ray unremarkable for any acute pulmonary process, but does show mild aortic atherosclerosis. He had 2 EKGs which were unchanged from each other.  Started on heparin drip as well as trial of IV nitroglycerin.  He did have quite a bit relief with p.o. nitroglycerin.  Received morphine x 1.  Cardiology was consulted.  Admitted to medicine service.  Review Of Systems: Per HPI   Pertinent Past Medical History: T2DM BPH GERD  HLD Remainder reviewed in history tab.   Pertinent Past Surgical History: Emergency burr hole craniectomy in 2013 Remainder reviewed in history tab.   Pertinent  Social History: Tobacco use: Never Alcohol use: Denies Other Substance use: Denies  Pertinent Family History: No family history of premature CAD  Remainder reviewed in history tab.    Important Outpatient Medications: Atorvastatin 10 mg daily Metformin 500 mg daily Remainder reviewed in medication history.   Objective: BP 120/79   Pulse 61   Temp 97.7 F (36.5 C) (Oral)   Resp 14   Ht  (1.702 m)   Wt 74.8 kg   SpO2 100%   BMI 25.84 kg/m  Exam: General: Very pleasant, lying in bed in no distress, talkative Eyes: Pupils PERRLA, no conjunctival injection ENTM: No oropharyngeal erythema, missing multiple teeth with poor dentition Neck: No cervical lymphadenopathy Cardiovascular: Regular rate and rhythm Respiratory: Normal work of breathing on room air.  No wheezing or crackles Gastrointestinal: Soft, nontender nondistended MSK: No joint deformities Derm: No visible rashes or lesions Extremities: No peripheral edema.  Pulses easily palpable Neuro: ANO x 4, speech clear and fluent Psych: Pleasant.  Normal mood and affect  Labs:  CBC BMET  Recent Labs  Lab 03/27/23 2308  WBC 9.8  HGB 16.3  HCT 46.7  PLT 136*   Recent Labs  Lab 03/27/23 2308  NA 135  K 3.8  CL 99  CO2 24  BUN 12  CREATININE 1.09  GLUCOSE 139*  CALCIUM 9.7    Troponin 2,304 >>5,049  EKG: normal sinus rhythm.   Imaging Studies Performed: Chest x-ray: Impression from Radiologist: No acute chest findings.  Minor aortic atherosclerosis.    Darral Dash, DO 03/28/2023, 4:14 AM PGY-2, [facility]  FPTS Intern pager: [phone], text pages welcome Secure chat group [facility] Teaching Service

## 2023-03-28 NOTE — Progress Notes (Addendum)
Rounding Note    Patient Name: Frederick Knight Date of Encounter: 03/28/2023  [facility] Cardiologist: None   Subjective   Still with 3/10 mild chest discomfort. Family at the bedside.   Inpatient Medications    Scheduled Meds:  aspirin EC  81 mg Oral Daily   atorvastatin  80 mg Oral Daily   insulin aspart  0-6 Units Subcutaneous TID WC   metoprolol tartrate  12.5 mg Oral BID   sodium chloride flush  3 mL Intravenous Q12H   Continuous Infusions:  heparin 900 Units/hr (03/28/23 0713)   nitroGLYCERIN 10 mcg/min (03/28/23 0714)   PRN Meds:    Vital Signs    Vitals:   03/28/23 0600 03/28/23 0605 03/28/23 0610 03/28/23 0700  BP: 104/72 119/69 122/78 116/72  Pulse: 90 79 71 83  Resp: (!) 27 (!) Temp:      TempSrc:      SpO2: 100% 100% 100% 98%  Weight:      Height:       No intake or output data in the 24 hours ending 03/28/23 0824    03/27/2023   10:32 PM 03/23/2023    3:34 PM 12/15/2022    2:14 PM  Last 3 Weights  Weight (lbs) 165 lb 171 lb 161 lb  Weight (kg) 74.844 kg 77.565 kg 73.029 kg      Telemetry    Sinus Rhythm - Personally Reviewed  ECG    Sinus Rhythm, slight up-sloping of ST segment in inferolateral leads - Personally Reviewed  Physical Exam   GEN: No acute distress.   Neck: No JVD Cardiac: RRR, no murmurs, rubs, or gallops.  Respiratory: Clear to auscultation bilaterally. GI: Soft, nontender, non-distended  MS: No edema; No deformity. Neuro:  Nonfocal  Psych: Normal affect   Labs    High Sensitivity Troponin:   Recent Labs  Lab 03/27/23 2308 03/28/23 0105 03/28/23 0703  TROPONINIHS 2,304* 5,049* 17,821*     Chemistry Recent Labs  Lab 03/27/23 2308 03/28/23 0419  NA 135 133*  K 3.8 3.8  CL 99 100  CO2 24 20*  GLUCOSE 139* 139*  BUN 12 9  CREATININE 1.09 1.05  CALCIUM 9.7 9.2  PROT 7.3 6.9  ALBUMIN 4.1 3.9  AST 63* 119*  ALT 39 46*  ALKPHOS 61 58  BILITOT 0.6 0.8  GFRNONAA >60 >60   ANIONGAP 12 13    Lipids No results for input(s): "CHOL", "TRIG", "HDL", "LABVLDL", "LDLCALC", "CHOLHDL" in the last 168 hours.  Hematology Recent Labs  Lab 03/27/23 2308 03/28/23 0419  WBC 9.8 12.5*  RBC 5.29 5.25  HGB 16.3 16.4  HCT 46.7 46.9  MCV 88.3 89.3  MCH 30.8 31.2  MCHC 34.9 35.0  RDW 12.8 12.8  PLT 136* 131*   Thyroid  Recent Labs  Lab 03/28/23 0419  TSH 6.798*    BNPNo results for input(s): "BNP", "PROBNP" in the last 168 hours.  DDimer No results for input(s): "DDIMER" in the last 168 hours.   Radiology    DG Chest 2 View  Result Date: 03/27/2023 CLINICAL DATA:  Chest pain. EXAM: CHEST - 2 VIEW COMPARISON:  05/25/2018 FINDINGS: Heart is normal in size. Stable mediastinal contours. Calcified mediastinal lymph nodes typical of prior granulomatous disease. Minor aortic atherosclerosis. The lungs are clear. Pulmonary vasculature is normal. No consolidation, pleural effusion, or pneumothorax. No acute osseous abnormalities are seen. IMPRESSION: 1. No acute chest findings. 2. Minor aortic atherosclerosis. Electronically Signed  By: Narda Rutherford M.D.   On: 03/27/2023 23:20    Cardiac Studies   Echo: pending  Patient Profile     72 y.o. male with a history of type 2 DM, hyperlipidemia, and traumatic SDH in 2013 who was seen for chest pain/NSTEMI.  Assessment & Plan    NSTEMI -- Developed acute onset of centralized chest pain with radiation up into his left shoulder and arm around 5 PM yesterday.  Initially thought this was reflux related.  ED high-sensitivity troponin initially 2304>>> 5049.  Evaluated by the overnight cardiology fellow with recommendations for cardiac catheterization.  He is still having chest pain 3/10 presently.  Most recent troponin resulted at 17,821. -- IV heparin, nitro, aspirin, statin -- Plan for cardiac catheterization this morning -- echo pending  Shared Decision Making/Informed Consent{ The risks [stroke (1 in 1000), death (1  in 1000), kidney failure [usually temporary] (1 in 500), bleeding (1 in 200), allergic reaction [possibly serious] (1 in 200)], benefits (diagnostic support and management of coronary artery disease) and alternatives of a cardiac catheterization were discussed in detail with Frederick Knight and he is willing to proceed.  HTN -- well controlled -- continue metoprolol  DM -- Hgb A1c 6.1 -- SSI while inpatient  Elevated TSH -- TSH 6.798  -- per primary  For questions or updates, please contact [facility] Please consult [email or web address] for contact info under        Signed, Laverda Page, NP  03/28/2023, 8:24 AM     ATTENDING ATTESTATION:  After conducting a review of all available clinical information with the care team, interviewing the patient, and performing a physical exam, I agree with the findings and plan described in this note.   GEN: No acute distress.   HEENT:  MMM, no JVD, no scleral icterus Cardiac: RRR, no murmurs, rubs, or gallops.  Respiratory: Clear to auscultation bilaterally. GI: Soft, nontender, non-distended  MS: No edema; No deformity. Neuro:  Nonfocal  Vasc:  +2 radial pulses  71 y.o. male with a history of type 2 DM, hyperlipidemia, and traumatic SDH in 2013 here with ACS.  EKG without acute ischemic changes.  Troponins rising.  Continues to have 2/10 chest pain despite nitro/hep gtts and controlled BP and HR.  Will refer to coronary angiography.  Patient understands and agrees with plan.  I have reviewed the risks, indications, and alternatives to cardiac catheterization, possible angioplasty, and stenting with the patient. Risks include but are not limited to bleeding, infection, vascular injury, stroke, myocardial infection, arrhythmia, kidney injury, radiation-related injury in the case of prolonged fluoroscopy use, emergency cardiac surgery, and death. The patient understands the risks of serious complication is 1-2 in 1000 with diagnostic cardiac  cath and 1-2% or less with angioplasty/stenting.    Alverda Skeans, MD Pager [phone]

## 2023-03-29 ENCOUNTER — Other Ambulatory Visit (HOSPITAL_COMMUNITY): Payer: Self-pay

## 2023-03-29 ENCOUNTER — Inpatient Hospital Stay (HOSPITAL_COMMUNITY)
Admission: EM | Disposition: A | Discharge status: Home / Self Care | Payer: Self-pay | Source: Home / Self Care | Attending: Family Medicine

## 2023-03-29 DIAGNOSIS — R079 Chest pain, unspecified: Secondary | ICD-10-CM | POA: Diagnosis not present

## 2023-03-29 DIAGNOSIS — I2510 Atherosclerotic heart disease of native coronary artery without angina pectoris: Secondary | ICD-10-CM

## 2023-03-29 DIAGNOSIS — I255 Ischemic cardiomyopathy: Secondary | ICD-10-CM | POA: Insufficient documentation

## 2023-03-29 HISTORY — PX: CORONARY STENT INTERVENTION: CATH118234

## 2023-03-29 LAB — CBC
HCT: 43.7 % (ref 39.0–52.0)
Hemoglobin: 15.3 g/dL (ref 13.0–17.0)
MCH: 30.8 pg (ref 26.0–34.0)
MCHC: 35 g/dL (ref 30.0–36.0)
MCV: 88.1 fL (ref 80.0–100.0)
Platelets: 111 10*3/uL — ABNORMAL LOW (ref 150–400)
RBC: 4.96 MIL/uL (ref 4.22–5.81)
RDW: 13.2 % (ref 11.5–15.5)
WBC: 10.3 10*3/uL (ref 4.0–10.5)
nRBC: 0 % (ref 0.0–0.2)

## 2023-03-29 LAB — BASIC METABOLIC PANEL
Anion gap: 10 (ref 5–15)
BUN: 9 mg/dL (ref 8–23)
CO2: 20 mmol/L — ABNORMAL LOW (ref 22–32)
Calcium: 8.8 mg/dL — ABNORMAL LOW (ref 8.9–10.3)
Chloride: 106 mmol/L (ref 98–111)
Creatinine, Ser: 1.05 mg/dL (ref 0.61–1.24)
GFR, Estimated: >60 mL/min (ref >60)
Glucose, Bld: 127 mg/dL — ABNORMAL HIGH (ref 70–99)
Potassium: 3.7 mmol/L (ref 3.5–5.1)
Sodium: 136 mmol/L (ref 135–145)

## 2023-03-29 LAB — GLUCOSE, CAPILLARY
Glucose-Capillary: 119 mg/dL — ABNORMAL HIGH (ref 70–99)
Glucose-Capillary: 104 mg/dL — ABNORMAL HIGH (ref 70–99)
Glucose-Capillary: 82 mg/dL (ref 70–99)
Glucose-Capillary: 99 mg/dL (ref 70–99)

## 2023-03-29 LAB — LIPID PANEL
Cholesterol: 114 mg/dL (ref 0–200)
HDL: 45 mg/dL (ref >40)
LDL Cholesterol: 53 mg/dL (ref 0–99)
Total CHOL/HDL Ratio: 2.5 RATIO
Triglycerides: 79 mg/dL (ref <150)
VLDL: 16 mg/dL (ref 0–40)

## 2023-03-29 LAB — HEPATITIS PANEL, ACUTE
HCV Ab: NONREACTIVE
Hep A IgM: NONREACTIVE
Hep B C IgM: NONREACTIVE
Hepatitis B Surface Ag: NONREACTIVE

## 2023-03-29 LAB — POCT ACTIVATED CLOTTING TIME: Activated Clotting Time: 477 seconds

## 2023-03-29 LAB — HEPARIN LEVEL (UNFRACTIONATED): Heparin Unfractionated: 0.14 IU/mL — ABNORMAL LOW (ref 0.30–0.70)

## 2023-03-29 SURGERY — CORONARY STENT INTERVENTION
Anesthesia: LOCAL

## 2023-03-29 MED ORDER — MIDAZOLAM HCL 2 MG/2ML IJ SOLN
INTRAMUSCULAR | Status: AC
  Filled 2023-03-29: qty 2

## 2023-03-29 MED ORDER — FENTANYL CITRATE (PF) 100 MCG/2ML IJ SOLN
INTRAMUSCULAR | Status: DC | PRN
  Administered 2023-03-29: 25 ug via INTRAVENOUS

## 2023-03-29 MED ORDER — POTASSIUM CHLORIDE 10 MEQ/100ML IV SOLN
10.0000 meq | INTRAVENOUS | Status: AC
  Administered 2023-03-29 (×2): 10 meq via INTRAVENOUS
  Filled 2023-03-29 (×2): qty 100

## 2023-03-29 MED ORDER — VERAPAMIL HCL 2.5 MG/ML IV SOLN
INTRAVENOUS | Status: AC
  Filled 2023-03-29: qty 2

## 2023-03-29 MED ORDER — HEPARIN SODIUM (PORCINE) 1000 UNIT/ML IJ SOLN
INTRAMUSCULAR | Status: DC | PRN
  Administered 2023-03-29: 8000 [IU] via INTRAVENOUS

## 2023-03-29 MED ORDER — NITROGLYCERIN 1 MG/10 ML FOR IR/CATH LAB
INTRA_ARTERIAL | Status: AC
  Filled 2023-03-29: qty 10

## 2023-03-29 MED ORDER — SODIUM CHLORIDE 0.9 % WEIGHT BASED INFUSION
1.0000 mL/kg/h | INTRAVENOUS | Status: AC
  Administered 2023-03-29: 1 mL/kg/h via INTRAVENOUS

## 2023-03-29 MED ORDER — IOHEXOL 350 MG/ML SOLN
INTRAVENOUS | Status: DC | PRN
  Administered 2023-03-29: 45 mL

## 2023-03-29 MED ORDER — HEPARIN SODIUM (PORCINE) 1000 UNIT/ML IJ SOLN
INTRAMUSCULAR | Status: AC
  Filled 2023-03-29: qty 10

## 2023-03-29 MED ORDER — LIDOCAINE HCL (PF) 1 % IJ SOLN
INTRAMUSCULAR | Status: DC | PRN
  Administered 2023-03-29: 2 mL via INTRADERMAL

## 2023-03-29 MED ORDER — MIDAZOLAM HCL 2 MG/2ML IJ SOLN
INTRAMUSCULAR | Status: DC | PRN
  Administered 2023-03-29: 1 mg via INTRAVENOUS

## 2023-03-29 MED ORDER — FENTANYL CITRATE (PF) 100 MCG/2ML IJ SOLN
INTRAMUSCULAR | Status: AC
  Filled 2023-03-29: qty 2

## 2023-03-29 MED ORDER — LOSARTAN POTASSIUM 25 MG PO TABS
12.5000 mg | ORAL_TABLET | Freq: Every day | ORAL | Status: DC
  Administered 2023-03-29: 12.5 mg via ORAL
  Filled 2023-03-29: qty 1

## 2023-03-29 MED ORDER — DAPAGLIFLOZIN PROPANEDIOL 10 MG PO TABS
10.0000 mg | ORAL_TABLET | Freq: Every day | ORAL | Status: DC
  Administered 2023-03-30: 10 mg via ORAL
  Filled 2023-03-29: qty 1

## 2023-03-29 MED ORDER — LIDOCAINE HCL (PF) 1 % IJ SOLN
INTRAMUSCULAR | Status: AC
  Filled 2023-03-29: qty 30

## 2023-03-29 MED ORDER — VERAPAMIL HCL 2.5 MG/ML IV SOLN
INTRAVENOUS | Status: DC | PRN
  Administered 2023-03-29: 10 mL via INTRA_ARTERIAL

## 2023-03-29 MED ORDER — SODIUM CHLORIDE 0.9% FLUSH
3.0000 mL | Freq: Two times a day (BID) | INTRAVENOUS | Status: DC
  Administered 2023-03-30 (×2): 3 mL via INTRAVENOUS

## 2023-03-29 MED ORDER — ENOXAPARIN SODIUM 40 MG/0.4ML IJ SOSY: 40.0000 mg | PREFILLED_SYRINGE | INTRAMUSCULAR | Status: DC

## 2023-03-29 MED ORDER — SODIUM CHLORIDE 0.9% FLUSH: 3.0000 mL | INTRAVENOUS | Status: DC | PRN

## 2023-03-29 MED ORDER — SODIUM CHLORIDE 0.9 % IV SOLN: 250.0000 mL | INTRAVENOUS | Status: DC | PRN

## 2023-03-29 MED ORDER — HEPARIN (PORCINE) IN NACL 1000-0.9 UT/500ML-% IV SOLN
INTRAVENOUS | Status: DC | PRN
  Administered 2023-03-29: 1000 mL

## 2023-03-29 MED ORDER — METOPROLOL SUCCINATE ER 25 MG PO TB24
25.0000 mg | ORAL_TABLET | Freq: Every day | ORAL | Status: DC
  Administered 2023-03-29: 25 mg via ORAL
  Filled 2023-03-29: qty 1

## 2023-03-29 SURGICAL SUPPLY — 19 items
BALL SAPPHIRE NC24 4.5X10 (BALLOONS) ×1
BALLN EMERGE MR 2.5X12 (BALLOONS) ×1
BALLOON EMERGE MR 2.5X12 (BALLOONS) IMPLANT
BALLOON SAPPHIRE NC24 4.5X10 (BALLOONS) IMPLANT
BAND ZEPHYR COMPRESS 30 LONG (HEMOSTASIS) IMPLANT
CATH LAUNCHER 6FR AL1 (CATHETERS) IMPLANT
CATHETER LAUNCHER 6FR AL1 (CATHETERS) ×1
DEVICE RAD TR BAND REGULAR (VASCULAR PRODUCTS) IMPLANT
GLIDESHEATH SLEND SS 6F .021 (SHEATH) IMPLANT
GUIDEWIRE INQWIRE 1.5J.035X260 (WIRE) IMPLANT
INQWIRE 1.5J .035X260CM (WIRE) ×1
KIT ENCORE 26 ADVANTAGE (KITS) IMPLANT
KIT HEART LEFT (KITS) ×1 IMPLANT
PACK CARDIAC CATHETERIZATION (CUSTOM PROCEDURE TRAY) ×1 IMPLANT
STENT SYNERGY XD 4.0X12 (Permanent Stent) IMPLANT
SYNERGY XD 4.0X12 (Permanent Stent) ×1 IMPLANT
TRANSDUCER W/STOPCOCK (MISCELLANEOUS) ×1 IMPLANT
TUBING CIL FLEX 10 FLL-RA (TUBING) ×1 IMPLANT
WIRE ASAHI PROWATER 180CM (WIRE) IMPLANT

## 2023-03-29 NOTE — Assessment & Plan Note (Signed)
Echocardiogram completed.  LVEF 45-50%, LV mild decreased function with regional wall motion abnormalities and diastolic parameters consistent with grade 1 diastolic dysfunction. -Toprol, Farxiga, losartan per cards

## 2023-03-29 NOTE — Interval H&P Note (Signed)
History and Physical Interval Note:  03/29/2023 3:03 PM  Birt R Osorno  has presented today for surgery, with the diagnosis of cad.  The various methods of treatment have been discussed with the patient and family. After consideration of risks, benefits and other options for treatment, the patient has consented to  Procedure(s): CORONARY STENT INTERVENTION (N/A) as a surgical intervention.  The patient's history has been reviewed, patient examined, no change in status, stable for surgery.  I have reviewed the patient's chart and labs.  Questions were answered to the patient's satisfaction.    Cath Lab Visit (complete for each Cath Lab visit)  Clinical Evaluation Leading to the Procedure:   ACS: Yes.    Non-ACS:    Anginal Classification: CCS III  Anti-ischemic medical therapy: Minimal Therapy (1 class of medications)  Non-Invasive Test Results: No non-invasive testing performed  Prior CABG: No previous CABG       Theron Arista [facility] 03/29/2023 3:03 PM

## 2023-03-29 NOTE — Progress Notes (Signed)
     Daily Progress Note Intern Pager: [phone]  Patient name: Frederick Knight Medical record number: [id number] Date of birth: [birth year] Age: 72 y.o. Gender: male  Primary Care Provider: Littie Deeds, MD Consultants: Cardiology Code Status: FULL  Pt Overview and Major Events to Date:  4/16: Admitted, PCI LAD 4/17: PCI Dist RCA  Assessment and Plan: Frederick Knight is a 72 y.o. male presenting with chest pain and NSTEMI. LAD 100% occluded, now s/p stent. Dist RCA 85% stenosed (staged PCI today) and 80% stenosed Diag lesion (Medical tx v stent). Anticipate DAPT for minimum of 1 year. Risk stratification labs (TSH, A1c, Lipds) obtained- aside from subclinical hypothyroid labs are unremarkable and within goal.  NSTEMI (non-ST elevated myocardial infarction) S/p LHC yesterday, 100% stenosed mid LAD now s/p stent. Plan for PCI for RCA (85% stenosed) today, and 80% stenosed diagonal lesion (medical v surgical tx). Patient without pain today. Continue DAPT, high dose statin. -Cardiology consulted, appreciate reccs -NPO -O2 supplementation as needed, goal O2 greater than 90% -Electrolyte goal K+ > 4.0, Mg > 2.0  Ischemic cardiomyopathy Echocardiogram completed.  LVEF 45-50%, LV mild decreased function with regional wall motion abnormalities and diastolic parameters consistent with grade 1 diastolic dysfunction. -Toprol, Farxiga, losartan per cards  T2DM (type 2 diabetes mellitus) A1c 6.1.  Taking metformin 500 mg 24-hour tablet daily. -Hold home metformin -CBG at meals and bedtime -sSSI  Hyperlipidemia LDL 53 today (goal <70).  - Atorvostatin 80 mg daily, with goal LDL <70   FEN/GI: NPO, heart after procedure PPx: Heparin Dispo:Home tomorrow pending surgical procedures.  Subjective:  Not in pain. Answered questions on the progression of CAD. No acute concerns.   Objective: Temp:  [97.5 F (36.4 C)-98.8 F (37.1 C)] 98.1 F (36.7 C) (04/17 0739) Pulse Rate:  [0-108] 67  (04/17 0730) Resp:  [13-23] 20 (04/17 0730) BP: (94-137)/(51-95) 114/75 (04/17 0730) SpO2:  [88 %-98 %] 95 % (04/17 0730) Physical Exam: General: NAD, resting comfortably  Cardiovascular: RRR, no murmurs.  Respiratory: CTAB, normal WOB on RA Extremities: No edema  Laboratory: Most recent CBC Lab Results  Component Value Date   WBC 10.3 03/29/2023   HGB 15.3 03/29/2023   HCT 43.7 03/29/2023   MCV 88.1 03/29/2023   PLT 111 (L) 03/29/2023   Most recent BMP    Latest Ref Rng & Units 03/29/2023    1:04 AM  BMP  Glucose 70 - 99 mg/dL 147   BUN 8 - 23 mg/dL 9   Creatinine 8.29 - 5.62 mg/dL 1.30   Sodium 865 - 784 mmol/L 136   Potassium 3.5 - 5.1 mmol/L 3.7   Chloride 98 - 111 mmol/L 106   CO2 22 - 32 mmol/L 20   Calcium 8.9 - 10.3 mg/dL 8.8    Hepatitis panel: Non-reactive Lipid panel: Cholesterol 114, triglycerides 79, H DL 45, LDL 53  Tiffany Kocher, DO 03/29/2023, 9:22 AM  PGY-1, [facility] FPTS Intern pager: [phone], text pages welcome Secure chat group [facility] Teaching Service
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
## 2023-03-29 NOTE — Evaluation (Signed)
Physical Therapy Evaluation Patient Details Name: Frederick Knight MRN: [id number] DOB: 1951/11/05 Today's Date: 03/29/2023  History of Present Illness  72 y.o. male presents to [facility] hospital on 03/27/2023 with chest pain. Pt with elevated troponins. S/p 4/16 L heart cath and coronary angiography. PMH includes DMII, HLD, traumatic SDH 2013.  Clinical Impression  PTA pt taking care of his mother, who is in a wheelchair, in a single story home with 2 steps to enter. Pt was completely independent working as a Firefighter at [employer] Pt is currently limited in safe mobility by mild instability, generalized weakness and decreased endurance. Pt is mod I for bed mobility, supervision for transfers and min guard for ambulation in hallway. Pt will likely not have any PT needs at discharge, however PT will continue to follow acutely to progress mobility and perform stair training.        Recommendations for follow up therapy are one component of a multi-disciplinary discharge planning process, led by the attending physician.  Recommendations may be updated based on patient status, additional functional criteria and insurance authorization.     Assistance Recommended at Discharge Intermittent Supervision/Assistance  Patient can return home with the following  Other (comment) (family assistance for care of his mother)    Equipment Recommendations None recommended by PT  Recommendations for Other Services  OT consult    Functional Status Assessment Patient has had a recent decline in their functional status and demonstrates the ability to make significant improvements in function in a reasonable and predictable amount of time.     Precautions / Restrictions Precautions Precautions: None Restrictions Weight Bearing Restrictions: No      Mobility  Bed Mobility Overal bed mobility: Modified Independent             General bed mobility comments: HoB elevated, use of bedrails and increased  time and effort to come to EoB    Transfers Overall transfer level: Needs assistance Equipment used: None Transfers: Sit to/from Stand Sit to Stand: Supervision           General transfer comment: supervision for safety    Ambulation/Gait Ambulation/Gait assistance: Min guard Gait Distance (Feet): 400 Feet Assistive device: IV Pole, None Gait Pattern/deviations: Step-through pattern, Shuffle Gait velocity: slowed Gait velocity interpretation: 1.31 - 2.62 ft/sec, indicative of limited community ambulator   General Gait Details: min guard on gait belt, started ambulation pushing IV pole due to mild instability no overt LoB, able to progress to ambulation without AD and min guard        Balance Overall balance assessment: Mild deficits observed, not formally tested                                           Pertinent Vitals/Pain Pain Assessment Pain Assessment: Faces Faces Pain Scale: Hurts little more Pain Location: headache Pain Descriptors / Indicators: Headache Pain Intervention(s): Limited activity within patient's tolerance, Patient requesting pain meds-RN notified    Home Living Family/patient expects to be discharged to:: Private residence Living Arrangements: Parent Available Help at Discharge: Family;Available PRN/intermittently (daughter lives in [place], sister is coming from [place] to help with mother) Type of Home: House Home Access: Stairs to enter Entrance Stairs-Rails: None Entrance Stairs-Number of Steps: 2   Home Layout: One level Home Equipment: Agricultural consultant (2 wheels);Rollator (4 wheels);BSC/3in1;Wheelchair - manual      Prior Function Prior  Level of Function : Independent/Modified Independent;Driving;Working/employed (works as Firefighter at [employer])                     [employer]   Dominant Hand: Right    Extremity/Trunk Assessment   Upper Extremity Assessment Upper Extremity Assessment: Defer to OT  evaluation    Lower Extremity Assessment Lower Extremity Assessment: Generalized weakness;Overall [facility] for tasks assessed    Cervical / Trunk Assessment Cervical / Trunk Assessment: Normal  Communication   Communication: No difficulties  Cognition Arousal/Alertness: Awake/alert Behavior During Therapy: WFL for tasks assessed/performed Overall Cognitive Status: Within Functional Limits for tasks assessed                                          General Comments General comments (skin integrity, edema, etc.): VSS on RA        Assessment/Plan    PT Assessment Patient needs continued PT services  PT Problem List Decreased activity tolerance;Decreased balance;Decreased mobility       PT Treatment Interventions Gait training;Stair training;Functional mobility training;Therapeutic activities;Therapeutic exercise;Balance training;Cognitive remediation;Patient/family education    PT Goals (Current goals can be found in the Care Plan section)  Acute Rehab PT Goals Patient Stated Goal: get back to teaching PT Goal Formulation: With patient Time For Goal Achievement: 04/12/23 Potential to Achieve Goals: Good    Frequency Min 1X/week        AM-PAC PT "6 Clicks" Mobility  Outcome Measure Help needed turning from your back to your side while in a flat bed without using bedrails?: None Help needed moving from lying on your back to sitting on the side of a flat bed without using bedrails?: None Help needed moving to and from a bed to a chair (including a wheelchair)?: A Little Help needed standing up from a chair using your arms (e.g., wheelchair or bedside chair)?: A Little Help needed to walk in hospital room?: A Little Help needed climbing 3-5 steps with a railing? : A Little 6 Click Score: 20    End of Session Equipment Utilized During Treatment: Gait belt Activity Tolerance: Patient tolerated treatment well Patient left: in chair;with call bell/phone  within reach Nurse Communication: Mobility status;Patient requests pain meds PT Visit Diagnosis: Unsteadiness on feet (R26.81);Muscle weakness (generalized) (M62.81);Difficulty in walking, not elsewhere classified (R26.2)    Time: 1610-9604 PT Time Calculation (min) (ACUTE ONLY): 26 min   Charges:   PT Evaluation $PT Eval Moderate Complexity: 1 Mod PT Treatments $Gait Training: 8-22 mins        Princessa Lesmeister B. Beverely Risen PT, DPT Acute Rehabilitation Services Please use secure chat or  Call Office [phone]   [facility] 03/29/2023, 9:05 AM

## 2023-03-29 NOTE — H&P (View-Only) (Signed)
Rounding Note    Patient Name: Frederick Knight Date of Encounter: 03/29/2023  [facility] Cardiologist: Lynnette Caffey   Subjective   S/p PCI of LAD yesterday; has residual high grade RCA disease scheduled for stage PCI today.  Inpatient Medications    Scheduled Meds:  aspirin  81 mg Oral Daily   aspirin  81 mg Oral Pre-Cath   atorvastatin  80 mg Oral Daily   Chlorhexidine Gluconate Cloth  6 each Topical Daily   insulin aspart  0-6 Units Subcutaneous TID WC   metoprolol tartrate  12.5 mg Oral BID   sodium chloride flush  3 mL Intravenous Q12H   sodium chloride flush  3 mL Intravenous Q12H   sodium chloride flush  3 mL Intravenous Q12H   ticagrelor  90 mg Oral BID   Continuous Infusions:  sodium chloride Stopped (03/29/23 0420)   sodium chloride     sodium chloride     sodium chloride 1 mL/kg/hr (03/29/23 0642)   heparin 1,100 Units/hr (03/29/23 0642)   nitroGLYCERIN 5 mcg/min (03/29/23 0642)   PRN Meds:    Vital Signs    Vitals:   03/29/23 0600 03/29/23 0700 03/29/23 0730 03/29/23 0739  BP: 121/78 115/73 114/75   Pulse: 63 65 67   Resp: Temp:    98.1 F (36.7 C)  TempSrc:    Oral  SpO2: 98% 97% 95%   Weight:      Height:        Intake/Output Summary (Last 24 hours) at 03/29/2023 0857 Last data filed at 03/29/2023 1610 Gross per 24 hour  Intake 2034.55 ml  Output 2300 ml  Net -265.45 ml      03/27/2023   10:32 PM 03/23/2023    3:34 PM 12/15/2022    2:14 PM  Last 3 Weights  Weight (lbs) 165 lb 171 lb 161 lb  Weight (kg) 74.844 kg 77.565 kg 73.029 kg      Telemetry    Sinus Rhythm - Personally Reviewed  ECG    Sinus Rhythm, slight up-sloping of ST segment in inferolateral leads - Personally Reviewed  Physical Exam   GEN: No acute distress.   Neck: No JVD Cardiac: RRR, no murmurs, rubs, or gallops.  Respiratory: Clear to auscultation bilaterally. GI: Soft, nontender, non-distended  MS: No edema; No deformity. Neuro:   Nonfocal  Psych: Normal affect   Labs    High Sensitivity Troponin:   Recent Labs  Lab 03/27/23 2308 03/28/23 0105 03/28/23 0703 03/28/23 0825  TROPONINIHS 2,304* 5,049* 17,821* 18,512*     Chemistry Recent Labs  Lab 03/27/23 2308 03/28/23 0419 03/28/23 0825 03/29/23 0104  NA 135 133*  --  136  K 3.8 3.8  --  3.7  CL 99 100  --  106  CO2 24 20*  --  20*  GLUCOSE 139* 139*  --  127*  BUN 12 9  --  9  CREATININE 1.09 1.05  --  1.05  CALCIUM 9.7 9.2  --  8.8*  MG  --   --  2.1  --   PROT 7.3 6.9  --   --   ALBUMIN 4.1 3.9  --   --   AST 63* 119*  --   --   ALT 39 46*  --   --   ALKPHOS 61 58  --   --   BILITOT 0.6 0.8  --   --   GFRNONAA >60 >60  --  >  60  ANIONGAP 12 13  --  10    Lipids  Recent Labs  Lab 03/29/23 0104  CHOL 114  TRIG 79  HDL 45  LDLCALC 53  CHOLHDL 2.5    Hematology Recent Labs  Lab 03/27/23 2308 03/28/23 0419 03/29/23 0104  WBC 9.8 12.5* 10.3  RBC 5.29 5.25 4.96  HGB 16.3 16.4 15.3  HCT 46.7 46.9 43.7  MCV 88.3 89.3 88.1  MCH 30.8 31.2 30.8  MCHC 34.9 35.0 35.0  RDW 12.8 12.8 13.2  PLT 136* 131* 111*   Thyroid  Recent Labs  Lab 03/28/23 0419 03/28/23 0825  TSH 6.798*  --   FREET4  --  1.11    BNPNo results for input(s): "BNP", "PROBNP" in the last 168 hours.  DDimer No results for input(s): "DDIMER" in the last 168 hours.   Radiology    ECHOCARDIOGRAM COMPLETE  Result Date: 03/28/2023    ECHOCARDIOGRAM REPORT   Patient Name:   Frederick Knight Date of Exam: 03/28/2023 Medical Rec #:  [id number]        Height:       67.0 in Accession #:    [id number]       Weight:       165.0 lb Date of Birth:  Nov 21, 1951        BSA:          1.863 m Patient Age:    72 years         BP:           133/75 mmHg Patient Gender: M                HR:           76 bpm. Exam Location:  Inpatient Procedure: 2D Echo, Color Doppler, Cardiac Doppler and Intracardiac            Opacification Agent Indications:    Acute MI i49.1  History:        Patient  has no prior history of Echocardiogram examinations.                 Risk Factors:Diabetes and Dyslipidemia.  Sonographer:    Irving Burton Senior RDCS Referring Phys: [phone] THOMAS A KELLY  Sonographer Comments: Poor parasternal window due to lung interference. IMPRESSIONS  1. No LV thrombus noted. Left ventricular ejection fraction, by estimation, is 45 to 50%. The left ventricle has mildly decreased function. The left ventricle demonstrates regional wall motion abnormalities (see scoring diagram/findings for description). Left ventricular diastolic parameters are consistent with Grade I diastolic dysfunction (impaired relaxation).  2. Right ventricular systolic function is normal. The right ventricular size is normal. Tricuspid regurgitation signal is inadequate for assessing PA pressure.  3. The mitral valve is grossly normal. Trivial mitral valve regurgitation.  4. The aortic valve was not well visualized. Aortic valve regurgitation is trivial. No aortic stenosis is present.  5. The inferior vena cava is dilated in size with <50% respiratory variability, suggesting right atrial pressure of 15 mmHg. Comparison(s): No prior Echocardiogram. FINDINGS  Left Ventricle: No LV thrombus noted. Left ventricular ejection fraction, by estimation, is 45 to 50%. The left ventricle has mildly decreased function. The left ventricle demonstrates regional wall motion abnormalities. Definity contrast agent was given IV to delineate the left ventricular endocardial borders. The left ventricular internal cavity size was normal in size. There is no left ventricular hypertrophy. Left ventricular diastolic parameters are consistent with Grade I diastolic dysfunction (impaired relaxation).  LV  Wall Scoring: The apical septal segment and apical anterior segment are akinetic. The anterior septum, mid inferoseptal segment, and basal inferoseptal segment are hypokinetic. The anterior wall, entire lateral wall, and entire inferior wall are normal.  Right Ventricle: The right ventricular size is normal. No increase in right ventricular wall thickness. Right ventricular systolic function is normal. Tricuspid regurgitation signal is inadequate for assessing PA pressure. Left Atrium: Left atrial size was normal in size. Right Atrium: Right atrial size was normal in size. Pericardium: There is no evidence of pericardial effusion. Mitral Valve: The mitral valve is grossly normal. Trivial mitral valve regurgitation. Tricuspid Valve: The tricuspid valve is grossly normal. Tricuspid valve regurgitation is trivial. Aortic Valve: The aortic valve was not well visualized. Aortic valve regurgitation is trivial. No aortic stenosis is present. Pulmonic Valve: The pulmonic valve was not well visualized. Pulmonic valve regurgitation is not visualized. Aorta: The ascending aorta was not well visualized and the aortic root is normal in size and structure. Venous: The inferior vena cava is dilated in size with less than 50% respiratory variability, suggesting right atrial pressure of 15 mmHg. IAS/Shunts: No atrial level shunt detected by color flow Doppler.  LEFT VENTRICLE PLAX 2D LVIDd:         3.70 cm     Diastology LVIDs:         2.70 cm     LV e' medial:    4.57 cm/s LV PW:         0.80 cm     LV E/e' medial:  15.8 LV IVS:        1.20 cm     LV e' lateral:   5.66 cm/s LVOT diam:     2.00 cm     LV E/e' lateral: 12.8 LV SV:         56 LV SV Index:   30 LVOT Area:     3.14 cm  LV Volumes (MOD) LV vol d, MOD A2C: 70.5 ml LV vol d, MOD A4C: 84.8 ml LV vol s, MOD A2C: 37.5 ml LV vol s, MOD A4C: 52.7 ml LV SV MOD A2C:     33.0 ml LV SV MOD A4C:     84.8 ml LV SV MOD BP:      31.9 ml RIGHT VENTRICLE RV S prime:     9.14 cm/s TAPSE (M-mode): 1.8 cm LEFT ATRIUM             Index        RIGHT ATRIUM           Index LA diam:        2.60 cm 1.40 cm/m   RA Area:     10.30 cm LA Vol (A2C):   30.9 ml 16.58 ml/m  RA Volume:   18.50 ml  9.93 ml/m LA Vol (A4C):   33.1 ml 17.76 ml/m LA  Biplane Vol: 31.8 ml 17.06 ml/m  AORTIC VALVE LVOT Vmax:   92.70 cm/s LVOT Vmean:  62.700 cm/s LVOT VTI:    0.177 m  AORTA Ao Root diam: 3.50 cm MITRAL VALVE MV Area (PHT): 3.83 cm    SHUNTS MV Decel Time: 198 msec    Systemic VTI:  0.18 m MV E velocity: 72.40 cm/s  Systemic Diam: 2.00 cm MV A velocity: 99.80 cm/s MV E/A ratio:  0.73 Riley Lam MD Electronically signed by Riley Lam MD Signature Date/Time: 03/28/2023/3:37:09 PM    Final    CARDIAC CATHETERIZATION  Result Date: 03/28/2023  Dist RCA lesion is 85% stenosed.   RPDA lesion is 40% stenosed.   Mid LAD lesion is 100% stenosed.   2nd RPL lesion is 20% stenosed.   Ramus lesion is 20% stenosed.   1st Diag lesion is 80% stenosed.   Non-stenotic Mid LAD to Dist LAD lesion.   A drug-eluting stent was successfully placed.   Post intervention, there is a 0% residual stenosis.   The left ventricular ejection fraction is 50-55% by visual estimate. Acute coronary syndrome secondary to total LAD occlusion after the first diagonal vessel with 80% stenosis in a small caliber first diagonal vessel proximal to the total occlusion. Mild 20% stenosis in the ramus intermediate vessel. Small normal left circumflex vessel. Large dominant RCA with mild luminal irregularity and focal 85% stenosis proximal to the PDA takeoff with 40% mid PDA stenosis and 20% PL stenosis. Mild LV dysfunction with mid distal anterolateral and apical hypocontractility.  EF estimate approximately 50%.  LVEDP 22 mm. Successful PCI to the total LAD occlusion with ultimate insertion of a 3.0 x 15 mm Onyx frontier DES stent postdilated to 3.25 mm with 100% occlusion being reduced to 0%.  TIMI 0 flow was improved to TIMI-3 flow.  There is evidence for significant systolic bridging of the mid LAD proximal to the mid diagonal vessel. RECOMMENDATION: DAPT with aspirin/Brilinta for minimum of 1 year.  Will hydrate postprocedure.  Plan for staged PCI to his distal RCA which is a  large-caliber vessel.  Medical therapy versus PCI to the diagonal vessel.   DG Chest 2 View  Result Date: 03/27/2023 CLINICAL DATA:  Chest pain. EXAM: CHEST - 2 VIEW COMPARISON:  05/25/2018 FINDINGS: Heart is normal in size. Stable mediastinal contours. Calcified mediastinal lymph nodes typical of prior granulomatous disease. Minor aortic atherosclerosis. The lungs are clear. Pulmonary vasculature is normal. No consolidation, pleural effusion, or pneumothorax. No acute osseous abnormalities are seen. IMPRESSION: 1. No acute chest findings. 2. Minor aortic atherosclerosis. Electronically Signed   By: Narda Rutherford M.D.   On: 03/27/2023 23:20    Cardiac Studies   TTE 4/16  1. No LV thrombus noted. Left ventricular ejection fraction, by  estimation, is 45 to 50%. The left ventricle has mildly decreased  function. The left ventricle demonstrates regional wall motion  abnormalities (see scoring diagram/findings for  description). Left ventricular diastolic parameters are consistent with  Grade I diastolic dysfunction (impaired relaxation).   2. Right ventricular systolic function is normal. The right ventricular  size is normal. Tricuspid regurgitation signal is inadequate for assessing  PA pressure.   3. The mitral valve is grossly normal. Trivial mitral valve  regurgitation.   4. The aortic valve was not well visualized. Aortic valve regurgitation  is trivial. No aortic stenosis is present.   5. The inferior vena cava is dilated in size with <50% respiratory  variability, suggesting right atrial pressure of 15 mmHg.   Cor angio/PCI 4/16 Acute coronary syndrome secondary to total LAD occlusion after the first diagonal vessel with 80% stenosis in a small caliber first diagonal vessel proximal to the total occlusion.   Mild 20% stenosis in the ramus intermediate vessel.   Small normal left circumflex vessel.   Large dominant RCA with mild luminal irregularity and focal 85% stenosis  proximal to the PDA takeoff with 40% mid PDA stenosis and 20% PL stenosis.   Mild LV dysfunction with mid distal anterolateral and apical hypocontractility.  EF estimate approximately 50%.  LVEDP 22 mm.  Patient Profile  72 y.o. male with a history of type 2 DM, hyperlipidemia, and traumatic SDH in 2013 who was seen for chest pain/NSTEMI.  Assessment & Plan    NSTEMI:  S/p PCI for occluded LAD; has residual distal RCA.  Cont DAPT, will change lopressor to Toprol 25 QPM given LV dysfunction, cont atorvastatin 80.  PCI RCA today.  Likely d/c in AM.  ICM:  Start Toprol 25 QPM, losartan 12.5mg  QPM, and jardiance 10mg   HTN:  BP well controlled and will optimize regimen for ICM  DM: ASA, statin, SSI, and start jardiance/losartan  I have reviewed the risks, indications, and alternatives to cardiac catheterization, possible angioplasty, and stenting with the patient. Risks include but are not limited to bleeding, infection, vascular injury, stroke, myocardial infection, arrhythmia, kidney injury, radiation-related injury in the case of prolonged fluoroscopy use, emergency cardiac surgery, and death. The patient understands the risks of serious complication is 1-2 in 1000 with diagnostic cardiac cath and 1-2% or less with angioplasty/stenting.    For questions or updates, please contact [facility] Please consult [email or web address] for contact info under        Signed, Orbie Pyo, MD  03/29/2023, 8:57 AM

## 2023-03-29 NOTE — Progress Notes (Signed)
PCP note  Visited patient at bedside. We had a good talk. He expressed appreciation for my visit.  Appreciate excellent care of FMTS and cardiology.

## 2023-03-29 NOTE — Progress Notes (Signed)
ANTICOAGULATION CONSULT NOTE - Follow Up Consult  Pharmacy Consult for heparin Indication:  CAD awaiting PCI  Labs: Recent Labs    03/27/23 2308 03/28/23 0105 03/28/23 0419 03/28/23 0703 03/28/23 0825 03/28/23 1734 03/29/23 0104  HGB 16.3  --  16.4  --   --   --  15.3  HCT 46.7  --  46.9  --   --   --  43.7  PLT 136*  --  131*  --   --   --  111*  HEPARINUNFRC  --   --   --   --  0.44 <0.10* 0.14*  CREATININE 1.09  --  1.05  --   --   --  1.05  TROPONINIHS 2,304* 5,049*  --  16,109* 60,454*  --   --     Assessment: 72yo male subtherapeutic on heparin after resuming post-cath; no infusion issues or signs of bleeding per RN.  Goal of Therapy:  Heparin level 0.3-0.7 units/ml   Plan:  Will increase heparin infusion by 2-3 units/kg/hr to 1100 units/hr and check level in 6 hours.    Vernard Gambles, PharmD, BCPS  03/29/2023,3:54 AM

## 2023-03-29 NOTE — Progress Notes (Addendum)
Patient started to develop a hematoma under the TR band while taking out 2 ccs of air. 2ccs replaced and the cath lab was called. Cath lab held pressure for 10 minutes and a manual BP cuff was placed on patients arm. Reverse test is a C with good profusion and a bounding radial pulse. 8cc band in TR band with hemostasis achieved.

## 2023-03-30 ENCOUNTER — Telehealth: Payer: Self-pay

## 2023-03-30 ENCOUNTER — Other Ambulatory Visit (HOSPITAL_COMMUNITY): Payer: Self-pay

## 2023-03-30 ENCOUNTER — Encounter (HOSPITAL_COMMUNITY): Payer: Self-pay | Admitting: Cardiology

## 2023-03-30 LAB — COMPREHENSIVE METABOLIC PANEL
ALT: 41 U/L (ref 0–44)
AST: 68 U/L — ABNORMAL HIGH (ref 15–41)
Albumin: 3.1 g/dL — ABNORMAL LOW (ref 3.5–5.0)
Alkaline Phosphatase: 50 U/L (ref 38–126)
Anion gap: 13 (ref 5–15)
BUN: 14 mg/dL (ref 8–23)
CO2: 18 mmol/L — ABNORMAL LOW (ref 22–32)
Calcium: 8.8 mg/dL — ABNORMAL LOW (ref 8.9–10.3)
Chloride: 107 mmol/L (ref 98–111)
Creatinine, Ser: 1.13 mg/dL (ref 0.61–1.24)
GFR, Estimated: >60 mL/min (ref >60)
Glucose, Bld: 103 mg/dL — ABNORMAL HIGH (ref 70–99)
Potassium: 4 mmol/L (ref 3.5–5.1)
Sodium: 138 mmol/L (ref 135–145)
Total Bilirubin: 1.4 mg/dL — ABNORMAL HIGH (ref 0.3–1.2)
Total Protein: 6.1 g/dL — ABNORMAL LOW (ref 6.5–8.1)

## 2023-03-30 LAB — GLUCOSE, CAPILLARY
Glucose-Capillary: 114 mg/dL — ABNORMAL HIGH (ref 70–99)
Glucose-Capillary: 72 mg/dL (ref 70–99)

## 2023-03-30 LAB — CBC
HCT: 44.4 % (ref 39.0–52.0)
Hemoglobin: 15.4 g/dL (ref 13.0–17.0)
MCH: 30.3 pg (ref 26.0–34.0)
MCHC: 34.7 g/dL (ref 30.0–36.0)
MCV: 87.2 fL (ref 80.0–100.0)
Platelets: 121 10*3/uL — ABNORMAL LOW (ref 150–400)
RBC: 5.09 MIL/uL (ref 4.22–5.81)
RDW: 13.1 % (ref 11.5–15.5)
WBC: 8.9 10*3/uL (ref 4.0–10.5)
nRBC: 0 % (ref 0.0–0.2)

## 2023-03-30 LAB — LIPOPROTEIN A (LPA): Lipoprotein (a): 21.9 nmol/L (ref <75.0)

## 2023-03-30 MED ORDER — CLOPIDOGREL BISULFATE 300 MG PO TABS
600.0000 mg | ORAL_TABLET | Freq: Once | ORAL | Status: AC
  Administered 2023-03-30: 600 mg via ORAL
  Filled 2023-03-30: qty 2

## 2023-03-30 MED ORDER — TAMSULOSIN HCL 0.4 MG PO CAPS
0.4000 mg | ORAL_CAPSULE | Freq: Every day | ORAL | 0 refills | Status: DC
  Filled 2023-03-30: qty 30, 30d supply, fill #0

## 2023-03-30 MED ORDER — APIXABAN 5 MG PO TABS
5.0000 mg | ORAL_TABLET | Freq: Two times a day (BID) | ORAL | Status: DC
  Administered 2023-03-30: 5 mg via ORAL
  Filled 2023-03-30: qty 1

## 2023-03-30 MED ORDER — METOPROLOL SUCCINATE ER 25 MG PO TB24
37.5000 mg | ORAL_TABLET | Freq: Every day | ORAL | 0 refills | Status: DC
  Filled 2023-03-30: qty 45, 30d supply, fill #0

## 2023-03-30 MED ORDER — EMPAGLIFLOZIN 10 MG PO TABS
10.0000 mg | ORAL_TABLET | Freq: Every day | ORAL | 0 refills | Status: DC
  Filled 2023-03-30: qty 30, 30d supply, fill #0

## 2023-03-30 MED ORDER — ACETAMINOPHEN 325 MG PO TABS: 650.0000 mg | ORAL_TABLET | ORAL | 1 refills | Status: AC | PRN

## 2023-03-30 MED ORDER — METOPROLOL SUCCINATE ER 25 MG PO TB24: 37.5000 mg | ORAL_TABLET | Freq: Every day | ORAL | Status: DC

## 2023-03-30 MED ORDER — ATORVASTATIN CALCIUM 80 MG PO TABS
80.0000 mg | ORAL_TABLET | Freq: Every day | ORAL | 1 refills | Status: DC
  Filled 2023-03-30: qty 30, 30d supply, fill #0

## 2023-03-30 MED ORDER — LOSARTAN POTASSIUM 25 MG PO TABS: 25.0000 mg | ORAL_TABLET | Freq: Every day | ORAL | Status: DC

## 2023-03-30 MED ORDER — APIXABAN 5 MG PO TABS
5.0000 mg | ORAL_TABLET | Freq: Two times a day (BID) | ORAL | 0 refills | Status: DC
  Filled 2023-03-30: qty 60, 30d supply, fill #0

## 2023-03-30 MED ORDER — CLOPIDOGREL BISULFATE 75 MG PO TABS: 75.0000 mg | ORAL_TABLET | Freq: Every day | ORAL | Status: DC

## 2023-03-30 MED ORDER — CLOPIDOGREL BISULFATE 75 MG PO TABS
75.0000 mg | ORAL_TABLET | Freq: Every day | ORAL | 0 refills | Status: DC
  Filled 2023-03-30: qty 30, 30d supply, fill #0

## 2023-03-30 MED ORDER — LOSARTAN POTASSIUM 25 MG PO TABS
25.0000 mg | ORAL_TABLET | Freq: Every day | ORAL | 0 refills | Status: DC
  Filled 2023-03-30: qty 30, 30d supply, fill #0

## 2023-03-30 MED ORDER — ASPIRIN 81 MG PO CHEW
81.0000 mg | CHEWABLE_TABLET | Freq: Every day | ORAL | 1 refills | Status: DC
  Filled 2023-03-30: qty 30, 30d supply, fill #0

## 2023-03-30 MED FILL — Nitroglycerin IV Soln 100 MCG/ML in D5W: INTRA_ARTERIAL | Qty: 10 | Status: AC

## 2023-03-30 NOTE — TOC Initial Note (Addendum)
Transition of Care [facility]) - Initial/Assessment Note    Patient Details  Name: Frederick Knight MRN: [id number] Date of Birth: 10-03-1951  Transition of Care [facility]) CM/SW Contact:    Elliot Cousin, RN Phone Number: 03/30/2023, 11:22 AM  Clinical Narrative:                  CM spoke to pt at bedside. Pt states he was independent PTA. He drives to his appts. Has DME in the home. PT did not recommend any HHPT. PCP appt was arranged with Dr Claudean Severance on 4/25. Appt scheduled by provider and is on AVS. Meds up from [facility] pharmacy.    Expected Discharge Plan: Home/Self Care Barriers to Discharge: No Barriers Identified   Patient Goals and CMS Choice Patient states their goals for this hospitalization and ongoing recovery are:: wants to get better     Expected Discharge Plan and Services   Discharge Planning Services: CM Consult   Living arrangements for the past 2 months: Single Family Home                      Prior Living Arrangements/Services Living arrangements for the past 2 months: Single Family Home Lives with:: Parents Patient language and need for interpreter reviewed:: Yes        Need for Family Participation in Patient Care: No (Comment) Care giver support system in place?: Yes (comment) Current home services: DME (rolling walker, cane) Criminal Activity/Legal Involvement Pertinent to Current Situation/Hospitalization: No - Comment as needed  Activities of Daily Living Home Assistive Devices/Equipment: None ADL Screening (condition at time of admission) Patient's cognitive ability adequate to safely complete daily activities?: Yes Is the patient deaf or have difficulty hearing?: Yes Does the patient have difficulty seeing, even when wearing glasses/contacts?: Yes Does the patient have difficulty concentrating, remembering, or making decisions?: Yes Patient able to express need for assistance with ADLs?: Yes Does the patient have difficulty dressing or bathing?:  Yes Independently performs ADLs?: Yes (appropriate for developmental age) Does the patient have difficulty walking or climbing stairs?: Yes Weakness of Legs: None Weakness of Arms/Hands: None  Permission Sought/Granted Permission sought to share information with : Case Manager, Family Supports, PCP Permission granted to share information with : Yes, Verbal Permission Granted  Share Information with NAME: Tyeson Tanimoto     Permission granted to share info w Relationship: daughter  Permission granted to share info w Contact Information: [phone]  Emotional Assessment Appearance:: Appears stated age Attitude/Demeanor/Rapport: Engaged Affect (typically observed): Accepting Orientation: : Oriented to Self, Oriented to Place, Oriented to  Time, Oriented to Situation   Psych Involvement: No (comment)  Admission diagnosis:  Thrombocytopenia [D69.6] Non-ST elevation MI (NSTEMI) [I21.4] Elevated AST (SGOT) [R74.01] Elevated random blood glucose level [R73.09] Chest pain in adult [R07.9] Patient Active Problem List   Diagnosis Date Noted   Ischemic cardiomyopathy 03/29/2023   NSTEMI (non-ST elevated myocardial infarction) 03/28/2023   T2DM (type 2 diabetes mellitus) 03/28/2023   Chest pain in adult 03/28/2023   Non-ST elevation MI (NSTEMI) 03/28/2023   Dysuria 06/09/2022   Skin neoplasm 04/14/2022   Urge incontinence 04/14/2022   Basal cell carcinoma (BCC) of occipital region of scalp 02/03/2022   Prediabetes 01/05/2022   Costochondritis, acute 06/07/2019   GERD (gastroesophageal reflux disease) 04/13/2017   Blurry vision, bilateral 04/13/2017   Solar lentigo 10/26/2016   Renal cyst, right 10/09/2016   Upper respiratory tract infection 03/13/2015   Seasonal allergies 03/13/2015   History  of colonic polyps    Abdominal pain 01/06/2015   Dizziness 08/28/2013   Seborrheic keratosis 06/04/2013   Hyperlipidemia 03/19/2013   BPH (benign prostatic hyperplasia) 01/18/2013   PCP:   Tiffany Kocher, DO Pharmacy:   [id number] - [place], [place] - [address] [place] [address] Phone: [phone] Fax: [phone]  [facility] [address] [place] [address] Phone: [phone] Fax: [phone]     Social Determinants of Health (SDOH) Social History: SDOH Screenings   Food Insecurity: No Food Insecurity (03/28/2023)  Housing: Low Risk  (03/28/2023)  Transportation Needs: No Transportation Needs (03/28/2023)  Utilities: Not At Risk (03/28/2023)  Depression (PHQ2-9): Low Risk  (12/15/2022)  Tobacco Use: Low Risk  (03/30/2023)   SDOH Interventions:     Readmission Risk Interventions     No data to display

## 2023-03-30 NOTE — Progress Notes (Signed)
Physical Therapy Treatment Patient Details Name: Frederick Knight MRN: [id number] DOB: 09-10-1951 Today's Date: 03/30/2023   History of Present Illness 72 y.o. male presents to [facility] hospital on 03/27/2023 with chest pain. Pt with elevated troponins. S/p 4/16 L heart cath and coronary angiography. 4/17 stent placement PMH includes DMII, HLD, traumatic SDH 2013.    PT Comments    Pt feeling well and ready to go home. Agreeable to walking with therapy. PT performed DGI and pt scoring 22/23 putting him at statistically low risk for falls. D/c plans remain appropriate.   Recommendations for follow up therapy are one component of a multi-disciplinary discharge planning process, led by the attending physician.  Recommendations may be updated based on patient status, additional functional criteria and insurance authorization.     Assistance Recommended at Discharge Intermittent Supervision/Assistance  Patient can return home with the following Other (comment) (family assistance for care of his mother)   Equipment Recommendations  None recommended by PT    Recommendations for Other Services OT consult     Precautions / Restrictions Precautions Precautions: None Restrictions Weight Bearing Restrictions: No     Mobility  Bed Mobility               General bed mobility comments: OOB in recliner    Transfers Overall transfer level: Needs assistance Equipment used: None Transfers: Sit to/from Stand Sit to Stand: Supervision           General transfer comment: supervision for safety    Ambulation/Gait Ambulation/Gait assistance: Supervision Gait Distance (Feet): 1000 Feet Assistive device: None Gait Pattern/deviations: Step-through pattern, WFL(Within Functional Limits) Gait velocity: slowed Gait velocity interpretation: >2.62 ft/sec, indicative of community ambulatory   General Gait Details: supervision for safety, mild instability       Balance                                  Standardized Balance Assessment Standardized Balance Assessment : Dynamic Gait Index   Dynamic Gait Index Level Surface: Normal Change in Gait Speed: Normal Gait with Horizontal Head Turns: Normal Gait with Vertical Head Turns: Normal Gait and Pivot Turn: Normal Step Over Obstacle: Mild Impairment Step Around Obstacles: Normal Steps: Normal Total Score: 23      Cognition Arousal/Alertness: Awake/alert Behavior During Therapy: WFL for tasks assessed/performed Overall Cognitive Status: Within Functional Limits for tasks assessed                                             General Comments General comments (skin integrity, edema, etc.): VSS on RA      Pertinent Vitals/Pain Pain Assessment Pain Assessment: No/denies pain     PT Goals (current goals can now be found in the care plan section) Acute Rehab PT Goals Patient Stated Goal: get back to teaching PT Goal Formulation: With patient Time For Goal Achievement: 04/12/23 Potential to Achieve Goals: Good Progress towards PT goals: Goals met/education completed, patient discharged from PT    Frequency    Min 1X/week      PT Plan Current plan remains appropriate       AM-PAC PT "6 Clicks" Mobility   Outcome Measure  Help needed turning from your back to your side while in a flat bed without using bedrails?: None Help needed moving from lying  on your back to sitting on the side of a flat bed without using bedrails?: None Help needed moving to and from a bed to a chair (including a wheelchair)?: None Help needed standing up from a chair using your arms (e.g., wheelchair or bedside chair)?: None Help needed to walk in hospital room?: None Help needed climbing 3-5 steps with a railing? : A Little 6 Click Score: 23    End of Session Equipment Utilized During Treatment: Gait belt Activity Tolerance: Patient tolerated treatment well Patient left: in chair;with call  bell/phone within reach Nurse Communication: Mobility status;Patient requests pain meds PT Visit Diagnosis: Unsteadiness on feet (R26.81);Muscle weakness (generalized) (M62.81);Difficulty in walking, not elsewhere classified (R26.2)     Time: 1238-1257 PT Time Calculation (min) (ACUTE ONLY): 19 min  [id number]:  $Gait Training: 8-22 mins                     Frederick Knight PT, DPT Acute Rehabilitation Services Please use secure chat or  Call Office [phone]    Frederick Knight [facility] 03/30/2023, 1:41 PM

## 2023-03-30 NOTE — Telephone Encounter (Signed)
**Note De-identified  Obfuscation** -----  **Note De-Identified  Obfuscation** Message from Arty Baumgartner, NP sent at 03/30/2023 11:10 AM EDT ----- Regarding: TOC call Needs TOC call please

## 2023-03-30 NOTE — Discharge Instructions (Addendum)
Dear Frederick Knight,  Thank you for letting us participate in your care. You were hospitalized for chest pain and diagnosed with a myocardial infarction, which are small clots in the arteries of your heart. You were treated with two drug eluting stents in your left anterior descending artery and right coronary artery.  POST-HOSPITAL & CARE INSTRUCTIONS Please take the following medications: Aspirin 81 mg once a day Plavix 75 mg once a day Eliquis 5 mg twice a day Atorvastatin 80 mg once a day Losartan 25 mg once a day Metoprolol succinate 37.5 mg every night before bed Farxiga 10 mg once a day Metformin 500 mg once a day Cardiology recommends you do not undergo screening colonoscopy for at least 6 months. Please call your GI at [phone] and reschedule your colonoscopy.  Go to your follow up appointments (listed below)  DOCTOR'S APPOINTMENT   Future Appointments  Date Time Provider Department Center  04/06/2023  2:45 PM Tiffany Kocher, [place] [facility]  04/10/2023  8:25 AM Sharlene Dory, PA-C [facility]  04/13/2023  2:30 PM Armbruster, Willaim Rayas, MD [facility]    Follow-up Information     Tiffany Kocher, DO. Go on 04/06/2023.   Specialty: Family Medicine Why: Please go to your appointment with Dr. Claudean Severance on 04/06/2023 at 2:45 pm. Contact information: [address] [place] [address] [phone]                 Take care and be well!  Family Medicine Teaching Service Inpatient Team [place]  [facility]  [address] [place], [place] [address] [phone]

## 2023-03-30 NOTE — Progress Notes (Addendum)
CARDIAC REHAB PHASE I   PRE:  Rate/Rhythm: 80 SR  BP:  Sitting: 126/74      SaO2: 98 RA  MODE:  Ambulation: 170 ft   POST:  Rate/Rhythm: 78 SR  BP:  Sitting: 129/67      SaO2: 98 RA   Pt ambulated in hall, tolerating well with no CP, dizziness or SOB. Returned to chair with bedside table and call bell in reach. Post MI/stent education including site care, restrictions, risk factors, MI booklet, exercise guidelines, antiplatelet therapy importance, heart healthy diabetic diet and CRP2 reviewed. All questions and concerns addressed. Will refer to [facility] for CRP2. Plan for possible discharge later today.   [id number] Woodroe Chen, RN BSN 03/30/2023 10:30 AM

## 2023-03-30 NOTE — Discharge Summary (Signed)
Family Medicine Teaching [facility] Discharge Summary  Patient name: Frederick Knight Medical record number: [id number] Date of birth: August 23, 1951 Age: 72 y.o. Gender: male Date of Admission: 03/27/2023  Date of Discharge: 03/30/2023 Admitting Physician: Tiffany Kocher, DO  Primary Care Provider: Tiffany Kocher, DO Consultants: Cardiology  Indication for Hospitalization: NSTEMI  Brief Hospital Course:  Frederick Knight is a 72 y.o.male with a history of T2DM, GERD, BPH who was admitted to the [facility] Family Medicine Teaching Service at [facility] for chest pain. His hospital course is detailed below:  Chest pain 2/2 NSTEMI Presented with acute onset chest pain. Hemodynamically stable on admission.  In ED, Labs significant for troponin 2,304 >>>5049, which trended as high as 17,821. Started on Heparin, nitro, aspirin and high intensity statin day of admission. 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 Underwent second LHC on 4/17 stent placed in RCA. Cardiology recommended medical management of diagonal lesion. Antiplatelet and anticoagulation plan: ASA + Plavix + Elquis for 1 month. Then Plavix and Eliquis alone until anterior akinesis improves. If AK improves, add back ASA and complete DAPT for 1 year. After 1 year of DAPT transition to Plavix monotherapy indefinitely. Lipid panel in goal, and continue atorvastatin 80 at discharge. Chest pain resolved and patient was discharged on 03/30/2023 in stable and good condition.  Ischemic cardiomyopathy  Echocardiogram obtained and showed LVEF 45-50%, LV mild decreased function with regional wall motion abnormalities and diastolic parameters consistent with grade 1 diastolic dysfunction. He was started on Toprol, Farxiga and Losartan for GDMT.  Elevated liver enzymes AST level of 119 on admission. Hepatitis panel negative. Repeat AST of 68. Suspect elevation 2/2 NSTEMI and  recommend repeat lab work with PCP.  Other chronic conditions were medically managed with home medications and formulary alternatives as necessary (T2DM, HLD)  PCP Follow-up Recommendations: ASA, Plavix, Eliquis x 1 month. Ensure follow-up with cardiology Recommend repeat hepatic function test Ensure patient has canceled colonoscopy. Cards recommends waiting 6 months.  Discharge Diagnoses/Problem List:  Principal Problem:   Chest pain in adult Active Problems:   NSTEMI (non-ST elevated myocardial infarction)   Hyperlipidemia   T2DM (type 2 diabetes mellitus)   Non-ST elevation MI (NSTEMI)   Ischemic cardiomyopathy  Disposition: Home  Discharge Condition: Stable  Discharge Exam:  General: NAD, pleasant, resting comfortably in bed Cardio: RRR, no MRG. Cap Refill <2s. Respiratory: CTAB, normal wob on RA GI: Abdomen is soft, not tender, not distended. BS present Skin: Warm and dry  Significant Procedures: LHC x2  Significant Labs and Imaging:  Recent Labs  Lab 03/29/23 0104 03/30/23 0427  WBC 10.3 8.9  HGB 15.3 15.4  HCT 43.7 44.4  PLT 111* 121*   Recent Labs  Lab 03/29/23 0104 03/30/23 0052  NA 136 138  K 3.7 4.0  CL 106 107  CO2 20* 18*  GLUCOSE 127* 103*  BUN 9 14  CREATININE 1.05 1.13  CALCIUM 8.8* 8.8*  ALKPHOS  --  50  AST  --  68*  ALT  --  41  ALBUMIN  --  3.1*    Discharge Medications:  Allergies as of 03/30/2023   No Known Allergies      Medication List     STOP taking these medications    oxybutynin 5 MG 24 hr tablet Commonly known as: DITROPAN-XL   rosuvastatin 10 MG tablet Commonly known as: CRESTOR  TAKE these medications    acetaminophen 325 MG tablet Commonly known as: TYLENOL Take 2 tablets (650 mg total) by mouth every 4 (four) hours as needed for headache or mild pain.   apixaban 5 MG Tabs tablet Commonly known as: ELIQUIS Take 1 tablet (5 mg total) by mouth 2 (two) times daily.   aspirin 81 MG chewable  tablet Chew 1 tablet (81 mg total) by mouth daily. Start taking on: March 31, 2023   atorvastatin 80 MG tablet Commonly known as: LIPITOR Take 1 tablet (80 mg total) by mouth daily. Start taking on: March 31, 2023   clopidogrel 75 MG tablet Commonly known as: PLAVIX Take 1 tablet (75 mg total) by mouth daily. Start taking on: March 31, 2023   empagliflozin 10 MG Tabs tablet Commonly known as: Jardiance Take 1 tablet (10 mg total) by mouth daily.   losartan 25 MG tablet Commonly known as: COZAAR Take 1 tablet (25 mg total) by mouth at bedtime.   metFORMIN 500 MG 24 hr tablet Commonly known as: GLUCOPHAGE-XR TAKE 1 TABLET BY MOUTH EVERY DAY WITH BREAKFAST What changed: See the new instructions.   metoprolol succinate 25 MG 24 hr tablet Commonly known as: TOPROL-XL Take 1.5 tablets (37.5 mg total) by mouth at bedtime.   tamsulosin 0.4 MG Caps capsule Commonly known as: FLOMAX Take 1 capsule (0.4 mg total) by mouth daily. What changed: how much to take        Discharge Instructions: Please refer to Patient Instructions section of EMR for full details.  Patient was counseled important signs and symptoms that should prompt return to medical care, changes in medications, dietary instructions, activity restrictions, and follow up appointments.   Follow-Up Appointments:  Follow-up Information     Tiffany Kocher, DO. Go on 04/06/2023.   Specialty: Family Medicine Why: Please go to your appointment with Dr. Claudean Severance on 04/06/2023 at 2:45 pm. Contact information: [address] [place] [address] [phone]                 Tiffany Kocher, DO 03/30/2023, 12:46 PM PGY-1, [facility]

## 2023-03-30 NOTE — Progress Notes (Addendum)
Rounding Note    Patient Name: Frederick Knight Date of Encounter: 03/30/2023  [facility] Cardiologist: Lynnette Caffey   Subjective   PCI of RCA yesterday without issues.  No complaints today.  Inpatient Medications    Scheduled Meds:  aspirin  81 mg Oral Daily   atorvastatin  80 mg Oral Daily   Chlorhexidine Gluconate Cloth  6 each Topical Daily   dapagliflozin propanediol  10 mg Oral Daily   enoxaparin (LOVENOX) injection  40 mg Subcutaneous Q24H   insulin aspart  0-6 Units Subcutaneous TID WC   losartan  12.5 mg Oral QHS   metoprolol succinate  25 mg Oral QHS   sodium chloride flush  3 mL Intravenous Q12H   sodium chloride flush  3 mL Intravenous Q12H   sodium chloride flush  3 mL Intravenous Q12H   sodium chloride flush  3 mL Intravenous Q12H   ticagrelor  90 mg Oral BID   Continuous Infusions:  sodium chloride 10 mL/hr at 03/30/23 0500   sodium chloride     PRN Meds:    Vital Signs    Vitals:   03/30/23 0400 03/30/23 0430 03/30/23 0500 03/30/23 0530  BP: 119/78 126/80 118/77 118/88  Pulse: 76 74 62 67  Resp: Temp: 97.9 F (36.6 C)     TempSrc: Oral     SpO2: 94% 97% 96% 96%  Weight:      Height:        Intake/Output Summary (Last 24 hours) at 03/30/2023 0623 Last data filed at 03/30/2023 0500 Gross per 24 hour  Intake 1997.7 ml  Output 1225 ml  Net 772.7 ml      03/27/2023   10:32 PM 03/23/2023    3:34 PM 12/15/2022    2:14 PM  Last 3 Weights  Weight (lbs) 165 lb 171 lb 161 lb  Weight (kg) 74.844 kg 77.565 kg 73.029 kg      Telemetry    Sinus Rhythm - Personally Reviewed  ECG    Sinus Rhythm evolving anterior MI pattern  Physical Exam   GEN: No acute distress.   Neck: No JVD Cardiac: RRR, no murmurs, rubs, or gallops.  Respiratory: Clear to auscultation bilaterally. GI: Soft, nontender, non-distended  MS: No edema; No deformity. Neuro:  Nonfocal  Psych: Normal affect  Vasc:  R radial intact  Labs    High  Sensitivity Troponin:   Recent Labs  Lab 03/27/23 2308 03/28/23 0105 03/28/23 0703 03/28/23 0825  TROPONINIHS 2,304* 5,049* 17,821* 18,512*     Chemistry Recent Labs  Lab 03/27/23 2308 03/28/23 0419 03/28/23 0825 03/29/23 0104 03/30/23 0052  NA 135 133*  --  136 138  K 3.8 3.8  --  3.7 4.0  CL 99 100  --  106 107  CO2 24 20*  --  20* 18*  GLUCOSE 139* 139*  --  127* 103*  BUN 12 9  --  9 14  CREATININE 1.09 1.05  --  1.05 1.13  CALCIUM 9.7 9.2  --  8.8* 8.8*  MG  --   --  2.1  --   --   PROT 7.3 6.9  --   --  6.1*  ALBUMIN 4.1 3.9  --   --  3.1*  AST 63* 119*  --   --  68*  ALT 39 46*  --   --  41  ALKPHOS 61 58  --   --  50  BILITOT 0.6  0.8  --   --  1.4*  GFRNONAA >60 >60  --  >60 >60  ANIONGAP 12 13  --  10 13    Lipids  Recent Labs  Lab 03/29/23 0104  CHOL 114  TRIG 79  HDL 45  LDLCALC 53  CHOLHDL 2.5    Hematology Recent Labs  Lab 03/28/23 0419 03/29/23 0104 03/30/23 0427  WBC 12.5* 10.3 8.9  RBC 5.25 4.96 5.09  HGB 16.4 15.3 15.4  HCT 46.9 43.7 44.4  MCV 89.3 88.1 87.2  MCH 31.2 30.8 30.3  MCHC 35.0 35.0 34.7  RDW 12.8 13.2 13.1  PLT 131* 111* 121*   Thyroid  Recent Labs  Lab 03/28/23 0419 03/28/23 0825  TSH 6.798*  --   FREET4  --  1.11    BNPNo results for input(s): "BNP", "PROBNP" in the last 168 hours.  DDimer No results for input(s): "DDIMER" in the last 168 hours.   Radiology    CARDIAC CATHETERIZATION  Result Date: 03/29/2023   Dist RCA lesion is 85% stenosed.   A drug-eluting stent was successfully placed using a SYNERGY XD 4.0X12.   Post intervention, there is a 0% residual stenosis. Successful PCI of the distal RCA with DES Plan; DAPT for one year. Anticipate DC tomorrow.   ECHOCARDIOGRAM COMPLETE  Result Date: 03/28/2023    ECHOCARDIOGRAM REPORT   Patient Name:   Frederick Knight Date of Exam: 03/28/2023 Medical Rec #:  [id number]        Height:       67.0 in Accession #:    [id number]       Weight:       165.0 lb Date of  Birth:  07-06-1951        BSA:          1.863 m Patient Age:    72 years         BP:           133/75 mmHg Patient Gender: M                HR:           76 bpm. Exam Location:  Inpatient Procedure: 2D Echo, Color Doppler, Cardiac Doppler and Intracardiac            Opacification Agent Indications:    Acute MI i49.1  History:        Patient has no prior history of Echocardiogram examinations.                 Risk Factors:Diabetes and Dyslipidemia.  Sonographer:    Irving Burton Senior RDCS Referring Phys: [phone] THOMAS A KELLY  Sonographer Comments: Poor parasternal window due to lung interference. IMPRESSIONS  1. No LV thrombus noted. Left ventricular ejection fraction, by estimation, is 45 to 50%. The left ventricle has mildly decreased function. The left ventricle demonstrates regional wall motion abnormalities (see scoring diagram/findings for description). Left ventricular diastolic parameters are consistent with Grade I diastolic dysfunction (impaired relaxation).  2. Right ventricular systolic function is normal. The right ventricular size is normal. Tricuspid regurgitation signal is inadequate for assessing PA pressure.  3. The mitral valve is grossly normal. Trivial mitral valve regurgitation.  4. The aortic valve was not well visualized. Aortic valve regurgitation is trivial. No aortic stenosis is present.  5. The inferior vena cava is dilated in size with <50% respiratory variability, suggesting right atrial pressure of 15 mmHg. Comparison(s): No prior Echocardiogram. FINDINGS  Left Ventricle: No  LV thrombus noted. Left ventricular ejection fraction, by estimation, is 45 to 50%. The left ventricle has mildly decreased function. The left ventricle demonstrates regional wall motion abnormalities. Definity contrast agent was given IV to delineate the left ventricular endocardial borders. The left ventricular internal cavity size was normal in size. There is no left ventricular hypertrophy. Left ventricular diastolic  parameters are consistent with Grade I diastolic dysfunction (impaired relaxation).  LV Wall Scoring: The apical septal segment and apical anterior segment are akinetic. The anterior septum, mid inferoseptal segment, and basal inferoseptal segment are hypokinetic. The anterior wall, entire lateral wall, and entire inferior wall are normal. Right Ventricle: The right ventricular size is normal. No increase in right ventricular wall thickness. Right ventricular systolic function is normal. Tricuspid regurgitation signal is inadequate for assessing PA pressure. Left Atrium: Left atrial size was normal in size. Right Atrium: Right atrial size was normal in size. Pericardium: There is no evidence of pericardial effusion. Mitral Valve: The mitral valve is grossly normal. Trivial mitral valve regurgitation. Tricuspid Valve: The tricuspid valve is grossly normal. Tricuspid valve regurgitation is trivial. Aortic Valve: The aortic valve was not well visualized. Aortic valve regurgitation is trivial. No aortic stenosis is present. Pulmonic Valve: The pulmonic valve was not well visualized. Pulmonic valve regurgitation is not visualized. Aorta: The ascending aorta was not well visualized and the aortic root is normal in size and structure. Venous: The inferior vena cava is dilated in size with less than 50% respiratory variability, suggesting right atrial pressure of 15 mmHg. IAS/Shunts: No atrial level shunt detected by color flow Doppler.  LEFT VENTRICLE PLAX 2D LVIDd:         3.70 cm     Diastology LVIDs:         2.70 cm     LV e' medial:    4.57 cm/s LV PW:         0.80 cm     LV E/e' medial:  15.8 LV IVS:        1.20 cm     LV e' lateral:   5.66 cm/s LVOT diam:     2.00 cm     LV E/e' lateral: 12.8 LV SV:         56 LV SV Index:   30 LVOT Area:     3.14 cm  LV Volumes (MOD) LV vol d, MOD A2C: 70.5 ml LV vol d, MOD A4C: 84.8 ml LV vol s, MOD A2C: 37.5 ml LV vol s, MOD A4C: 52.7 ml LV SV MOD A2C:     33.0 ml LV SV MOD A4C:      84.8 ml LV SV MOD BP:      31.9 ml RIGHT VENTRICLE RV S prime:     9.14 cm/s TAPSE (M-mode): 1.8 cm LEFT ATRIUM             Index        RIGHT ATRIUM           Index LA diam:        2.60 cm 1.40 cm/m   RA Area:     10.30 cm LA Vol (A2C):   30.9 ml 16.58 ml/m  RA Volume:   18.50 ml  9.93 ml/m LA Vol (A4C):   33.1 ml 17.76 ml/m LA Biplane Vol: 31.8 ml 17.06 ml/m  AORTIC VALVE LVOT Vmax:   92.70 cm/s LVOT Vmean:  62.700 cm/s LVOT VTI:    0.177 m  AORTA Ao Root diam:  3.50 cm MITRAL VALVE MV Area (PHT): 3.83 cm    SHUNTS MV Decel Time: 198 msec    Systemic VTI:  0.18 m MV E velocity: 72.40 cm/s  Systemic Diam: 2.00 cm MV A velocity: 99.80 cm/s MV E/A ratio:  0.73 Riley Lam MD Electronically signed by Riley Lam MD Signature Date/Time: 03/28/2023/3:37:09 PM    Final    CARDIAC CATHETERIZATION  Result Date: 03/28/2023   Dist RCA lesion is 85% stenosed.   RPDA lesion is 40% stenosed.   Mid LAD lesion is 100% stenosed.   2nd RPL lesion is 20% stenosed.   Ramus lesion is 20% stenosed.   1st Diag lesion is 80% stenosed.   Non-stenotic Mid LAD to Dist LAD lesion.   A drug-eluting stent was successfully placed.   Post intervention, there is a 0% residual stenosis.   The left ventricular ejection fraction is 50-55% by visual estimate. Acute coronary syndrome secondary to total LAD occlusion after the first diagonal vessel with 80% stenosis in a small caliber first diagonal vessel proximal to the total occlusion. Mild 20% stenosis in the ramus intermediate vessel. Small normal left circumflex vessel. Large dominant RCA with mild luminal irregularity and focal 85% stenosis proximal to the PDA takeoff with 40% mid PDA stenosis and 20% PL stenosis. Mild LV dysfunction with mid distal anterolateral and apical hypocontractility.  EF estimate approximately 50%.  LVEDP 22 mm. Successful PCI to the total LAD occlusion with ultimate insertion of a 3.0 x 15 mm Onyx frontier DES stent postdilated to 3.25 mm  with 100% occlusion being reduced to 0%.  TIMI 0 flow was improved to TIMI-3 flow.  There is evidence for significant systolic bridging of the mid LAD proximal to the mid diagonal vessel. RECOMMENDATION: DAPT with aspirin/Brilinta for minimum of 1 year.  Will hydrate postprocedure.  Plan for staged PCI to his distal RCA which is a large-caliber vessel.  Medical therapy versus PCI to the diagonal vessel.    Cardiac Studies   TTE 4/16  1. No LV thrombus noted. Left ventricular ejection fraction, by  estimation, is 45 to 50%. The left ventricle has mildly decreased  function. The left ventricle demonstrates regional wall motion  abnormalities (see scoring diagram/findings for  description). Left ventricular diastolic parameters are consistent with  Grade I diastolic dysfunction (impaired relaxation).   2. Right ventricular systolic function is normal. The right ventricular  size is normal. Tricuspid regurgitation signal is inadequate for assessing  PA pressure.   3. The mitral valve is grossly normal. Trivial mitral valve  regurgitation.   4. The aortic valve was not well visualized. Aortic valve regurgitation  is trivial. No aortic stenosis is present.   5. The inferior vena cava is dilated in size with <50% respiratory  variability, suggesting right atrial pressure of 15 mmHg.   Cor angio/PCI 4/16 Acute coronary syndrome secondary to total LAD occlusion after the first diagonal vessel with 80% stenosis in a small caliber first diagonal vessel proximal to the total occlusion.   Mild 20% stenosis in the ramus intermediate vessel.   Small normal left circumflex vessel.   Large dominant RCA with mild luminal irregularity and focal 85% stenosis proximal to the PDA takeoff with 40% mid PDA stenosis and 20% PL stenosis.   Mild LV dysfunction with mid distal anterolateral and apical hypocontractility.  EF estimate approximately 50%.  LVEDP 22 mm.  Patient Profile     72 y.o. male with a  history of type 2 DM,  hyperlipidemia, and traumatic SDH in 2013 who was seen for chest pain/NSTEMI.  Assessment & Plan    NSTEMI:  S/p PCI for occluded LAD; has residual distal RCA.  Cont DAPT, atorvastatin 80, Toprol (increase dose as below).  Given anterior AK, will change to plavix and start Eliquis.  Cont triple therapy x 1 month then Eliquis/plavix until anterior AK improves.  When (if) it does, add ASA back, cont DAPT x 1 year then plavix monotherapy indefinitely; d/c today with close cardiology follow up.  ICM:  Increase Toprol to 37.5 QPM and losartan to 25mg  QPM; cont Jardiance 10mg .    HTN:  BP and HR elevated this AM; increase Toprol and losartan as above  DM: ASA, statin, SSI, and start jardiance/losartan  History of traumatic SDH:  Over 11 years ago, ok for triple therapy as detailed above.  Dispo:  D/C today.   For questions or updates, please contact [facility] Please consult [email or web address] for contact info under        Signed, Orbie Pyo, MD  03/30/2023, 6:23 AM

## 2023-03-31 ENCOUNTER — Telehealth: Payer: Self-pay

## 2023-03-31 NOTE — Transitions of Care (Post Inpatient/ED Visit) (Signed)
   03/31/2023  Name: Frederick Knight MRN: [id number] DOB: 1951/10/10  Today's TOC FU Call Status: Today's TOC FU Call Status:: Unsuccessul Call (1st Attempt) Unsuccessful Call (1st Attempt) Date: 03/31/23  Attempted to reach the patient regarding the most recent Inpatient/ED visit.  Follow Up Plan: Additional outreach attempts will be made to reach the patient to complete the Transitions of Care (Post Inpatient/ED visit) call.   Jodelle Gross, RN, BSN, CCM Care Management Coordinator [place]/[facility] Phone: [phone]/Fax: [phone]

## 2023-03-31 NOTE — Telephone Encounter (Signed)
**Note De-Identified  Obfuscation** Transition Care Management Unsuccessful Follow-up Telephone Call  Date of discharge and from where:  03/30/2023 from [facility].  Attempts:  1st Attempt  Reason for unsuccessful TCM follow-up call:  I Left voice message asking the pt to call Larita Fife back at [facility] at [phone].

## 2023-04-03 ENCOUNTER — Telehealth: Payer: Self-pay

## 2023-04-03 ENCOUNTER — Telehealth: Payer: Self-pay | Admitting: Student

## 2023-04-03 NOTE — Telephone Encounter (Signed)
**Note De-Identified  Obfuscation** Transition Care Management Unsuccessful Follow-up Telephone Call  Date of discharge and from where:  03/30/2023  Attempts:  2nd Attempt  Reason for unsuccessful TCM follow-up call:  I Left voice message asking the pt to call Larita Fife back at [facility] at [phone].

## 2023-04-03 NOTE — Telephone Encounter (Signed)
Called patient, no answer.  Left HIPAA compliant voicemail.  Checking on patient's health, and he can give [place] a call back if he has questions.  We will follow-up with him later this week.

## 2023-04-03 NOTE — Transitions of Care (Post Inpatient/ED Visit) (Signed)
   04/03/2023  Name: Frederick Knight MRN: [id number] DOB: [birth year]  Today's TOC FU Call Status: Today's TOC FU Call Status:: Unsuccessful Call (2nd Attempt) Unsuccessful Call (2nd Attempt) Date: 04/03/23  Attempted to reach the patient regarding the most recent Inpatient/ED visit.  Follow Up Plan: Additional outreach attempts will be made to reach the patient to complete the Transitions of Care (Post Inpatient/ED visit) call.   Jodelle Gross, RN, BSN, CCM Care Management Coordinator [place] Phone: [phone]/Fax: [phone]

## 2023-04-04 ENCOUNTER — Telehealth: Payer: Self-pay

## 2023-04-04 NOTE — Telephone Encounter (Signed)
**Note De-Identified  Obfuscation** Transition Care Management Unsuccessful Follow-up Telephone Call  Date of discharge and from where:  03/30/2023 from [facility]  Attempts:  3rd Attempt  Reason for unsuccessful TCM follow-up call:  No answer so I Left voice message asking the pt to call Larita Fife back at [facility] at [phone] concerning his recent discharge from [facility].  I did leave a reminder in the VM message that he has a post hospital f/u scheduled with Jari Favre, PA-c on 04/10/2023 at 8:25 (I did advise that he should arrive 15 mins early at 8:10) at [facility] at [address] in [place], [place] [address].

## 2023-04-04 NOTE — Transitions of Care (Post Inpatient/ED Visit) (Signed)
   04/04/2023  Name: [name] MRN: [id number] DOB: [birth year]  Today's TOC FU Call Status: Today's TOC FU Call Status:: Unsuccessful Call (3rd Attempt) Unsuccessful Call (3rd Attempt) Date: 04/04/23  Attempted to reach the patient regarding the most recent Inpatient/ED visit.  Follow Up Plan: No further outreach attempts will be made at this time. We have been unable to contact the patient.  Jodelle Gross, RN, BSN, CCM Care Management Coordinator [place]/[facility]

## 2023-04-06 ENCOUNTER — Encounter: Payer: Self-pay | Admitting: Student

## 2023-04-06 ENCOUNTER — Ambulatory Visit (INDEPENDENT_AMBULATORY_CARE_PROVIDER_SITE_OTHER): Payer: Medicare Other | Admitting: Student

## 2023-04-06 VITALS — BP 102/60 | HR 93 | Ht 67.0 in | Wt 162.4 lb

## 2023-04-06 DIAGNOSIS — R42 Dizziness and giddiness: Secondary | ICD-10-CM | POA: Diagnosis not present

## 2023-04-06 DIAGNOSIS — I214 Non-ST elevation (NSTEMI) myocardial infarction: Secondary | ICD-10-CM

## 2023-04-06 DIAGNOSIS — R748 Abnormal levels of other serum enzymes: Secondary | ICD-10-CM | POA: Diagnosis not present

## 2023-04-06 DIAGNOSIS — I255 Ischemic cardiomyopathy: Secondary | ICD-10-CM

## 2023-04-06 NOTE — Assessment & Plan Note (Addendum)
Only 2 episodes since discharge. Occurs when standing out of bed in the morning only. Home pressure readings have been normotensive. Cap refill >3s on exam today. Suspected component of orthostatic hypotension in setting of dehydration. HR 93 with metoprolol, BP 116/62 on recheck in office and will not discontinue losartan at this time. No focal neurologic deficits on exam today. -Encouraged PO hydration -F/u with cardiology on Monday -Return precautions discussed

## 2023-04-06 NOTE — Progress Notes (Deleted)
    SUBJECTIVE:   CHIEF COMPLAINT / HPI:   Chest pain 2/2 NSTEMI Ischemic cardiomyopathy   Elevated liver enzymes    PERTINENT  PMH / PSH: ***  OBJECTIVE:   BP 102/60   Pulse 93   Ht  (1.702 m)   Wt 162 lb 6.4 oz (73.7 kg)   SpO2 98%   BMI 25.44 kg/m   ***  ASSESSMENT/PLAN:   No problem-specific Assessment & Plan notes found for this encounter.     Tiffany Kocher, DO [facility]

## 2023-04-06 NOTE — Assessment & Plan Note (Addendum)
Compliant with metoprolol jardiance and losartan. -Continue regimen -f/u with cardiology on Monday 4/29

## 2023-04-06 NOTE — Assessment & Plan Note (Addendum)
No chest pain. Back to baseline activity level. Compliant with medications. ASA, Plavix, Eliquis x 1 month. Will check Hgb as patient is on triple therapy and bruising to arm. -CBC -Return precautions discussed

## 2023-04-06 NOTE — Assessment & Plan Note (Signed)
Liver enzymes were elevated during hospitalization, suspect probably due to ischemia from NSTEMI. -CMP

## 2023-04-06 NOTE — Progress Notes (Signed)
    SUBJECTIVE:   CHIEF COMPLAINT / HPI:   Chest pain 2/2 NSTEMI Ischemic cardiomyopathy Dizziness No chest pain, compliant with medications.  At baseline function, however avoiding heavy lifting strenuous exercise.  He is excited for finals week, he is a professor at [employer]  He endorsed 1 episode of dizziness 3 days ago when getting out of bed in the morning, they went away after he sat down-no loss of consciousness no fall.  Small episode of dizziness this morning, which resolved with sitting.  No ataxia, no nausea or vomiting, no sick symptoms, no blurred vision, no other neurologic deficits.  Colonoscopy canceled per cardiology's recommendations.  Recommend waiting 6 months before colonoscopy.  Elevated liver enzymes  Patient is agreeable to laboratory work today.  Liver enzymes were elevated during hospitalization, suspect probably due to ischemia from NSTEMI.  PERTINENT  PMH / PSH: S/p 2 drug-eluting stents, NSTEMI, T2DM  OBJECTIVE:   BP 102/60   Pulse 93   Ht  (1.702 m)   Wt 162 lb 6.4 oz (73.7 kg)   SpO2 98%   BMI 25.44 kg/m   General: NAD, pleasant, well-appearing HEENT: Normocephalic, atraumatic head.  Normal conjunctiva. Cardio: RRR, no MRG. Cap Refill >3s Respiratory: CTAB, normal wob on RA GI: Abdomen is soft, not tender, not distended. BS present Skin: Warm and dry. Bruise on right arm (sight of cath). Neuro: CN II: PERRL CN III, IV,VI: EOMI CV V: Normal sensation in V1, V2, V3 CVII: Symmetric smile and brow raise CN VIII: Normal hearing CN IX,X: Symmetric palate raise  CN XI: 5/5 shoulder shrug CN XII: Symmetric tongue protrusion  UE and LE strength 5/5 2+ LE reflexes  Normal sensation in UE and LE bilaterally  No ataxia with finger to nose  ASSESSMENT/PLAN:   Dizziness Only 2 episodes since discharge. Occurs when standing out of bed in the morning only. Home pressure readings have been normotensive. Cap refill >3s on exam today. Suspected  component of orthostatic hypotension in setting of dehydration. HR 93 with metoprolol, BP 116/62 on recheck in office and will not discontinue losartan at this time. No focal neurologic deficits on exam today. -Encouraged PO hydration -F/u with cardiology on Monday -Return precautions discussed  NSTEMI (non-ST elevated myocardial infarction) No chest pain. Back to baseline activity level. Compliant with medications. ASA, Plavix, Eliquis x 1 month. Will check Hgb as patient is on triple therapy and bruising to arm. -CBC -Return precautions discussed  Elevated liver enzymes Liver enzymes were elevated during hospitalization, suspect probably due to ischemia from NSTEMI. -CMP  Ischemic cardiomyopathy Compliant with metoprolol jardiance and losartan. -Continue regimen -f/u with cardiology on Monday 4/29   Follow-up recommendations Ask about Medicare annual wellness visit Diabetic foot exam, urine ACR Tdap?  Tiffany Kocher, DO [facility]

## 2023-04-06 NOTE — Patient Instructions (Signed)
It was great to see you! Thank you for allowing me to participate in your care!   I recommend that you always bring your medications to each appointment as this makes it easy to ensure we are on the correct medications and helps [place] not miss when refills are needed.  Our plans for today:  - If you experience dizziness that does not resolve, or you lose consciousness, or fall please seek immediate care - If you experience chest pain or back pain that does not resolve with rest, please seek immediate care - Follow-up in 1 month to discuss blood pressure  We are checking some labs today, I will call you if they are abnormal will send you a MyChart message or a letter if they are normal.  If you do not hear about your labs in the next 2 weeks please let us know.  Take care and seek immediate care sooner if you develop any concerns. Please remember to show up 15 minutes before your scheduled appointment time!  Tiffany Kocher, DO [facility]

## 2023-04-07 ENCOUNTER — Encounter (HOSPITAL_COMMUNITY): Payer: Self-pay | Admitting: Cardiovascular Disease

## 2023-04-07 ENCOUNTER — Telehealth: Payer: Self-pay | Admitting: Student

## 2023-04-07 LAB — COMPREHENSIVE METABOLIC PANEL
ALT: 44 IU/L (ref 0–44)
AST: 28 IU/L (ref 0–40)
Albumin/Globulin Ratio: 1.6 (ref 1.2–2.2)
Albumin: 4.5 g/dL (ref 3.8–4.8)
Alkaline Phosphatase: 96 IU/L (ref 44–121)
BUN/Creatinine Ratio: 20 (ref 10–24)
BUN: 20 mg/dL (ref 8–27)
Bilirubin Total: 0.4 mg/dL (ref 0.0–1.2)
CO2: 22 mmol/L (ref 20–29)
Calcium: 9.9 mg/dL (ref 8.6–10.2)
Chloride: 102 mmol/L (ref 96–106)
Creatinine, Ser: 1 mg/dL (ref 0.76–1.27)
Globulin, Total: 2.9 g/dL (ref 1.5–4.5)
Glucose: 112 mg/dL — ABNORMAL HIGH (ref 70–99)
Potassium: 4 mmol/L (ref 3.5–5.2)
Sodium: 139 mmol/L (ref 134–144)
Total Protein: 7.4 g/dL (ref 6.0–8.5)
eGFR: 79 mL/min/{1.73_m2} (ref >59)

## 2023-04-07 LAB — CBC
Hematocrit: 46.8 % (ref 37.5–51.0)
Hemoglobin: 15.8 g/dL (ref 13.0–17.7)
MCH: 30.1 pg (ref 26.6–33.0)
MCHC: 33.8 g/dL (ref 31.5–35.7)
MCV: 89 fL (ref 79–97)
Platelets: 200 10*3/uL (ref 150–450)
RBC: 5.25 x10E6/uL (ref 4.14–5.80)
RDW: 12.5 % (ref 11.6–15.4)
WBC: 6.5 10*3/uL (ref 3.4–10.8)

## 2023-04-07 NOTE — Telephone Encounter (Signed)
Called x2 to check on patient. Updated his lab results via mychart, they are normal. Will call again at later date to check on patient.

## 2023-04-09 NOTE — Progress Notes (Unsigned)
Office Visit    Patient Name: Frederick Knight Date of Encounter: 04/10/2023  PCP:  Tiffany Kocher, DO   [facility]  Cardiologist:  Orbie Pyo, MD  Advanced Practice Provider:  No care team member to display Electrophysiologist:  None   HPI    Frederick Knight is a 72 y.o. male with a past medical history of type 2 diabetes mellitus, hyperlipidemia, GERD, BPH presents today for hospital follow-up.  Patient presented to the ED with acute onset of chest pain.  Hemodynamically stable on admission.  In the ED, labs significant for troponin 2304 >>> 5049, which trended as high as 17,821.  Started on heparin, nitro, aspirin and high intensity statin day of admission.  LHC on 4/16 showed 100% stenosis of mid LAD which was stented, 85% stenosis of RCA +80% stenosis of the diagonal artery.  Underwent second [facility] on 4/17 with stent placed to RCA.  Cardiology had recommended medical management of the diagonal lesion.  Antiplatelet and anticoagulation plan was aspirin plus Plavix plus Eliquis x 1 month then Plavix and Eliquis alone until anterior akinesis improves.  If AKI improves, add back aspirin and complete DAPT for 1 year.  After 1 year of DAPT transition to Plavix monotherapy indefinitely.  Lipid panel at goal and continue atorvastatin 80 mg at discharge.  Chest pain resolved patient was discharged 03/30/2023 in stable condition.  Patient also had a diagnosis of ischemic cardiomyopathy with LVEF 45 to 50%, LV mild decreased function with regional wall motion abnormalities and diastolic parameters consistent with grade 1 DD.  Started on metoprolol succinate, Farxiga, and losartan for GDMT.  Today, he tells me that he is doing well with his new medications.  Tolerating Eliquis 5 mg twice a day as well as Farxiga and metoprolol.  He is worried that his blood pressure has been low normal.  We discussed decreasing his losartan.  Otherwise plan for follow-up echocardiogram.   Some left arm pain but he thinks it could be musculoskeletal since he lays on that side at night.  We encourage participation in cardiac rehab and sent a referral today.  Reports no shortness of breath nor dyspnea on exertion. Reports no chest pain, pressure, or tightness. No edema, orthopnea, PND. Reports no palpitations.     Past Medical History    Past Medical History:  Diagnosis Date   Diabetes mellitus without complication (HCC)    Hyperlipemia    Controlled well with medications   Past Surgical History:  Procedure Laterality Date   COLONOSCOPY N/A 01/15/2015   RMR:  Multiple polyps removed as described above.   COLONOSCOPY  01/28/2021   [facility]le[facility] Patrick [facility]   COLONOSCOPY  2016   CORONARY STENT INTERVENTION N/A 03/29/2023   Procedure: CORONARY STENT INTERVENTION;  Surgeon: Swaz[place], Peter M, MD;  Location: [facility] INVASIVE CV LAB;  Service: Cardiovascular;  Laterality: N/A;   CORONARY STENT INTERVENTION N/A 03/28/2023   Procedure: CORONARY STENT INTERVENTION;  Surgeon: Lennette Bihari, MD;  Location: [facility] INVASIVE CV LAB;  Service: Cardiovascular;  Laterality: N/A;   CRANIOTOMY  05/16/2012   Procedure: CRANIOTOMY HEMATOMA EVACUATION SUBDURAL;  Surgeon: Mariam Dollar, MD;  Location: [facility] NEURO ORS;  Service: Neurosurgery;  Laterality: N/A;  Right burr holes for evacuation of subdural hematoma   CRANIOTOMY  05/18/2012   Procedure: CRANIOTOMY HEMATOMA EVACUATION SUBDURAL;  Surgeon: Mariam Dollar, MD;  Location: [facility] NEURO ORS;  Service: Neurosurgery;  Laterality: Right;   LEFT HEART CATH AND  CORONARY ANGIOGRAPHY N/A 03/28/2023   Procedure: LEFT HEART CATH AND CORONARY ANGIOGRAPHY;  Surgeon: Lennette Bihari, MD;  Location: [facility] INVASIVE CV LAB;  Service: Cardiovascular;  Laterality: N/A;    Allergies  No Known Allergies   EKGs/Labs/Other Studies Reviewed:   The following studies were reviewed today: Cardiac Studies & Procedures   CARDIAC CATHETERIZATION  CARDIAC CATHETERIZATION  03/29/2023  Narrative   Dist RCA lesion is 85% stenosed.   A drug-eluting stent was successfully placed using a SYNERGY XD 4.0X12.   Post intervention, there is a 0% residual stenosis.  Successful PCI of the distal RCA with DES  Plan; DAPT for one year. Anticipate DC tomorrow.  Findings Coronary Findings Diagnostic  Dominance: Right  Left Anterior Descending Non-stenotic Mid LAD lesion was previously treated. Non-stenotic Mid LAD to Dist LAD lesion.  First Diagonal Branch Vessel is small in size. 1st Diag lesion is 80% stenosed.  Second Diagonal Branch Vessel is small in size.  Ramus Intermedius Ramus lesion is 20% stenosed.  Left Circumflex Vessel is small.  Right Coronary Artery Vessel is large. The vessel exhibits minimal luminal irregularities. Dist RCA lesion is 85% stenosed.  Right Posterior Descending Artery RPDA lesion is 40% stenosed.  Second Right Posterolateral Branch 2nd RPL lesion is 20% stenosed.  Intervention  Dist RCA lesion Stent CATHETER LAUNCHER 6FR AL1 guide catheter was inserted. Lesion crossed with guidewire using a WIRE ASAHI PROWATER 180CM. Pre-stent angioplasty was performed using a BALLN EMERGE MR 2.5X12. A drug-eluting stent was successfully placed using a SYNERGY XD 4.0X12. Stent strut is well apposed. Post-stent angioplasty was performed using a BALL SAPPHIRE NC24 4.5X10. Maximum pressure:  16 atm. Post-Intervention Lesion Assessment The intervention was successful. Pre-interventional TIMI flow is 3. Post-intervention TIMI flow is 3. No complications occurred at this lesion. There is a 0% residual stenosis post intervention.   CARDIAC CATHETERIZATION 03/28/2023  Narrative   Dist RCA lesion is 85% stenosed.   RPDA lesion is 40% stenosed.   Mid LAD lesion is 100% stenosed.   2nd RPL lesion is 20% stenosed.   Ramus lesion is 20% stenosed.   1st Diag lesion is 80% stenosed.   Non-stenotic Mid LAD to Dist LAD lesion.   A drug-eluting  stent was successfully placed.   Post intervention, there is a 0% residual stenosis.   The left ventricular ejection fraction is 50-55% by visual estimate.  Acute coronary syndrome secondary to total LAD occlusion after the first diagonal vessel with 80% stenosis in a small caliber first diagonal vessel proximal to the total occlusion.  Mild 20% stenosis in the ramus intermediate vessel.  Small normal left circumflex vessel.  Large dominant RCA with mild luminal irregularity and focal 85% stenosis proximal to the PDA takeoff with 40% mid PDA stenosis and 20% PL stenosis.  Mild LV dysfunction with mid distal anterolateral and apical hypocontractility.  EF estimate approximately 50%.  LVEDP 22 mm.  Successful PCI to the total LAD occlusion with ultimate insertion of a 3.0 x 15 mm Onyx frontier DES stent postdilated to 3.25 mm with 100% occlusion being reduced to 0%.  TIMI 0 flow was improved to TIMI-3 flow.  There is evidence for significant systolic bridging of the mid LAD proximal to the mid diagonal vessel.  RECOMMENDATION: DAPT with aspirin/Brilinta for minimum of 1 year.  Will hydrate postprocedure.  Plan for staged PCI to his distal RCA which is a large-caliber vessel.  Medical therapy versus PCI to the diagonal vessel.  Findings Coronary Findings  Diagnostic  Dominance: Right  Left Anterior Descending Mid LAD lesion is 100% stenosed. Non-stenotic Mid LAD to Dist LAD lesion.  First Diagonal Branch Vessel is small in size. 1st Diag lesion is 80% stenosed.  Second Diagonal Branch Vessel is small in size.  Ramus Intermedius Ramus lesion is 20% stenosed.  Left Circumflex Vessel is small.  Right Coronary Artery Vessel is large. The vessel exhibits minimal luminal irregularities. Dist RCA lesion is 85% stenosed.  Right Posterior Descending Artery RPDA lesion is 40% stenosed.  Second Right Posterolateral Branch 2nd RPL lesion is 20% stenosed.  Intervention  Mid LAD  lesion Stent A drug-eluting stent was successfully placed. Stent strut is well apposed. Post-Intervention Lesion Assessment The intervention was successful. Pre-interventional TIMI flow is 0. Post-intervention TIMI flow is 3. No complications occurred at this lesion. There is a 0% residual stenosis post intervention.     ECHOCARDIOGRAM  ECHOCARDIOGRAM COMPLETE 03/28/2023  Narrative ECHOCARDIOGRAM REPORT    Patient Name:   Frederick Knight Date of Exam: 03/28/2023 Medical Rec #:  [id number]        Height:       67.0 in Accession #:    [id number]       Weight:       165.0 lb Date of Birth:  11-24-1951        BSA:          1.863 m Patient Age:    72 years         BP:           133/75 mmHg Patient Gender: M                HR:           76 bpm. Exam Location:  Inpatient  Procedure: 2D Echo, Color Doppler, Cardiac Doppler and Intracardiac Opacification Agent  Indications:    Acute MI i49.1  History:        Patient has no prior history of Echocardiogram examinations. Risk Factors:Diabetes and Dyslipidemia.  Sonographer:    Irving Burton Senior RDCS Referring Phys: [phone] THOMAS A KELLY   Sonographer Comments: Poor parasternal window due to lung interference. IMPRESSIONS   1. No LV thrombus noted. Left ventricular ejection fraction, by estimation, is 45 to 50%. The left ventricle has mildly decreased function. The left ventricle demonstrates regional wall motion abnormalities (see scoring diagram/findings for description). Left ventricular diastolic parameters are consistent with Grade I diastolic dysfunction (impaired relaxation). 2. Right ventricular systolic function is normal. The right ventricular size is normal. Tricuspid regurgitation signal is inadequate for assessing PA pressure. 3. The mitral valve is grossly normal. Trivial mitral valve regurgitation. 4. The aortic valve was not well visualized. Aortic valve regurgitation is trivial. No aortic stenosis is present. 5. The inferior  vena cava is dilated in size with <50% respiratory variability, suggesting right atrial pressure of 15 mmHg.  Comparison(s): No prior Echocardiogram.  FINDINGS Left Ventricle: No LV thrombus noted. Left ventricular ejection fraction, by estimation, is 45 to 50%. The left ventricle has mildly decreased function. The left ventricle demonstrates regional wall motion abnormalities. Definity contrast agent was given IV to delineate the left ventricular endocardial borders. The left ventricular internal cavity size was normal in size. There is no left ventricular hypertrophy. Left ventricular diastolic parameters are consistent with Grade I diastolic dysfunction (impaired relaxation).   LV Wall Scoring: The apical septal segment and apical anterior segment are akinetic. The anterior septum, mid inferoseptal segment, and basal inferoseptal segment are  hypokinetic. The anterior wall, entire lateral wall, and entire inferior wall are normal.  Right Ventricle: The right ventricular size is normal. No increase in right ventricular wall thickness. Right ventricular systolic function is normal. Tricuspid regurgitation signal is inadequate for assessing PA pressure.  Left Atrium: Left atrial size was normal in size.  Right Atrium: Right atrial size was normal in size.  Pericardium: There is no evidence of pericardial effusion.  Mitral Valve: The mitral valve is grossly normal. Trivial mitral valve regurgitation.  Tricuspid Valve: The tricuspid valve is grossly normal. Tricuspid valve regurgitation is trivial.  Aortic Valve: The aortic valve was not well visualized. Aortic valve regurgitation is trivial. No aortic stenosis is present.  Pulmonic Valve: The pulmonic valve was not well visualized. Pulmonic valve regurgitation is not visualized.  Aorta: The ascending aorta was not well visualized and the aortic root is normal in size and structure.  Venous: The inferior vena cava is dilated in size  with less than 50% respiratory variability, suggesting right atrial pressure of 15 mmHg.  IAS/Shunts: No atrial level shunt detected by color flow Doppler.   LEFT VENTRICLE PLAX 2D LVIDd:         3.70 cm     Diastology LVIDs:         2.70 cm     LV e' medial:    4.57 cm/s LV PW:         0.80 cm     LV E/e' medial:  15.8 LV IVS:        1.20 cm     LV e' lateral:   5.66 cm/s LVOT diam:     2.00 cm     LV E/e' lateral: 12.8 LV SV:         56 LV SV Index:   30 LVOT Area:     3.14 cm  LV Volumes (MOD) LV vol d, MOD A2C: 70.5 ml LV vol d, MOD A4C: 84.8 ml LV vol s, MOD A2C: 37.5 ml LV vol s, MOD A4C: 52.7 ml LV SV MOD A2C:     33.0 ml LV SV MOD A4C:     84.8 ml LV SV MOD BP:      31.9 ml  RIGHT VENTRICLE RV S prime:     9.14 cm/s TAPSE (M-mode): 1.8 cm  LEFT ATRIUM             Index        RIGHT ATRIUM           Index LA diam:        2.60 cm 1.40 cm/m   RA Area:     10.30 cm LA Vol (A2C):   30.9 ml 16.58 ml/m  RA Volume:   18.50 ml  9.93 ml/m LA Vol (A4C):   33.1 ml 17.76 ml/m LA Biplane Vol: 31.8 ml 17.06 ml/m AORTIC VALVE LVOT Vmax:   92.70 cm/s LVOT Vmean:  62.700 cm/s LVOT VTI:    0.177 m  AORTA Ao Root diam: 3.50 cm  MITRAL VALVE MV Area (PHT): 3.83 cm    SHUNTS MV Decel Time: 198 msec    Systemic VTI:  0.18 m MV E velocity: 72.40 cm/s  Systemic Diam: 2.00 cm MV A velocity: 99.80 cm/s MV E/A ratio:  0.73  Riley Lam MD Electronically signed by Riley Lam MD Signature Date/Time: 03/28/2023/3:37:09 PM    Final              EKG:  EKG is not  ordered today.   Recent Labs: 03/28/2023: Magnesium 2.1; TSH 6.798 04/06/2023: ALT 44; BUN 20; Creatinine, Ser 1.00; Hemoglobin 15.8; Platelets 200; Potassium 4.0; Sodium 139  Recent Lipid Panel    Component Value Date/Time   CHOL 114 03/29/2023 0104   CHOL 151 01/04/2022 1133   TRIG 79 03/29/2023 0104   HDL 45 03/29/2023 0104   HDL 44 01/04/2022 1133   CHOLHDL 2.5 03/29/2023 0104   VLDL  16 03/29/2023 0104   LDLCALC 53 03/29/2023 0104   LDLCALC 84 01/04/2022 1133    Home Medications   Current Meds  Medication Sig   acetaminophen (TYLENOL) 325 MG tablet Take 2 tablets (650 mg total) by mouth every 4 (four) hours as needed for headache or mild pain.   apixaban (ELIQUIS) 5 MG TABS tablet Take 1 tablet (5 mg total) by mouth 2 (two) times daily.   aspirin 81 MG chewable tablet Chew 1 tablet (81 mg total) by mouth daily.   atorvastatin (LIPITOR) 80 MG tablet Take 1 tablet (80 mg total) by mouth daily.   clopidogrel (PLAVIX) 75 MG tablet Take 1 tablet (75 mg total) by mouth daily.   empagliflozin (JARDIANCE) 10 MG TABS tablet Take 1 tablet (10 mg total) by mouth daily.   metFORMIN (GLUCOPHAGE-XR) 500 MG 24 hr tablet TAKE 1 TABLET BY MOUTH EVERY DAY WITH BREAKFAST (Patient taking differently: 500 mg daily with breakfast.)   metoprolol succinate (TOPROL-XL) 25 MG 24 hr tablet Take 1.5 tablets (37.5 mg total) by mouth at bedtime.   tamsulosin (FLOMAX) 0.4 MG CAPS capsule Take 1 capsule (0.4 mg total) by mouth daily.   [DISCONTINUED] losartan (COZAAR) 25 MG tablet Take 1 tablet (25 mg total) by mouth at bedtime.     Review of Systems      All other systems reviewed and are otherwise negative except as noted above.  Physical Exam    VS:  BP 102/60   Pulse 65   Ht 5\' 7"  (1.702 m)   Wt 162 lb (73.5 kg)   SpO2 98%   BMI 25.37 kg/m  , BMI Body mass index is 25.37 kg/m.  Wt Readings from Last 3 Encounters:  04/10/23 162 lb (73.5 kg)  04/06/23 162 lb 6.4 oz (73.7 kg)  03/27/23 165 lb (74.8 kg)     GEN: Well nourished, well developed, in no acute distress. HEENT: normal. Neck: Supple, no JVD, carotid bruits, or masses. Cardiac: RRR, no murmurs, rubs, or gallops. No clubbing, cyanosis, edema.  Radials/PT 2+ and equal bilaterally.  Respiratory:  Respirations regular and unlabored, clear to auscultation bilaterally. GI: Soft, nontender, nondistended. MS: No deformity or  atrophy. Skin: Warm and dry, no rash. Neuro:  Strength and sensation are intact. Psych: Normal affect. Some ecchymosis from cath site, no hematoma  Assessment & Plan    NSTEMI with PCI -No further chest pain -Continue current medication regimen which includes Eliquis 5 mg twice a day, aspirin 81 mg daily, Lipitor 80 mg daily, Plavix 75 mg daily, Jardiance 10 mg daily, losartan decreased to 12.5 mg daily, metoprolol succinate 37.5 mg daily -Triple therapy for 1 month and then just Eliquis and Plavix until echocardiogram results  Ischemic CM -Update echocardiogram, ordered today -Euvolemic on exam -Continue current medication regimen -If echocardiogram shows LVEF returned to normal we will plan to DC Eliquis and continue with DAPT (aspirin and Plavix) x 1 year   3. Hyperlipidemia -Most recent lipid panel showed LDL 53, at goal -Continue Lipitor 80 mg daily  Cardiac Rehabilitation Eligibility Assessment  The patient is ready to start cardiac rehabilitation from a cardiac standpoint.   Disposition: Follow up 3-4 months with Orbie Pyo, MD or APP.  Signed, Sharlene Dory, PA-C 04/10/2023, 6:06 PM [facility]

## 2023-04-10 ENCOUNTER — Ambulatory Visit: Payer: Medicare Other | Attending: Physician Assistant | Admitting: Physician Assistant

## 2023-04-10 ENCOUNTER — Encounter: Payer: Self-pay | Admitting: Physician Assistant

## 2023-04-10 VITALS — BP 102/60 | HR 65 | Ht 67.0 in | Wt 162.0 lb

## 2023-04-10 DIAGNOSIS — R748 Abnormal levels of other serum enzymes: Secondary | ICD-10-CM | POA: Diagnosis not present

## 2023-04-10 DIAGNOSIS — I214 Non-ST elevation (NSTEMI) myocardial infarction: Secondary | ICD-10-CM

## 2023-04-10 DIAGNOSIS — E785 Hyperlipidemia, unspecified: Secondary | ICD-10-CM | POA: Diagnosis not present

## 2023-04-10 DIAGNOSIS — I255 Ischemic cardiomyopathy: Secondary | ICD-10-CM | POA: Diagnosis not present

## 2023-04-10 MED ORDER — LOSARTAN POTASSIUM 25 MG PO TABS: 12.5000 mg | ORAL_TABLET | Freq: Every day | ORAL | 3 refills | Status: DC

## 2023-04-10 NOTE — Patient Instructions (Addendum)
Medication Instructions:   START TAKING: LOSARTAN 12.5 MG ONCE  A DAY  FOR ONE MONTH ONLY: TAKE ASPIRIN PLAVIX AND ELIQUIS   STOP ASPIRIN THEN RESUME TAKING :  PLAVIX AND ELIQUIS ONLY    *If you need a refill on your cardiac medications before your next appointment, please call your pharmacy*   Lab Work:  NONE ORDERED  TODAY    If you have labs (blood work) drawn today and your tests are completely normal, you will receive your results only by: MyChart Message (if you have MyChart) OR A paper copy in the mail If you have any lab test that is abnormal or we need to change your treatment, we will call you to review the results.   Testing/Procedures: Your physician has requested that you have an echocardiogram. Echocardiography is a painless test that uses sound waves to create images of your heart. It provides your doctor with information about the size and shape of your heart and how well your heart's chambers and valves are working. This procedure takes approximately one hour. There are no restrictions for this procedure. Please do NOT wear cologne, perfume, aftershave, or lotions (deodorant is allowed). Please arrive 15 minutes prior to your appointment time.       Follow-Up: At [facility], you and your health needs are our priority.  As part of our continuing mission to provide you with exceptional heart care, we have created designated Provider Care Teams.  These Care Teams include your primary Cardiologist (physician) and Advanced Practice Providers (APPs -  Physician Assistants and Nurse Practitioners) who all work together to provide you with the care you need, when you need it.  We recommend signing up for the patient portal called "MyChart".  Sign up information is provided on this After Visit Summary.  MyChart is used to connect with patients for Virtual Visits (Telemedicine).  Patients are able to view lab/test results, encounter notes, upcoming appointments,  etc.  Non-urgent messages can be sent to your provider as well.   To learn more about what you can do with MyChart, go to [email or web address].    Your next appointment:    3-4  month(s)  Provider:    Orbie Pyo, MD     Other Instructions   Heart-Healthy Eating Plan Eating a healthy diet is important for the health of your heart. A heart-healthy eating plan includes: Eating less unhealthy fats. Eating more healthy fats. Eating less salt in your food. Salt is also called sodium. Making other changes in your diet. Talk with your doctor or a diet specialist (dietitian) to create an eating plan that is right for you. What is my plan? Your doctor may recommend an eating plan that includes: Total fat: ______% or less of total calories a day. Saturated fat: ______% or less of total calories a day. Cholesterol: less than _________mg a day. Sodium: less than _________mg a day. What are tips for following this plan? Cooking Avoid frying your food. Try to bake, boil, grill, or broil it instead. You can also reduce fat by: Removing the skin from poultry. Removing all visible fats from meats. Steaming vegetables in water or broth. Meal planning  At meals, divide your plate into four equal parts: Fill one-half of your plate with vegetables and green salads. Fill one-fourth of your plate with whole grains. Fill one-fourth of your plate with lean protein foods. Eat 2-4 cups of vegetables per day. One cup of vegetables is: 1 cup (91 g) broccoli  or cauliflower florets. 2 medium carrots. 1 large bell pepper. 1 large sweet potato. 1 large tomato. 1 medium white potato. 2 cups (150 g) raw leafy greens. Eat 1-2 cups of fruit per day. One cup of fruit is: 1 small apple 1 large banana 1 cup (237 g) mixed fruit, 1 large orange,  cup (82 g) dried fruit, 1 cup (240 mL) 100% fruit juice. Eat more foods that have soluble fiber. These are apples, broccoli, carrots, beans, peas,  and barley. Try to get 20-30 g of fiber per day. Eat 4-5 servings of nuts, legumes, and seeds per week: 1 serving of dried beans or legumes equals  cup (90 g) cooked. 1 serving of nuts is  oz (12 almonds, 24 pistachios, or 7 walnut halves). 1 serving of seeds equals  oz (8 g). General information Eat more home-cooked food. Eat less restaurant, buffet, and fast food. Limit or avoid alcohol. Limit foods that are high in starch and sugar. Avoid fried foods. Lose weight if you are overweight. Keep track of how much salt (sodium) you eat. This is important if you have high blood pressure. Ask your doctor to tell you more about this. Try to add vegetarian meals each week. Fats Choose healthy fats. These include olive oil and canola oil, flaxseeds, walnuts, almonds, and seeds. Eat more omega-3 fats. These include salmon, mackerel, sardines, tuna, flaxseed oil, and ground flaxseeds. Try to eat fish at least 2 times each week. Check food labels. Avoid foods with trans fats or high amounts of saturated fat. Limit saturated fats. These are often found in animal products, such as meats, butter, and cream. These are also found in plant foods, such as palm oil, palm kernel oil, and coconut oil. Avoid foods with partially hydrogenated oils in them. These have trans fats. Examples are stick margarine, some tub margarines, cookies, crackers, and other baked goods. What foods should I eat? Fruits All fresh, canned (in natural juice), or frozen fruits. Vegetables Fresh or frozen vegetables (raw, steamed, roasted, or grilled). Green salads. Grains Most grains. Choose whole wheat and whole grains most of the time. Rice and pasta, including brown rice and pastas made with whole wheat. Meats and other proteins Lean, well-trimmed beef, veal, pork, and lamb. Chicken and [place] without skin. All fish and shellfish. Wild duck, rabbit, pheasant, and venison. Egg whites or low-cholesterol egg substitutes. Dried  beans, peas, lentils, and tofu. Seeds and most nuts. Dairy Low-fat or nonfat cheeses, including ricotta and mozzarella. Skim or 1% milk that is liquid, powdered, or evaporated. Buttermilk that is made with low-fat milk. Nonfat or low-fat yogurt. Fats and oils Non-hydrogenated (trans-free) margarines. Vegetable oils, including soybean, sesame, sunflower, olive, peanut, safflower, corn, canola, and cottonseed. Salad dressings or mayonnaise made with a vegetable oil. Beverages Mineral water. Coffee and tea. Diet carbonated beverages. Sweets and desserts Sherbet, gelatin, and fruit ice. Small amounts of dark chocolate. Limit all sweets and desserts. Seasonings and condiments All seasonings and condiments. The items listed above may not be a complete list of foods and drinks you can eat. Contact a dietitian for more options. What foods should I avoid? Fruits Canned fruit in heavy syrup. Fruit in cream or butter sauce. Fried fruit. Limit coconut. Vegetables Vegetables cooked in cheese, cream, or butter sauce. Fried vegetables. Grains Breads that are made with saturated or trans fats, oils, or whole milk. Croissants. Sweet rolls. Donuts. High-fat crackers, such as cheese crackers. Meats and other proteins Fatty meats, such as hot dogs, ribs,  sausage, bacon, rib-eye roast or steak. High-fat deli meats, such as salami and bologna. Caviar. Domestic duck and goose. Organ meats, such as liver. Dairy Cream, sour cream, cream cheese, and creamed cottage cheese. Whole-milk cheeses. Whole or 2% milk that is liquid, evaporated, or condensed. Whole buttermilk. Cream sauce or high-fat cheese sauce. Yogurt that is made from whole milk. Fats and oils Meat fat, or shortening. Cocoa butter, hydrogenated oils, palm oil, coconut oil, palm kernel oil. Solid fats and shortenings, including bacon fat, salt pork, lard, and butter. Nondairy cream substitutes. Salad dressings with cheese or sour cream. Beverages Regular  sodas and juice drinks with added sugar. Sweets and desserts Frosting. Pudding. Cookies. Cakes. Pies. Milk chocolate or white chocolate. Buttered syrups. Full-fat ice cream or ice cream drinks. The items listed above may not be a complete list of foods and drinks to avoid. Contact a dietitian for more information. Summary Heart-healthy meal planning includes eating less unhealthy fats, eating more healthy fats, and making other changes in your diet. Eat a balanced diet. This includes fruits and vegetables, low-fat or nonfat dairy, lean protein, nuts and legumes, whole grains, and heart-healthy oils and fats. This information is not intended to replace advice given to you by your health care provider. Make sure you discuss any questions you have with your health care provider. Document Revised: 01/03/2022 Document Reviewed: 01/03/2022 Elsevier Patient Education  2023 Elsevier Inc.  Low-Sodium Eating Plan Sodium, which is an element that makes up salt, helps you maintain a healthy balance of fluids in your body. Too much sodium can increase your blood pressure and cause fluid and waste to be held in your body. Your health care provider or dietitian may recommend following this plan if you have high blood pressure (hypertension), kidney disease, liver disease, or heart failure. Eating less sodium can help lower your blood pressure, reduce swelling, and protect your heart, liver, and kidneys. What are tips for following this plan? Reading food labels The Nutrition Facts label lists the amount of sodium in one serving of the food. If you eat more than one serving, you must multiply the listed amount of sodium by the number of servings. Choose foods with less than 140 mg of sodium per serving. Avoid foods with 300 mg of sodium or more per serving. Shopping  Look for lower-sodium products, often labeled as "low-sodium" or "no salt added." Always check the sodium content, even if foods are labeled as  "unsalted" or "no salt added." Buy fresh foods. Avoid canned foods and pre-made or frozen meals. Avoid canned, cured, or processed meats. Buy breads that have less than 80 mg of sodium per slice. Cooking  Eat more home-cooked food and less restaurant, buffet, and fast food. Avoid adding salt when cooking. Use salt-free seasonings or herbs instead of table salt or sea salt. Check with your health care provider or pharmacist before using salt substitutes. Cook with plant-based oils, such as canola, sunflower, or olive oil. Meal planning When eating at a restaurant, ask that your food be prepared with less salt or no salt, if possible. Avoid dishes labeled as brined, pickled, cured, smoked, or made with soy sauce, miso, or teriyaki sauce. Avoid foods that contain MSG (monosodium glutamate). MSG is sometimes added to [place] food, bouillon, and some canned foods. Make meals that can be grilled, baked, poached, roasted, or steamed. These are generally made with less sodium. General information Most people on this plan should limit their sodium intake to 1,500-2,000 mg (milligrams) of  sodium each day. What foods should I eat? Fruits Fresh, frozen, or canned fruit. Fruit juice. Vegetables Fresh or frozen vegetables. "No salt added" canned vegetables. "No salt added" tomato sauce and paste. Low-sodium or reduced-sodium tomato and vegetable juice. Grains Low-sodium cereals, including oats, puffed wheat and rice, and shredded wheat. Low-sodium crackers. Unsalted rice. Unsalted pasta. Low-sodium bread. Whole-grain breads and whole-grain pasta. Meats and other proteins Fresh or frozen (no salt added) meat, poultry, seafood, and fish. Low-sodium canned tuna and salmon. Unsalted nuts. Dried peas, beans, and lentils without added salt. Unsalted canned beans. Eggs. Unsalted nut butters. Dairy Milk. Soy milk. Cheese that is naturally low in sodium, such as ricotta cheese, fresh mozzarella, or Swiss cheese.  Low-sodium or reduced-sodium cheese. Cream cheese. Yogurt. Seasonings and condiments Fresh and dried herbs and spices. Salt-free seasonings. Low-sodium mustard and ketchup. Sodium-free salad dressing. Sodium-free light mayonnaise. Fresh or refrigerated horseradish. Lemon juice. Vinegar. Other foods Homemade, reduced-sodium, or low-sodium soups. Unsalted popcorn and pretzels. Low-salt or salt-free chips. The items listed above may not be a complete list of foods and beverages you can eat. Contact a dietitian for more information. What foods should I avoid? Vegetables Sauerkraut, pickled vegetables, and relishes. Olives. [place] fries. Onion rings. Regular canned vegetables (not low-sodium or reduced-sodium). Regular canned tomato sauce and paste (not low-sodium or reduced-sodium). Regular tomato and vegetable juice (not low-sodium or reduced-sodium). Frozen vegetables in sauces. Grains Instant hot cereals. Bread stuffing, pancake, and biscuit mixes. Croutons. Seasoned rice or pasta mixes. Noodle soup cups. Boxed or frozen macaroni and cheese. Regular salted crackers. Self-rising flour. Meats and other proteins Meat or fish that is salted, canned, smoked, spiced, or pickled. Precooked or cured meat, such as sausages or meat loaves. Frederick Knight. Ham. Pepperoni. Hot dogs. Corned beef. Chipped beef. Salt pork. Jerky. Pickled herring. Anchovies and sardines. Regular canned tuna. Salted nuts. Dairy Processed cheese and cheese spreads. Hard cheeses. Cheese curds. Blue cheese. Feta cheese. String cheese. Regular cottage cheese. Buttermilk. Canned milk. Fats and oils Salted butter. Regular margarine. Ghee. Bacon fat. Seasonings and condiments Onion salt, garlic salt, seasoned salt, table salt, and sea salt. Canned and packaged gravies. Worcestershire sauce. Tartar sauce. Barbecue sauce. Teriyaki sauce. Soy sauce, including reduced-sodium. Steak sauce. Fish sauce. Oyster sauce. Cocktail sauce. Horseradish that you  find on the shelf. Regular ketchup and mustard. Meat flavorings and tenderizers. Bouillon cubes. Hot sauce. Pre-made or packaged marinades. Pre-made or packaged taco seasonings. Relishes. Regular salad dressings. Salsa. Other foods Salted popcorn and pretzels. Corn chips and puffs. Potato and tortilla chips. Canned or dried soups. Pizza. Frozen entrees and pot pies. The items listed above may not be a complete list of foods and beverages you should avoid. Contact a dietitian for more information. Summary Eating less sodium can help lower your blood pressure, reduce swelling, and protect your heart, liver, and kidneys. Most people on this plan should limit their sodium intake to 1,500-2,000 mg (milligrams) of sodium each day. Canned, boxed, and frozen foods are high in sodium. Restaurant foods, fast foods, and pizza are also very high in sodium. You also get sodium by adding salt to food. Try to cook at home, eat more fresh fruits and vegetables, and eat less fast food and canned, processed, or prepared foods. This information is not intended to replace advice given to you by your health care provider. Make sure you discuss any questions you have with your health care provider. Document Revised: 11/04/2019 Document Reviewed: 10/30/2019 Elsevier Patient Education  2023 Elsevier  Inc.

## 2023-04-13 ENCOUNTER — Encounter: Payer: Medicare Other | Admitting: Gastroenterology

## 2023-04-18 ENCOUNTER — Telehealth (HOSPITAL_COMMUNITY): Payer: Self-pay

## 2023-04-18 ENCOUNTER — Encounter (HOSPITAL_COMMUNITY): Payer: Self-pay

## 2023-04-18 NOTE — Telephone Encounter (Signed)
Attempted to call patient in regards to Cardiac Rehab - LM on VM Mailed letter 

## 2023-04-21 ENCOUNTER — Other Ambulatory Visit: Payer: Self-pay

## 2023-04-21 ENCOUNTER — Telehealth: Payer: Self-pay | Admitting: *Deleted

## 2023-04-21 ENCOUNTER — Other Ambulatory Visit: Payer: Self-pay | Admitting: *Deleted

## 2023-04-21 MED ORDER — APIXABAN 5 MG PO TABS: 5.0000 mg | ORAL_TABLET | Freq: Two times a day (BID) | ORAL | 0 refills | Status: DC

## 2023-04-21 MED ORDER — EMPAGLIFLOZIN 10 MG PO TABS: 10.0000 mg | ORAL_TABLET | Freq: Every day | ORAL | 0 refills | Status: DC

## 2023-04-21 MED ORDER — METOPROLOL SUCCINATE ER 25 MG PO TB24: 37.5000 mg | ORAL_TABLET | Freq: Every day | ORAL | 3 refills | Status: DC

## 2023-04-21 MED ORDER — TAMSULOSIN HCL 0.4 MG PO CAPS: 0.4000 mg | ORAL_CAPSULE | Freq: Every day | ORAL | 0 refills | Status: DC

## 2023-04-21 MED ORDER — ATORVASTATIN CALCIUM 80 MG PO TABS: 80.0000 mg | ORAL_TABLET | Freq: Every day | ORAL | 1 refills | Status: DC

## 2023-04-21 MED ORDER — CLOPIDOGREL BISULFATE 75 MG PO TABS: 75.0000 mg | ORAL_TABLET | Freq: Every day | ORAL | 0 refills | Status: DC

## 2023-04-21 NOTE — Telephone Encounter (Signed)
Patient called the office requesting a refill on Eliquis 5mg . [facility], [address] [place]. Patient is scheduled for echo on May 28, 24. Per Tessa's epic note from 4/29, patient may be stopped on Eliquis 5mg  after testing results. Please only fill for 30 days until further notice. Thank you

## 2023-04-21 NOTE — Telephone Encounter (Signed)
Eliquis 5mg  refill request received. Patient is 72 years old, weight-73.5kg, Crea-1.00 on 04/06/23, Diagnosis-, and last seen by Jari Favre on 04/10/23. Dose is appropriate based on dosing criteria.   Per note: recommended medical management of the diagonal lesion. Antiplatelet and anticoagulation plan was aspirin plus Plavix plus Eliquis x 1 month then Plavix and Eliquis alone until anterior akinesis improves. If AKI improves, add back aspirin and complete DAPT for 1 year. After 1 year of DAPT transition to Plavix monotherapy indefinitely.

## 2023-04-24 ENCOUNTER — Other Ambulatory Visit: Payer: Self-pay | Admitting: Family Medicine

## 2023-04-26 ENCOUNTER — Telehealth (HOSPITAL_COMMUNITY): Payer: Self-pay

## 2023-04-26 NOTE — Telephone Encounter (Signed)
Pt insurance is active and benefits verified through [facility] Medicare Co-pay 0, DED 0/0 met, out of pocket $4,500/$680 met, co-insurance 0%. no pre-authorization required, [place]/[employer] 04/26/2023@8 :55, REF# [id number]  How many CR sessions are covered? (visits for ICR) no limit Is this a lifetime maximum or an annual maximum? annual Has the member used any of these services to date? no Is there a time limit (weeks/months) on start of program and/or program completion? no

## 2023-05-01 ENCOUNTER — Telehealth (HOSPITAL_COMMUNITY): Payer: Self-pay

## 2023-05-01 NOTE — Telephone Encounter (Signed)
LVM to call cardiac rehab at [phone].  

## 2023-05-04 ENCOUNTER — Encounter (HOSPITAL_COMMUNITY)
Admission: RE | Admit: 2023-05-04 | Discharge: 2023-05-04 | Disposition: A | Discharge status: Home / Self Care | Payer: Medicare Other | Source: Ambulatory Visit | Attending: Internal Medicine | Admitting: Internal Medicine

## 2023-05-04 VITALS — BP 122/68 | HR 96 | Ht 68.0 in | Wt 159.0 lb

## 2023-05-04 DIAGNOSIS — I214 Non-ST elevation (NSTEMI) myocardial infarction: Secondary | ICD-10-CM | POA: Insufficient documentation

## 2023-05-04 DIAGNOSIS — Z955 Presence of coronary angioplasty implant and graft: Secondary | ICD-10-CM | POA: Diagnosis not present

## 2023-05-04 LAB — GLUCOSE, CAPILLARY: Glucose-Capillary: 99 mg/dL (ref 70–99)

## 2023-05-04 NOTE — Progress Notes (Signed)
Cardiac Rehab Medication Review   Does the patient  feel that his/her medications are working for him/her?  yes  Has the patient been experiencing any side effects to the medications prescribed?  yes  Does the patient measure his/her own blood pressure or blood glucose at home?  yes   Does the patient have any problems obtaining medications due to transportation or finances?   no  Understanding of regimen: good Understanding of indications: good Potential of compliance: good    Comments: Pt checks BP 5x/week and blood sugar 1x/week. Pt not sure if one of the medications causes muscle pain, which is intermittent.     Lorin Picket 05/04/2023 1:34 PM

## 2023-05-04 NOTE — Progress Notes (Signed)
Cardiac Individual Treatment Plan  Patient Details  Name: Frederick Knight MRN: [id number] Date of Birth: 1950-12-30 Referring Provider:   Flowsheet Row INTENSIVE CARDIAC REHAB ORIENT from 05/04/2023 in [facility]  Referring Provider Alverda Skeans, MD       Initial Encounter Date:  Flowsheet Row INTENSIVE CARDIAC REHAB ORIENT from 05/04/2023 in [facility]  Date 05/04/23       Visit Diagnosis: 03/28/23 NSTEMI (non-ST elevated myocardial infarction) (HCC)  03/29/23 S/P drug eluting coronary stent placement, LAD/ RCA  Patient's Home Medications on Admission:  Current Outpatient Medications:    acetaminophen (TYLENOL) 325 MG tablet, Take 2 tablets (650 mg total) by mouth every 4 (four) hours as needed for headache or mild pain., Disp: 30 tablet, Rfl: 1   apixaban (ELIQUIS) 5 MG TABS tablet, Take 1 tablet (5 mg total) by mouth 2 (two) times daily., Disp: 60 tablet, Rfl: 0   atorvastatin (LIPITOR) 80 MG tablet, Take 1 tablet (80 mg total) by mouth daily., Disp: 90 tablet, Rfl: 1   clopidogrel (PLAVIX) 75 MG tablet, Take 1 tablet (75 mg total) by mouth daily., Disp: 30 tablet, Rfl: 0   empagliflozin (JARDIANCE) 10 MG TABS tablet, Take 1 tablet (10 mg total) by mouth daily., Disp: 30 tablet, Rfl: 0   losartan (COZAAR) 25 MG tablet, Take 0.5 tablets (12.5 mg total) by mouth at bedtime., Disp: 45 tablet, Rfl: 3   metFORMIN (GLUCOPHAGE-XR) 500 MG 24 hr tablet, TAKE 1 TABLET BY MOUTH EVERY DAY WITH BREAKFAST (Patient taking differently: 500 mg daily with breakfast.), Disp: 90 tablet, Rfl: 1   metoprolol succinate (TOPROL-XL) 25 MG 24 hr tablet, Take 1.5 tablets (37.5 mg total) by mouth at bedtime., Disp: 45 tablet, Rfl: 3   tamsulosin (FLOMAX) 0.4 MG CAPS capsule, Take 1 capsule (0.4 mg total) by mouth daily., Disp: 30 capsule, Rfl: 0   aspirin 81 MG chewable tablet, Chew 1 tablet  (81 mg total) by mouth daily. (Patient not taking: Reported on 05/04/2023), Disp: 30 tablet, Rfl: 1  Past Medical History: Past Medical History:  Diagnosis Date   Diabetes mellitus without complication (HCC)    Hyperlipemia    Controlled well with medications    Tobacco Use: Social History   Tobacco Use  Smoking Status Never  Smokeless Tobacco Never    Labs: Review Flowsheet  More data exists      Latest Ref Rng & Units 06/07/2019 12/18/2020 01/04/2022 03/28/2023 03/29/2023  Labs for ITP Cardiac and Pulmonary Rehab  Cholestrol 0 - 200 mg/dL - - 782  - 956   LDL (calc) 0 - 99 mg/dL - - 84  - 53   HDL-C >21 mg/dL - - 44  - 45   Trlycerides <150 mg/dL - - 308  - 79   Hemoglobin A1c 4.8 - 5.6 % 6.6  5.6  6.0  6.1  -    Capillary Blood Glucose: Lab Results  Component Value Date   GLUCAP 99 05/04/2023   GLUCAP 72 03/30/2023   GLUCAP 114 (H) 03/30/2023   GLUCAP 99 03/29/2023   GLUCAP 82 03/29/2023     Exercise Target Goals: Exercise Program Goal: Individual exercise prescription set using results from initial 6 min walk test and THRR while considering  patient's activity barriers and safety.   Exercise Prescription Goal: Initial exercise prescription builds to 30-45 minutes a day of aerobic activity, 2-3 days per  week.  Home exercise guidelines will be given to patient during program as part of exercise prescription that the participant will acknowledge.  Activity Barriers & Risk Stratification:  Activity Barriers & Cardiac Risk Stratification - 05/04/23 1511       Activity Barriers & Cardiac Risk Stratification   Activity Barriers Balance Concerns    Cardiac Risk Stratification High             6 Minute Walk:  6 Minute Walk     Row Name 05/04/23 1430         6 Minute Walk   Phase Initial     Distance 1177 feet     Walk Time 6 minutes     # of Rest Breaks 0     MPH 2.23     METS 2.73     RPE 8     Perceived Dyspnea  0     VO2 Peak 9.6     Symptoms No      Resting HR 95 bpm     Resting BP 122/68     Resting Oxygen Saturation  96 %     Exercise Oxygen Saturation  during 6 min walk 96 %     Max Ex. HR 105 bpm     Max Ex. BP 124/60     2 Minute Post BP 108/60              Oxygen Initial Assessment:   Oxygen Re-Evaluation:   Oxygen Discharge (Final Oxygen Re-Evaluation):   Initial Exercise Prescription:  Initial Exercise Prescription - 05/04/23 1500       Date of Initial Exercise RX and Referring Provider   Date 05/04/23    Referring Provider Alverda Skeans, MD    Expected Discharge Date 07/14/23      NuStep   Level 2    SPM 75    Minutes 15    METs 2.7      Arm Ergometer   Level 1    Watts 25    RPM 60    Minutes 15    METs 2.7      Prescription Details   Frequency (times per week) 3    Duration Progress to 30 minutes of continuous aerobic without signs/symptoms of physical distress      Intensity   THRR 40-80% of Max Heartrate 59-118    Ratings of Perceived Exertion 11-13    Perceived Dyspnea 0-4      Progression   Progression Continue progressive overload as per policy without signs/symptoms or physical distress.      Resistance Training   Training Prescription Yes    Weight 3 lbs    Reps 10-15             Perform Capillary Blood Glucose checks as needed.  Exercise Prescription Changes:   Exercise Comments:   Exercise Goals and Review:   Exercise Goals     Row Name 05/04/23 1511             Exercise Goals   Increase Physical Activity Yes       Intervention Provide advice, education, support and counseling about physical activity/exercise needs.;Develop an individualized exercise prescription for aerobic and resistive training based on initial evaluation findings, risk stratification, comorbidities and participant's personal goals.       Expected Outcomes Short Term: Attend rehab on a regular basis to increase amount of physical activity.;Long Term: Add in home exercise to make  exercise part of routine  and to increase amount of physical activity.;Long Term: Exercising regularly at least 3-5 days a week.       Increase Strength and Stamina Yes       Intervention Provide advice, education, support and counseling about physical activity/exercise needs.;Develop an individualized exercise prescription for aerobic and resistive training based on initial evaluation findings, risk stratification, comorbidities and participant's personal goals.       Expected Outcomes Short Term: Increase workloads from initial exercise prescription for resistance, speed, and METs.;Short Term: Perform resistance training exercises routinely during rehab and add in resistance training at home;Long Term: Improve cardiorespiratory fitness, muscular endurance and strength as measured by increased METs and functional capacity ( )       Able to understand and use rate of perceived exertion (RPE) scale Yes       Intervention Provide education and explanation on how to use RPE scale       Expected Outcomes Short Term: Able to use RPE daily in rehab to express subjective intensity level;Long Term:  Able to use RPE to guide intensity level when exercising independently       Knowledge and understanding of Target Heart Rate Range (THRR) Yes       Intervention Provide education and explanation of THRR including how the numbers were predicted and where they are located for reference       Expected Outcomes Short Term: Able to state/look up THRR;Long Term: Able to use THRR to govern intensity when exercising independently;Short Term: Able to use daily as guideline for intensity in rehab       Understanding of Exercise Prescription Yes       Intervention Provide education, explanation, and written materials on patient's individual exercise prescription       Expected Outcomes Short Term: Able to explain program exercise prescription;Long Term: Able to explain home exercise prescription to exercise independently                 Exercise Goals Re-Evaluation :   Discharge Exercise Prescription (Final Exercise Prescription Changes):   Nutrition:  Target Goals: Understanding of nutrition guidelines, daily intake of sodium 1500mg , cholesterol 200mg , calories 30% from fat and 7% or less from saturated fats, daily to have 5 or more servings of fruits and vegetables.  Biometrics:  Pre Biometrics - 05/04/23 1355       Pre Biometrics   Waist Circumference 38 inches    Hip Circumference 39 inches    Waist to Hip Ratio 0.97 %    Triceps Skinfold 9 mm    Grip Strength 32 kg    Flexibility 14 in    Single Leg Stand 6.08 seconds             Post Biometrics - 05/04/23 1355        Post  Biometrics   % Body Fat 23.5 %             Nutrition Therapy Plan and Nutrition Goals:   Nutrition Assessments:  MEDIFICTS Score Key: ?70 Need to make dietary changes  40-70 Heart Healthy Diet ? 40 Therapeutic Level Cholesterol Diet    Picture Your Plate Scores: <16 Unhealthy dietary pattern with much room for improvement. 41-50 Dietary pattern unlikely to meet recommendations for good health and room for improvement. 51-60 More healthful dietary pattern, with some room for improvement.  >60 Healthy dietary pattern, although there may be some specific behaviors that could be improved.    Nutrition Goals Re-Evaluation:   Nutrition Goals Re-Evaluation:  Nutrition Goals Discharge (Final Nutrition Goals Re-Evaluation):   Psychosocial: Target Goals: Acknowledge presence or absence of significant depression and/or stress, maximize coping skills, provide positive support system. Participant is able to verbalize types and ability to use techniques and skills needed for reducing stress and depression.  Initial Review & Psychosocial Screening:  Initial Psych Review & Screening - 05/04/23 1506       Initial Review   Current issues with None Identified      Family Dynamics   Good Support  System? Yes   Has mother, sister and daughter for support     Barriers   Psychosocial barriers to participate in program There are no identifiable barriers or psychosocial needs.      Screening Interventions   Interventions Encouraged to exercise             Quality of Life Scores:  Quality of Life - 05/04/23 1532       Quality of Life   Select Quality of Life      Quality of Life Scores   Health/Function Pre 28.77 %    Socioeconomic Pre 29.06 %    Psych/Spiritual Pre 30 %    Family Pre 27.6 %    GLOBAL Pre 28.91 %            Scores of 19 and below usually indicate a poorer quality of life in these areas.  A difference of  2-3 points is a clinically meaningful difference.  A difference of 2-3 points in the total score of the Quality of Life Index has been associated with significant improvement in overall quality of life, self-image, physical symptoms, and general health in studies assessing change in quality of life.  PHQ-9: Review Flowsheet  More data exists      05/04/2023 04/06/2023 12/15/2022 04/14/2022 02/24/2022  Depression screen PHQ 2/9  Decreased Interest 0 0 0 0 0  Down, Depressed, Hopeless 0 0 0 0 0  PHQ - 2 Score 0 0 0 0 0  Altered sleeping 0 2 0 0 0  Tired, decreased energy 1 2 0 0 0  Change in appetite 0 0 0 0 0  Feeling bad or failure about yourself  0 0 0 0 0  Trouble concentrating 0 0 0 0 0  Moving slowly or fidgety/restless 0 0 0 0 0  Suicidal thoughts 0 0 0 0 0  PHQ-9 Score 1 4 0 0 0  Difficult doing work/chores Not difficult at all Somewhat difficult Not difficult at all Not difficult at all -   Interpretation of Total Score  Total Score Depression Severity:  1-4 = Minimal depression, 5-9 = Mild depression, 10-14 = Moderate depression, 15-19 = Moderately severe depression, 20-27 = Severe depression   Psychosocial Evaluation and Intervention:   Psychosocial Re-Evaluation:   Psychosocial Discharge (Final Psychosocial  Re-Evaluation):   Vocational Rehabilitation: Provide vocational rehab assistance to qualifying candidates.   Vocational Rehab Evaluation & Intervention:  Vocational Rehab - 05/04/23 1506       Initial Vocational Rehab Evaluation & Intervention   Assessment shows need for Vocational Rehabilitation No   No needs, pt back to work as an Runner, broadcasting/film/video            Education: Education Goals: Education classes will be provided on a weekly basis, covering required topics. Participant will state understanding/return demonstration of topics presented.     Core Videos: Exercise    Move It!  Clinical staff conducted group or individual video education with verbal  and written material and guidebook.  Patient learns the recommended Pritikin exercise program. Exercise with the goal of living a long, healthy life. Some of the health benefits of exercise include controlled diabetes, healthier blood pressure levels, improved cholesterol levels, improved heart and lung capacity, improved sleep, and better body composition. Everyone should speak with their doctor before starting or changing an exercise routine.  Biomechanical Limitations Clinical staff conducted group or individual video education with verbal and written material and guidebook.  Patient learns how biomechanical limitations can impact exercise and how we can mitigate and possibly overcome limitations to have an impactful and balanced exercise routine.  Body Composition Clinical staff conducted group or individual video education with verbal and written material and guidebook.  Patient learns that body composition (ratio of muscle mass to fat mass) is a key component to assessing overall fitness, rather than body weight alone. Increased fat mass, especially visceral belly fat, can put [place] at increased risk for metabolic syndrome, type 2 diabetes, heart disease, and even death. It is recommended to combine diet and exercise (cardiovascular and  resistance training) to improve your body composition. Seek guidance from your physician and exercise physiologist before implementing an exercise routine.  Exercise Action Plan Clinical staff conducted group or individual video education with verbal and written material and guidebook.  Patient learns the recommended strategies to achieve and enjoy long-term exercise adherence, including variety, self-motivation, self-efficacy, and positive decision making. Benefits of exercise include fitness, good health, weight management, more energy, better sleep, less stress, and overall well-being.  Medical   Heart Disease Risk Reduction Clinical staff conducted group or individual video education with verbal and written material and guidebook.  Patient learns our heart is our most vital organ as it circulates oxygen, nutrients, white blood cells, and hormones throughout the entire body, and carries waste away. Data supports a plant-based eating plan like the Pritikin Program for its effectiveness in slowing progression of and reversing heart disease. The video provides a number of recommendations to address heart disease.   Metabolic Syndrome and Belly Fat  Clinical staff conducted group or individual video education with verbal and written material and guidebook.  Patient learns what metabolic syndrome is, how it leads to heart disease, and how one can reverse it and keep it from coming back. You have metabolic syndrome if you have 3 of the following 5 criteria: abdominal obesity, high blood pressure, high triglycerides, low HDL cholesterol, and high blood sugar.  Hypertension and Heart Disease Clinical staff conducted group or individual video education with verbal and written material and guidebook.  Patient learns that high blood pressure, or hypertension, is very common in the [place]. Hypertension is largely due to excessive salt intake, but other important risk factors include being overweight,  physical inactivity, drinking too much alcohol, smoking, and not eating enough potassium from fruits and vegetables. High blood pressure is a leading risk factor for heart attack, stroke, congestive heart failure, dementia, kidney failure, and premature death. Long-term effects of excessive salt intake include stiffening of the arteries and thickening of heart muscle and organ damage. Recommendations include ways to reduce hypertension and the risk of heart disease.  Diseases of Our Time - Focusing on Diabetes Clinical staff conducted group or individual video education with verbal and written material and guidebook.  Patient learns why the best way to stop diseases of our time is prevention, through food and other lifestyle changes. Medicine (such as prescription pills and surgeries) is often only a  Band-Aid on the problem, not a long-term solution. Most common diseases of our time include obesity, type 2 diabetes, hypertension, heart disease, and cancer. The Pritikin Program is recommended and has been proven to help reduce, reverse, and/or prevent the damaging effects of metabolic syndrome.  Nutrition   Overview of the Pritikin Eating Plan  Clinical staff conducted group or individual video education with verbal and written material and guidebook.  Patient learns about the Pritikin Eating Plan for disease risk reduction. The Pritikin Eating Plan emphasizes a wide variety of unrefined, minimally-processed carbohydrates, like fruits, vegetables, whole grains, and legumes. Go, Caution, and Stop food choices are explained. Plant-based and lean animal proteins are emphasized. Rationale provided for low sodium intake for blood pressure control, low added sugars for blood sugar stabilization, and low added fats and oils for coronary artery disease risk reduction and weight management.  Calorie Density  Clinical staff conducted group or individual video education with verbal and written material and  guidebook.  Patient learns about calorie density and how it impacts the Pritikin Eating Plan. Knowing the characteristics of the food you choose will help you decide whether those foods will lead to weight gain or weight loss, and whether you want to consume more or less of them. Weight loss is usually a side effect of the Pritikin Eating Plan because of its focus on low calorie-dense foods.  Label Reading  Clinical staff conducted group or individual video education with verbal and written material and guidebook.  Patient learns about the Pritikin recommended label reading guidelines and corresponding recommendations regarding calorie density, added sugars, sodium content, and whole grains.  Dining Out - Part 1  Clinical staff conducted group or individual video education with verbal and written material and guidebook.  Patient learns that restaurant meals can be sabotaging because they can be so high in calories, fat, sodium, and/or sugar. Patient learns recommended strategies on how to positively address this and avoid unhealthy pitfalls.  Facts on Fats  Clinical staff conducted group or individual video education with verbal and written material and guidebook.  Patient learns that lifestyle modifications can be just as effective, if not more so, as many medications for lowering your risk of heart disease. A Pritikin lifestyle can help to reduce your risk of inflammation and atherosclerosis (cholesterol build-up, or plaque, in the artery walls). Lifestyle interventions such as dietary choices and physical activity address the cause of atherosclerosis. A review of the types of fats and their impact on blood cholesterol levels, along with dietary recommendations to reduce fat intake is also included.  Nutrition Action Plan  Clinical staff conducted group or individual video education with verbal and written material and guidebook.  Patient learns how to incorporate Pritikin recommendations into  their lifestyle. Recommendations include planning and keeping personal health goals in mind as an important part of their success.  Healthy Mind-Set    Healthy Minds, Bodies, Hearts  Clinical staff conducted group or individual video education with verbal and written material and guidebook.  Patient learns how to identify when they are stressed. Video will discuss the impact of that stress, as well as the many benefits of stress management. Patient will also be introduced to stress management techniques. The way we think, act, and feel has an impact on our hearts.  How Our Thoughts Can Heal Our Hearts  Clinical staff conducted group or individual video education with verbal and written material and guidebook.  Patient learns that negative thoughts can cause depression and anxiety.  This can result in negative lifestyle behavior and serious health problems. Cognitive behavioral therapy is an effective method to help control our thoughts in order to change and improve our emotional outlook.  Additional Videos:  Exercise    Improving Performance  Clinical staff conducted group or individual video education with verbal and written material and guidebook.  Patient learns to use a non-linear approach by alternating intensity levels and lengths of time spent exercising to help burn more calories and lose more body fat. Cardiovascular exercise helps improve heart health, metabolism, hormonal balance, blood sugar control, and recovery from fatigue. Resistance training improves strength, endurance, balance, coordination, reaction time, metabolism, and muscle mass. Flexibility exercise improves circulation, posture, and balance. Seek guidance from your physician and exercise physiologist before implementing an exercise routine and learn your capabilities and proper form for all exercise.  Introduction to Yoga  Clinical staff conducted group or individual video education with verbal and written material and  guidebook.  Patient learns about yoga, a discipline of the coming together of mind, breath, and body. The benefits of yoga include improved flexibility, improved range of motion, better posture and core strength, increased lung function, weight loss, and positive self-image. Yoga's heart health benefits include lowered blood pressure, healthier heart rate, decreased cholesterol and triglyceride levels, improved immune function, and reduced stress. Seek guidance from your physician and exercise physiologist before implementing an exercise routine and learn your capabilities and proper form for all exercise.  Medical   Aging: Enhancing Your Quality of Life  Clinical staff conducted group or individual video education with verbal and written material and guidebook.  Patient learns key strategies and recommendations to stay in good physical health and enhance quality of life, such as prevention strategies, having an advocate, securing a Health Care Proxy and Power of Attorney, and keeping a list of medications and system for tracking them. It also discusses how to avoid risk for bone loss.  Biology of Weight Control  Clinical staff conducted group or individual video education with verbal and written material and guidebook.  Patient learns that weight gain occurs because we consume more calories than we burn (eating more, moving less). Even if your body weight is normal, you may have higher ratios of fat compared to muscle mass. Too much body fat puts you at increased risk for cardiovascular disease, heart attack, stroke, type 2 diabetes, and obesity-related cancers. In addition to exercise, following the Pritikin Eating Plan can help reduce your risk.  Decoding Lab Results  Clinical staff conducted group or individual video education with verbal and written material and guidebook.  Patient learns that lab test reflects one measurement whose values change over time and are influenced by many factors,  including medication, stress, sleep, exercise, food, hydration, pre-existing medical conditions, and more. It is recommended to use the knowledge from this video to become more involved with your lab results and evaluate your numbers to speak with your doctor.   Diseases of Our Time - Overview  Clinical staff conducted group or individual video education with verbal and written material and guidebook.  Patient learns that according to the CDC, 50% to 70% of chronic diseases (such as obesity, type 2 diabetes, elevated lipids, hypertension, and heart disease) are avoidable through lifestyle improvements including healthier food choices, listening to satiety cues, and increased physical activity.  Sleep Disorders Clinical staff conducted group or individual video education with verbal and written material and guidebook.  Patient learns how good quality and duration of sleep  are important to overall health and well-being. Patient also learns about sleep disorders and how they impact health along with recommendations to address them, including discussing with a physician.  Nutrition  Dining Out - Part 2 Clinical staff conducted group or individual video education with verbal and written material and guidebook.  Patient learns how to plan ahead and communicate in order to maximize their dining experience in a healthy and nutritious manner. Included are recommended food choices based on the type of restaurant the patient is visiting.   Fueling a Banker conducted group or individual video education with verbal and written material and guidebook.  There is a strong connection between our food choices and our health. Diseases like obesity and type 2 diabetes are very prevalent and are in large-part due to lifestyle choices. The Pritikin Eating Plan provides plenty of food and hunger-curbing satisfaction. It is easy to follow, affordable, and helps reduce health risks.  Menu Workshop   Clinical staff conducted group or individual video education with verbal and written material and guidebook.  Patient learns that restaurant meals can sabotage health goals because they are often packed with calories, fat, sodium, and sugar. Recommendations include strategies to plan ahead and to communicate with the manager, chef, or server to help order a healthier meal.  Planning Your Eating Strategy  Clinical staff conducted group or individual video education with verbal and written material and guidebook.  Patient learns about the Pritikin Eating Plan and its benefit of reducing the risk of disease. The Pritikin Eating Plan does not focus on calories. Instead, it emphasizes high-quality, nutrient-rich foods. By knowing the characteristics of the foods, we choose, we can determine their calorie density and make informed decisions.  Targeting Your Nutrition Priorities  Clinical staff conducted group or individual video education with verbal and written material and guidebook.  Patient learns that lifestyle habits have a tremendous impact on disease risk and progression. This video provides eating and physical activity recommendations based on your personal health goals, such as reducing LDL cholesterol, losing weight, preventing or controlling type 2 diabetes, and reducing high blood pressure.  Vitamins and Minerals  Clinical staff conducted group or individual video education with verbal and written material and guidebook.  Patient learns different ways to obtain key vitamins and minerals, including through a recommended healthy diet. It is important to discuss all supplements you take with your doctor.   Healthy Mind-Set    Smoking Cessation  Clinical staff conducted group or individual video education with verbal and written material and guidebook.  Patient learns that cigarette smoking and tobacco addiction pose a serious health risk which affects millions of people. Stopping smoking  will significantly reduce the risk of heart disease, lung disease, and many forms of cancer. Recommended strategies for quitting are covered, including working with your doctor to develop a successful plan.  Culinary   Becoming a Set designer conducted group or individual video education with verbal and written material and guidebook.  Patient learns that cooking at home can be healthy, cost-effective, quick, and puts them in control. Keys to cooking healthy recipes will include looking at your recipe, assessing your equipment needs, planning ahead, making it simple, choosing cost-effective seasonal ingredients, and limiting the use of added fats, salts, and sugars.  Cooking - Breakfast and Snacks  Clinical staff conducted group or individual video education with verbal and written material and guidebook.  Patient learns how important breakfast is to satiety and  nutrition through the entire day. Recommendations include key foods to eat during breakfast to help stabilize blood sugar levels and to prevent overeating at meals later in the day. Planning ahead is also a key component.  Cooking - Educational psychologist conducted group or individual video education with verbal and written material and guidebook.  Patient learns eating strategies to improve overall health, including an approach to cook more at home. Recommendations include thinking of animal protein as a side on your plate rather than center stage and focusing instead on lower calorie dense options like vegetables, fruits, whole grains, and plant-based proteins, such as beans. Making sauces in large quantities to freeze for later and leaving the skin on your vegetables are also recommended to maximize your experience.  Cooking - Healthy Salads and Dressing Clinical staff conducted group or individual video education with verbal and written material and guidebook.  Patient learns that vegetables, fruits, whole  grains, and legumes are the foundations of the Pritikin Eating Plan. Recommendations include how to incorporate each of these in flavorful and healthy salads, and how to create homemade salad dressings. Proper handling of ingredients is also covered. Cooking - Soups and [employer] - Soups and Desserts Clinical staff conducted group or individual video education with verbal and written material and guidebook.  Patient learns that Pritikin soups and desserts make for easy, nutritious, and delicious snacks and meal components that are low in sodium, fat, sugar, and calorie density, while high in vitamins, minerals, and filling fiber. Recommendations include simple and healthy ideas for soups and desserts.   Overview     The Pritikin Solution Program Overview Clinical staff conducted group or individual video education with verbal and written material and guidebook.  Patient learns that the results of the Pritikin Program have been documented in more than 100 articles published in peer-reviewed journals, and the benefits include reducing risk factors for (and, in some cases, even reversing) high cholesterol, high blood pressure, type 2 diabetes, obesity, and more! An overview of the three key pillars of the Pritikin Program will be covered: eating well, doing regular exercise, and having a healthy mind-set.  WORKSHOPS  Exercise: Exercise Basics: Building Your Action Plan Clinical staff led group instruction and group discussion with PowerPoint presentation and patient guidebook. To enhance the learning environment the use of posters, models and videos may be added. At the conclusion of this workshop, patients will comprehend the difference between physical activity and exercise, as well as the benefits of incorporating both, into their routine. Patients will understand the FITT (Frequency, Intensity, Time, and Type) principle and how to use it to build an exercise action plan. In addition,  safety concerns and other considerations for exercise and cardiac rehab will be addressed by the presenter. The purpose of this lesson is to promote a comprehensive and effective weekly exercise routine in order to improve patients' overall level of fitness.   Managing Heart Disease: Your Path to a Healthier Heart Clinical staff led group instruction and group discussion with PowerPoint presentation and patient guidebook. To enhance the learning environment the use of posters, models and videos may be added.At the conclusion of this workshop, patients will understand the anatomy and physiology of the heart. Additionally, they will understand how Pritikin's three pillars impact the risk factors, the progression, and the management of heart disease.  The purpose of this lesson is to provide a high-level overview of the heart, heart disease, and how the Pritikin lifestyle positively  impacts risk factors.  Exercise Biomechanics Clinical staff led group instruction and group discussion with PowerPoint presentation and patient guidebook. To enhance the learning environment the use of posters, models and videos may be added. Patients will learn how the structural parts of their bodies function and how these functions impact their daily activities, movement, and exercise. Patients will learn how to promote a neutral spine, learn how to manage pain, and identify ways to improve their physical movement in order to promote healthy living. The purpose of this lesson is to expose patients to common physical limitations that impact physical activity. Participants will learn practical ways to adapt and manage aches and pains, and to minimize their effect on regular exercise. Patients will learn how to maintain good posture while sitting, walking, and lifting.  Balance Training and Fall Prevention  Clinical staff led group instruction and group discussion with PowerPoint presentation and patient guidebook. To  enhance the learning environment the use of posters, models and videos may be added. At the conclusion of this workshop, patients will understand the importance of their sensorimotor skills (vision, proprioception, and the vestibular system) in maintaining their ability to balance as they age. Patients will apply a variety of balancing exercises that are appropriate for their current level of function. Patients will understand the common causes for poor balance, possible solutions to these problems, and ways to modify their physical environment in order to minimize their fall risk. The purpose of this lesson is to teach patients about the importance of maintaining balance as they age and ways to minimize their risk of falling.  WORKSHOPS   Nutrition:  Fueling a Ship broker led group instruction and group discussion with PowerPoint presentation and patient guidebook. To enhance the learning environment the use of posters, models and videos may be added. Patients will review the foundational principles of the Pritikin Eating Plan and understand what constitutes a serving size in each of the food groups. Patients will also learn Pritikin-friendly foods that are better choices when away from home and review make-ahead meal and snack options. Calorie density will be reviewed and applied to three nutrition priorities: weight maintenance, weight loss, and weight gain. The purpose of this lesson is to reinforce (in a group setting) the key concepts around what patients are recommended to eat and how to apply these guidelines when away from home by planning and selecting Pritikin-friendly options. Patients will understand how calorie density may be adjusted for different weight management goals.  Mindful Eating  Clinical staff led group instruction and group discussion with PowerPoint presentation and patient guidebook. To enhance the learning environment the use of posters, models and videos may  be added. Patients will briefly review the concepts of the Pritikin Eating Plan and the importance of low-calorie dense foods. The concept of mindful eating will be introduced as well as the importance of paying attention to internal hunger signals. Triggers for non-hunger eating and techniques for dealing with triggers will be explored. The purpose of this lesson is to provide patients with the opportunity to review the basic principles of the Pritikin Eating Plan, discuss the value of eating mindfully and how to measure internal cues of hunger and fullness using the Hunger Scale. Patients will also discuss reasons for non-hunger eating and learn strategies to use for controlling emotional eating.  Targeting Your Nutrition Priorities Clinical staff led group instruction and group discussion with PowerPoint presentation and patient guidebook. To enhance the learning environment the use of posters, models  and videos may be added. Patients will learn how to determine their genetic susceptibility to disease by reviewing their family history. Patients will gain insight into the importance of diet as part of an overall healthy lifestyle in mitigating the impact of genetics and other environmental insults. The purpose of this lesson is to provide patients with the opportunity to assess their personal nutrition priorities by looking at their family history, their own health history and current risk factors. Patients will also be able to discuss ways of prioritizing and modifying the Pritikin Eating Plan for their highest risk areas  Menu  Clinical staff led group instruction and group discussion with PowerPoint presentation and patient guidebook. To enhance the learning environment the use of posters, models and videos may be added. Using menus brought in from [employer], or printed from [employer], patients will apply the Pritikin dining out guidelines that were presented in the [employer] video. Patients will also be able to practice these guidelines in a variety of provided scenarios. The purpose of this lesson is to provide patients with the opportunity to practice hands-on learning of the Pritikin Dining Out guidelines with actual menus and practice scenarios.  Label Reading Clinical staff led group instruction and group discussion with PowerPoint presentation and patient guidebook. To enhance the learning environment the use of posters, models and videos may be added. Patients will review and discuss the Pritikin label reading guidelines presented in Pritikin's Label Reading Educational series video. Using fool labels brought in from local grocery stores and markets, patients will apply the label reading guidelines and determine if the packaged food meet the Pritikin guidelines. The purpose of this lesson is to provide patients with the opportunity to review, discuss, and practice hands-on learning of the Pritikin Label Reading guidelines with actual packaged food labels. Cooking School  Pritikin's [employer] are designed to teach patients ways to prepare quick, simple, and affordable recipes at home. The importance of nutrition's role in chronic disease risk reduction is reflected in its emphasis in the overall Pritikin program. By learning how to prepare essential core Pritikin Eating Plan recipes, patients will increase control over what they eat; be able to customize the flavor of foods without the use of added salt, sugar, or fat; and improve the quality of the food they consume. By learning a set of core recipes which are easily assembled, quickly prepared, and affordable, patients are more likely to prepare more healthy foods at home. These workshops focus on convenient breakfasts, simple entres, side dishes, and desserts which can be prepared with minimal effort and are consistent with nutrition recommendations for cardiovascular risk reduction. Cooking  [employer] are taught by a Armed forces logistics/support/administrative officer (RD) who has been trained by the [employer]. The chef or RD has a clear understanding of the importance of minimizing - if not completely eliminating - added fat, sugar, and sodium in recipes. Throughout the series of Cooking School Workshop sessions, patients will learn about healthy ingredients and efficient methods of cooking to build confidence in their capability to prepare    Cooking School weekly topics:  Adding Flavor- Sodium-Free  Fast and Healthy Breakfasts  Powerhouse Plant-Based Proteins  Satisfying Salads and Dressings  Simple Sides and Sauces  International Cuisine-Spotlight on the [employer] Zones  Delicious Desserts  Savory Soups  [employer] - Meals in a Astronomer Appetizers and Snacks  Comforting Weekend Breakfasts  One-Pot Wonders   Fast [employer]  Easy Entertaining  Personalizing Your Pritikin Plate  WORKSHOPS   Healthy Mindset (Psychosocial):  Focused Goals, Sustainable Changes Clinical staff led group instruction and group discussion with PowerPoint presentation and patient guidebook. To enhance the learning environment the use of posters, models and videos may be added. Patients will be able to apply effective goal setting strategies to establish at least one personal goal, and then take consistent, meaningful action toward that goal. They will learn to identify common barriers to achieving personal goals and develop strategies to overcome them. Patients will also gain an understanding of how our mind-set can impact our ability to achieve goals and the importance of cultivating a positive and growth-oriented mind-set. The purpose of this lesson is to provide patients with a deeper understanding of how to set and achieve personal goals, as well as the tools and strategies needed to overcome common obstacles which may arise along the way.  From Head to Heart: The Power of a Healthy  Outlook  Clinical staff led group instruction and group discussion with PowerPoint presentation and patient guidebook. To enhance the learning environment the use of posters, models and videos may be added. Patients will be able to recognize and describe the impact of emotions and mood on physical health. They will discover the importance of self-care and explore self-care practices which may work for them. Patients will also learn how to utilize the 4 C's to cultivate a healthier outlook and better manage stress and challenges. The purpose of this lesson is to demonstrate to patients how a healthy outlook is an essential part of maintaining good health, especially as they continue their cardiac rehab journey.  Healthy Sleep for a Healthy Heart Clinical staff led group instruction and group discussion with PowerPoint presentation and patient guidebook. To enhance the learning environment the use of posters, models and videos may be added. At the conclusion of this workshop, patients will be able to demonstrate knowledge of the importance of sleep to overall health, well-being, and quality of life. They will understand the symptoms of, and treatments for, common sleep disorders. Patients will also be able to identify daytime and nighttime behaviors which impact sleep, and they will be able to apply these tools to help manage sleep-related challenges. The purpose of this lesson is to provide patients with a general overview of sleep and outline the importance of quality sleep. Patients will learn about a few of the most common sleep disorders. Patients will also be introduced to the concept of "sleep hygiene," and discover ways to self-manage certain sleeping problems through simple daily behavior changes. Finally, the workshop will motivate patients by clarifying the links between quality sleep and their goals of heart-healthy living.   Recognizing and Reducing Stress Clinical staff led group instruction and  group discussion with PowerPoint presentation and patient guidebook. To enhance the learning environment the use of posters, models and videos may be added. At the conclusion of this workshop, patients will be able to understand the types of stress reactions, differentiate between acute and chronic stress, and recognize the impact that chronic stress has on their health. They will also be able to apply different coping mechanisms, such as reframing negative self-talk. Patients will have the opportunity to practice a variety of stress management techniques, such as deep abdominal breathing, progressive muscle relaxation, and/or guided imagery.  The purpose of this lesson is to educate patients on the role of stress in their lives and to provide healthy techniques for coping with it.  Learning  Barriers/Preferences:  Learning Barriers/Preferences - 05/04/23 1510       Learning Barriers/Preferences   Learning Barriers Sight    Learning Preferences Individual Instruction;Group Instruction;Skilled Demonstration             Education Topics:  Knowledge Questionnaire Score:  Knowledge Questionnaire Score - 05/04/23 1509       Knowledge Questionnaire Score   Pre Score 17/24             Core Components/Risk Factors/Patient Goals at Admission:  Personal Goals and Risk Factors at Admission - 05/04/23 1509       Core Components/Risk Factors/Patient Goals on Admission   Diabetes Yes    Intervention Provide education about signs/symptoms and action to take for hypo/hyperglycemia.;Provide education about proper nutrition, including hydration, and aerobic/resistive exercise prescription along with prescribed medications to achieve blood glucose in normal ranges: Fasting glucose 65-99 mg/dL    Expected Outcomes Short Term: Participant verbalizes understanding of the signs/symptoms and immediate care of hyper/hypoglycemia, proper foot care and importance of medication, aerobic/resistive exercise  and nutrition plan for blood glucose control.;Long Term: Attainment of HbA1C < 7%.    Hypertension Yes    Intervention Provide education on lifestyle modifcations including regular physical activity/exercise, weight management, moderate sodium restriction and increased consumption of fresh fruit, vegetables, and low fat dairy, alcohol moderation, and smoking cessation.;Monitor prescription use compliance.    Expected Outcomes Short Term: Continued assessment and intervention until BP is < 140/34mm HG in hypertensive participants. < 130/29mm HG in hypertensive participants with diabetes, heart failure or chronic kidney disease.;Long Term: Maintenance of blood pressure at goal levels.    Lipids Yes    Intervention Provide education and support for participant on nutrition & aerobic/resistive exercise along with prescribed medications to achieve LDL 70mg , HDL >40mg .    Expected Outcomes Short Term: Participant states understanding of desired cholesterol values and is compliant with medications prescribed. Participant is following exercise prescription and nutrition guidelines.;Long Term: Cholesterol controlled with medications as prescribed, with individualized exercise RX and with personalized nutrition plan. Value goals: LDL < 70mg , HDL > 40 mg.             Core Components/Risk Factors/Patient Goals Review:    Core Components/Risk Factors/Patient Goals at Discharge (Final Review):    ITP Comments:  ITP Comments     Row Name 05/04/23 1345           ITP Comments Armanda Magic, MD: Medical Director.  Introduction to the [facility] / Intensive Cardiac Rehab.  Initial orientation packet reviewed with the patient.                Comments: Participant attended orientation for the cardiac rehabilitation program on  05/04/2023  to perform initial intake and exercise walk test. Patient introduced to the Pritikin Program education and orientation packet was reviewed. Completed  6-minute walk test, measurements, initial ITP, and exercise prescription. Vital signs stable. Telemetry-normal sinus rhythm, asymptomatic.   Service time was from 13:30 to 15:10 .

## 2023-05-09 ENCOUNTER — Ambulatory Visit (HOSPITAL_COMMUNITY): Payer: Medicare Other | Attending: Internal Medicine

## 2023-05-09 DIAGNOSIS — I255 Ischemic cardiomyopathy: Secondary | ICD-10-CM | POA: Diagnosis not present

## 2023-05-09 LAB — ECHOCARDIOGRAM LIMITED
Area-P 1/2: 4.54 cm2
Calc EF: 69.3 %
S' Lateral: 1.9 cm
Single Plane A2C EF: 78 %
Single Plane A4C EF: 56.1 %

## 2023-05-10 ENCOUNTER — Encounter (HOSPITAL_COMMUNITY): Payer: Medicare Other

## 2023-05-11 ENCOUNTER — Ambulatory Visit: Payer: Self-pay | Admitting: Student

## 2023-05-12 ENCOUNTER — Encounter (HOSPITAL_COMMUNITY): Payer: Medicare Other

## 2023-05-15 ENCOUNTER — Other Ambulatory Visit: Payer: Self-pay | Admitting: Physician Assistant

## 2023-05-15 ENCOUNTER — Encounter (HOSPITAL_COMMUNITY)
Admission: RE | Admit: 2023-05-15 | Discharge: 2023-05-15 | Disposition: A | Discharge status: Home / Self Care | Payer: Medicare Other | Source: Ambulatory Visit | Attending: Internal Medicine | Admitting: Internal Medicine

## 2023-05-15 DIAGNOSIS — I214 Non-ST elevation (NSTEMI) myocardial infarction: Secondary | ICD-10-CM | POA: Insufficient documentation

## 2023-05-15 DIAGNOSIS — Z955 Presence of coronary angioplasty implant and graft: Secondary | ICD-10-CM | POA: Diagnosis not present

## 2023-05-15 LAB — GLUCOSE, CAPILLARY
Glucose-Capillary: 114 mg/dL — ABNORMAL HIGH (ref 70–99)
Glucose-Capillary: 87 mg/dL (ref 70–99)

## 2023-05-15 NOTE — Progress Notes (Signed)
Daily Session Note  Patient Details  Name: Frederick Knight MRN: [id number] Date of Birth: [birth year] Referring Provider:   Flowsheet Row INTENSIVE CARDIAC REHAB ORIENT from 05/04/2023 in [facility]  Referring Provider Alverda Skeans, MD       Encounter Date: 05/15/2023  Check In:  Session Check In - 05/15/23 1247       Check-In   Supervising physician immediately available to respond to emergencies [facility] MD immediately available    Physician(s) Carlyon Shadow, NP    Location [facility]    Staff Present Samantha Belarus, RD, LDN;Olinty Peggye Pitt, MS, ACSM-CEP, Exercise Physiologist;Jetta Dan Humphreys BS, ACSM-CEP, Exercise Physiologist;David Makemson, MS, ACSM-CEP, CCRP, Exercise Physiologist;Arya Boxley Harlon Flor, RN, Cathlean Cower, MS, Exercise Physiologist    Virtual Visit No    Medication changes reported     No    Fall or balance concerns reported    No    Tobacco Cessation No Change    Warm-up and Cool-down Performed as group-led instruction    Resistance Training Performed Yes    VAD Patient? No    PAD/SET Patient? No      Pain Assessment   Currently in Pain? No/denies    Multiple Pain Sites No             Capillary Blood Glucose: Results for orders placed or performed during the hospital encounter of 05/15/23 (from the past 24 hour(s))  Glucose, capillary     Status: Abnormal   Collection Time: 05/15/23  1:19 PM  Result Value Ref Range   Glucose-Capillary 114 (H) 70 - 99 mg/dL     Exercise Prescription Changes - 05/15/23 1400       Response to Exercise   Blood Pressure (Admit) 114/68    Blood Pressure (Exercise) 150/70    Blood Pressure (Exit) 104/70    Heart Rate (Admit) 71 bpm    Heart Rate (Exercise) 122 bpm    Heart Rate (Exit) 80 bpm    Rating of Perceived Exertion (Exercise) 12    Symptoms None    Comments Pt's first day in the [facility] program    Duration Continue with 30 min of  aerobic exercise without signs/symptoms of physical distress.    Intensity THRR unchanged      Progression   Progression Continue to progress workloads to maintain intensity without signs/symptoms of physical distress.    Average METs 2.75      Resistance Training   Training Prescription Yes    Weight 3 lbs    Reps 10-15    Time 10 Minutes      Interval Training   Interval Training No      Recumbant Bike   Level 1    RPM 76    Watts 25    Minutes 15    METs 2.2      NuStep   Level 2    SPM 91    Minutes 15    METs 3.3             Social History   Tobacco Use  Smoking Status Never  Smokeless Tobacco Never    Goals Met:  Exercise tolerated well No report of concerns or symptoms today Strength training completed today  Goals Unmet:  Not Applicable  Comments: Pt started cardiac rehab today.  Pt tolerated light exercise without difficulty. VSS, telemetry-Sinus Rhythm, asymptomatic.  Medication list reconciled. Pt denies barriers to medicaiton compliance.  PSYCHOSOCIAL ASSESSMENT:  PHQ-1. Pt exhibits positive coping skills, hopeful outlook with supportive family. No psychosocial needs identified at this time, no psychosocial interventions necessary.    Pt enjoys visiting friends and family, spending times out door and with his cat..   Pt oriented to exercise equipment and routine.    Understanding verbalized.Thayer Headings RN BSN     Dr. Armanda Magic is Medical Director for Cardiac Rehab at [facility].

## 2023-05-16 ENCOUNTER — Telehealth: Payer: Self-pay | Admitting: Internal Medicine

## 2023-05-16 NOTE — Progress Notes (Signed)
Cardiac Individual Treatment Plan  Patient Details  Name: Frederick Knight MRN: [id number] Date of Birth: 1951-05-01 Referring Provider:   Flowsheet Row INTENSIVE CARDIAC REHAB ORIENT from 05/04/2023 in [facility]  Referring Provider Alverda Skeans, MD       Initial Encounter Date:  Flowsheet Row INTENSIVE CARDIAC REHAB ORIENT from 05/04/2023 in [facility]  Date 05/04/23       Visit Diagnosis: 03/29/23 S/P drug eluting coronary stent placement, LAD/ RCA  03/28/23 NSTEMI (non-ST elevated myocardial infarction) [facility])  Patient's Home Medications on Admission:  Current Outpatient Medications:    acetaminophen (TYLENOL) 325 MG tablet, Take 2 tablets (650 mg total) by mouth every 4 (four) hours as needed for headache or mild pain., Disp: 30 tablet, Rfl: 1   apixaban (ELIQUIS) 5 MG TABS tablet, Take 1 tablet (5 mg total) by mouth 2 (two) times daily., Disp: 60 tablet, Rfl: 0   aspirin 81 MG chewable tablet, Chew 1 tablet (81 mg total) by mouth daily. (Patient not taking: Reported on 05/04/2023), Disp: 30 tablet, Rfl: 1   atorvastatin (LIPITOR) 80 MG tablet, Take 1 tablet (80 mg total) by mouth daily., Disp: 90 tablet, Rfl: 1   clopidogrel (PLAVIX) 75 MG tablet, Take 1 tablet (75 mg total) by mouth daily., Disp: 30 tablet, Rfl: 0   empagliflozin (JARDIANCE) 10 MG TABS tablet, TAKE 1 TABLET BY MOUTH EVERY DAY, Disp: 90 tablet, Rfl: 3   losartan (COZAAR) 25 MG tablet, Take 0.5 tablets (12.5 mg total) by mouth at bedtime., Disp: 45 tablet, Rfl: 3   metFORMIN (GLUCOPHAGE-XR) 500 MG 24 hr tablet, TAKE 1 TABLET BY MOUTH EVERY DAY WITH BREAKFAST (Patient taking differently: 500 mg daily with breakfast.), Disp: 90 tablet, Rfl: 1   metoprolol succinate (TOPROL-XL) 25 MG 24 hr tablet, Take 1.5 tablets (37.5 mg total) by mouth at bedtime., Disp: 45 tablet, Rfl: 3   tamsulosin (FLOMAX) 0.4  MG CAPS capsule, Take 1 capsule (0.4 mg total) by mouth daily., Disp: 30 capsule, Rfl: 0  Past Medical History: Past Medical History:  Diagnosis Date   Diabetes mellitus without complication (HCC)    Hyperlipemia    Controlled well with medications    Tobacco Use: Social History   Tobacco Use  Smoking Status Never  Smokeless Tobacco Never    Labs: Review Flowsheet  More data exists      Latest Ref Rng & Units 06/07/2019 12/18/2020 01/04/2022 03/28/2023 03/29/2023  Labs for ITP Cardiac and Pulmonary Rehab  Cholestrol 0 - 200 mg/dL - - 409  - 811   LDL (calc) 0 - 99 mg/dL - - 84  - 53   HDL-C >91 mg/dL - - 44  - 45   Trlycerides <150 mg/dL - - 478  - 79   Hemoglobin A1c 4.8 - 5.6 % 6.6  5.6  6.0  6.1  -    Capillary Blood Glucose: Lab Results  Component Value Date   GLUCAP 114 (H) 05/15/2023   GLUCAP 87 05/15/2023   GLUCAP 99 05/04/2023   GLUCAP 72 03/30/2023   GLUCAP 114 (H) 03/30/2023     Exercise Target Goals: Exercise Program Goal: Individual exercise prescription set using results from initial 6 min walk test and THRR while considering  patient's activity barriers and safety.   Exercise Prescription Goal: Initial exercise prescription builds to 30-45 minutes a day of aerobic activity, 2-3 days per week.  Home exercise guidelines will be given to patient during program as part of exercise prescription that the participant will acknowledge.  Activity Barriers & Risk Stratification:  Activity Barriers & Cardiac Risk Stratification - 05/04/23 1511       Activity Barriers & Cardiac Risk Stratification   Activity Barriers Balance Concerns    Cardiac Risk Stratification High             6 Minute Walk:  6 Minute Walk     Row Name 05/04/23 1430         6 Minute Walk   Phase Initial     Distance 1177 feet     Walk Time 6 minutes     # of Rest Breaks 0     MPH 2.23     METS 2.73     RPE 8     Perceived Dyspnea  0     VO2 Peak 9.6     Symptoms No      Resting HR 95 bpm     Resting BP 122/68     Resting Oxygen Saturation  96 %     Exercise Oxygen Saturation  during 6 min walk 96 %     Max Ex. HR 105 bpm     Max Ex. BP 124/60     2 Minute Post BP 108/60              Oxygen Initial Assessment:   Oxygen Re-Evaluation:   Oxygen Discharge (Final Oxygen Re-Evaluation):   Initial Exercise Prescription:  Initial Exercise Prescription - 05/04/23 1500       Date of Initial Exercise RX and Referring Provider   Date 05/04/23    Referring Provider Alverda Skeans, MD    Expected Discharge Date 07/14/23      NuStep   Level 2    SPM 75    Minutes 15    METs 2.7      Arm Ergometer   Level 1    Watts 25    RPM 60    Minutes 15    METs 2.7      Prescription Details   Frequency (times per week) 3    Duration Progress to 30 minutes of continuous aerobic without signs/symptoms of physical distress      Intensity   THRR 40-80% of Max Heartrate 59-118    Ratings of Perceived Exertion 11-13    Perceived Dyspnea 0-4      Progression   Progression Continue progressive overload as per policy without signs/symptoms or physical distress.      Resistance Training   Training Prescription Yes    Weight 3 lbs    Reps 10-15             Perform Capillary Blood Glucose checks as needed.  Exercise Prescription Changes:   Exercise Prescription Changes     Row Name 05/15/23 1400             Response to Exercise   Blood Pressure (Admit) 114/68       Blood Pressure (Exercise) 150/70       Blood Pressure (Exit) 104/70       Heart Rate (Admit) 71 bpm       Heart Rate (Exercise) 122 bpm       Heart Rate (Exit) 80 bpm       Rating of Perceived Exertion (Exercise) 12       Symptoms None       Comments Pt's first  day in the CRP2 program       Duration Continue with 30 min of aerobic exercise without signs/symptoms of physical distress.       Intensity THRR unchanged         Progression   Progression Continue to progress  workloads to maintain intensity without signs/symptoms of physical distress.       Average METs 2.75         Resistance Training   Training Prescription Yes       Weight 3 lbs       Reps 10-15       Time 10 Minutes         Interval Training   Interval Training No         Recumbant Bike   Level 1       RPM 76       Watts 25       Minutes 15       METs 2.2         NuStep   Level 2       SPM 91       Minutes 15       METs 3.3                Exercise Comments:   Exercise Comments     Row Name 05/15/23 1443           Exercise Comments Pt's first day in the CRP2 program. Pt exercised without complaints.                Exercise Goals and Review:   Exercise Goals     Row Name 05/04/23 1511             Exercise Goals   Increase Physical Activity Yes       Intervention Provide advice, education, support and counseling about physical activity/exercise needs.;Develop an individualized exercise prescription for aerobic and resistive training based on initial evaluation findings, risk stratification, comorbidities and participant's personal goals.       Expected Outcomes Short Term: Attend rehab on a regular basis to increase amount of physical activity.;Long Term: Add in home exercise to make exercise part of routine and to increase amount of physical activity.;Long Term: Exercising regularly at least 3-5 days a week.       Increase Strength and Stamina Yes       Intervention Provide advice, education, support and counseling about physical activity/exercise needs.;Develop an individualized exercise prescription for aerobic and resistive training based on initial evaluation findings, risk stratification, comorbidities and participant's personal goals.       Expected Outcomes Short Term: Increase workloads from initial exercise prescription for resistance, speed, and METs.;Short Term: Perform resistance training exercises routinely during rehab and add in resistance  training at home;Long Term: Improve cardiorespiratory fitness, muscular endurance and strength as measured by increased METs and functional capacity ( )       Able to understand and use rate of perceived exertion (RPE) scale Yes       Intervention Provide education and explanation on how to use RPE scale       Expected Outcomes Short Term: Able to use RPE daily in rehab to express subjective intensity level;Long Term:  Able to use RPE to guide intensity level when exercising independently       Knowledge and understanding of Target Heart Rate Range (THRR) Yes       Intervention Provide education and explanation of THRR including how  the numbers were predicted and where they are located for reference       Expected Outcomes Short Term: Able to state/look up THRR;Long Term: Able to use THRR to govern intensity when exercising independently;Short Term: Able to use daily as guideline for intensity in rehab       Understanding of Exercise Prescription Yes       Intervention Provide education, explanation, and written materials on patient's individual exercise prescription       Expected Outcomes Short Term: Able to explain program exercise prescription;Long Term: Able to explain home exercise prescription to exercise independently                Exercise Goals Re-Evaluation :  Exercise Goals Re-Evaluation     Row Name 05/15/23 1442             Exercise Goal Re-Evaluation   Exercise Goals Review Increase Physical Activity;Increase Strength and Stamina;Able to understand and use rate of perceived exertion (RPE) scale;Knowledge and understanding of Target Heart Rate Range (THRR);Understanding of Exercise Prescription       Comments Pt's first day in the CRP2 program. Pt understnads the exercise Rx, RPE scale and THRR.       Expected Outcomes Will continue to monitor the patient and progress exercise workloads as tolerated.                Discharge Exercise Prescription (Final Exercise  Prescription Changes):  Exercise Prescription Changes - 05/15/23 1400       Response to Exercise   Blood Pressure (Admit) 114/68    Blood Pressure (Exercise) 150/70    Blood Pressure (Exit) 104/70    Heart Rate (Admit) 71 bpm    Heart Rate (Exercise) 122 bpm    Heart Rate (Exit) 80 bpm    Rating of Perceived Exertion (Exercise) 12    Symptoms None    Comments Pt's first day in the CRP2 program    Duration Continue with 30 min of aerobic exercise without signs/symptoms of physical distress.    Intensity THRR unchanged      Progression   Progression Continue to progress workloads to maintain intensity without signs/symptoms of physical distress.    Average METs 2.75      Resistance Training   Training Prescription Yes    Weight 3 lbs    Reps 10-15    Time 10 Minutes      Interval Training   Interval Training No      Recumbant Bike   Level 1    RPM 76    Watts 25    Minutes 15    METs 2.2      NuStep   Level 2    SPM 91    Minutes 15    METs 3.3             Nutrition:  Target Goals: Understanding of nutrition guidelines, daily intake of sodium 1500mg , cholesterol 200mg , calories 30% from fat and 7% or less from saturated fats, daily to have 5 or more servings of fruits and vegetables.  Biometrics:  Pre Biometrics - 05/04/23 1355       Pre Biometrics   Waist Circumference 38 inches    Hip Circumference 39 inches    Waist to Hip Ratio 0.97 %    Triceps Skinfold 9 mm    Grip Strength 32 kg    Flexibility 14 in    Single Leg Stand 6.08 seconds  Post Biometrics - 05/04/23 1355        Post  Biometrics   % Body Fat 23.5 %             Nutrition Therapy Plan and Nutrition Goals:  Nutrition Therapy & Goals - 05/15/23 1338       Nutrition Therapy   Diet Heart healthy diet    Drug/Food Interactions Statins/Certain Fruits      Personal Nutrition Goals   Nutrition Goal Patient to identify strategies for reducing cardiovascular  risk by attending the Pritikin education and nutrition series weekly.    Personal Goal #2 Patient to improve diet quality by using the plate method as a guide for meal planning to include lean protein/plant protein, fruits, vegetables, whole grains, nonfat dairy as part of a well-balanced diet.    Comments Patient's A1c is well controlled. Patient will benefit from participation in intensive cardiac rehab for nutrition, exercise, and lifestyle modification.      Intervention Plan   Intervention Prescribe, educate and counsel regarding individualized specific dietary modifications aiming towards targeted core components such as weight, hypertension, lipid management, diabetes, heart failure and other comorbidities.;Nutrition handout(s) given to patient.    Expected Outcomes Short Term Goal: Understand basic principles of dietary content, such as calories, fat, sodium, cholesterol and nutrients.;Long Term Goal: Adherence to prescribed nutrition plan.             Nutrition Assessments:  Nutrition Assessments - 05/15/23 1424       Rate Your Plate Scores   Pre Score 73            MEDIFICTS Score Key: ?70 Need to make dietary changes  40-70 Heart Healthy Diet ? 40 Therapeutic Level Cholesterol Diet   Flowsheet Row INTENSIVE CARDIAC REHAB from 05/15/2023 in [facility]  Picture Your Plate Total Score on Admission 73      Picture Your Plate Scores: <78 Unhealthy dietary pattern with much room for improvement. 41-50 Dietary pattern unlikely to meet recommendations for good health and room for improvement. 51-60 More healthful dietary pattern, with some room for improvement.  >60 Healthy dietary pattern, although there may be some specific behaviors that could be improved.    Nutrition Goals Re-Evaluation:  Nutrition Goals Re-Evaluation     Row Name 05/15/23 1338             Goals   Current Weight 158 lb 15.2 oz (72.1  kg)       Comment lipids WNL, Lipoprotein A WNL, A1c 6.1       Expected Outcome Patient's A1c is well controlled. Patient will benefit from participation in intensive cardiac rehab for nutrition, exercise, and lifestyle modification.                Nutrition Goals Re-Evaluation:  Nutrition Goals Re-Evaluation     Row Name 05/15/23 1338             Goals   Current Weight 158 lb 15.2 oz (72.1 kg)       Comment lipids WNL, Lipoprotein A WNL, A1c 6.1       Expected Outcome Patient's A1c is well controlled. Patient will benefit from participation in intensive cardiac rehab for nutrition, exercise, and lifestyle modification.                Nutrition Goals Discharge (Final Nutrition Goals Re-Evaluation):  Nutrition Goals Re-Evaluation - 05/15/23 1338       Goals  Current Weight 158 lb 15.2 oz (72.1 kg)    Comment lipids WNL, Lipoprotein A WNL, A1c 6.1    Expected Outcome Patient's A1c is well controlled. Patient will benefit from participation in intensive cardiac rehab for nutrition, exercise, and lifestyle modification.             Psychosocial: Target Goals: Acknowledge presence or absence of significant depression and/or stress, maximize coping skills, provide positive support system. Participant is able to verbalize types and ability to use techniques and skills needed for reducing stress and depression.  Initial Review & Psychosocial Screening:  Initial Psych Review & Screening - 05/04/23 1506       Initial Review   Current issues with None Identified      Family Dynamics   Good Support System? Yes   Has mother, sister and daughter for support     Barriers   Psychosocial barriers to participate in program There are no identifiable barriers or psychosocial needs.      Screening Interventions   Interventions Encouraged to exercise             Quality of Life Scores:  Quality of Life - 05/04/23 1532       Quality of Life   Select Quality of  Life      Quality of Life Scores   Health/Function Pre 28.77 %    Socioeconomic Pre 29.06 %    Psych/Spiritual Pre 30 %    Family Pre 27.6 %    GLOBAL Pre 28.91 %            Scores of 19 and below usually indicate a poorer quality of life in these areas.  A difference of  2-3 points is a clinically meaningful difference.  A difference of 2-3 points in the total score of the Quality of Life Index has been associated with significant improvement in overall quality of life, self-image, physical symptoms, and general health in studies assessing change in quality of life.  PHQ-9: Review Flowsheet  More data exists      05/04/2023 04/06/2023 12/15/2022 04/14/2022 02/24/2022  Depression screen PHQ 2/9  Decreased Interest 0 0 0 0 0  Down, Depressed, Hopeless 0 0 0 0 0  PHQ - 2 Score 0 0 0 0 0  Altered sleeping 0 2 0 0 0  Tired, decreased energy 1 2 0 0 0  Change in appetite 0 0 0 0 0  Feeling bad or failure about yourself  0 0 0 0 0  Trouble concentrating 0 0 0 0 0  Moving slowly or fidgety/restless 0 0 0 0 0  Suicidal thoughts 0 0 0 0 0  PHQ-9 Score 1 4 0 0 0  Difficult doing work/chores Not difficult at all Somewhat difficult Not difficult at all Not difficult at all -   Interpretation of Total Score  Total Score Depression Severity:  1-4 = Minimal depression, 5-9 = Mild depression, 10-14 = Moderate depression, 15-19 = Moderately severe depression, 20-27 = Severe depression   Psychosocial Evaluation and Intervention:   Psychosocial Re-Evaluation:  Psychosocial Re-Evaluation     Row Name 05/15/23 1425             Psychosocial Re-Evaluation   Current issues with None Identified       Interventions Encouraged to attend Cardiac Rehabilitation for the exercise       Continue Psychosocial Services  No Follow up required  Psychosocial Discharge (Final Psychosocial Re-Evaluation):  Psychosocial Re-Evaluation - 05/15/23 1425       Psychosocial Re-Evaluation    Current issues with None Identified    Interventions Encouraged to attend Cardiac Rehabilitation for the exercise    Continue Psychosocial Services  No Follow up required             Vocational Rehabilitation: Provide vocational rehab assistance to qualifying candidates.   Vocational Rehab Evaluation & Intervention:  Vocational Rehab - 05/04/23 1506       Initial Vocational Rehab Evaluation & Intervention   Assessment shows need for Vocational Rehabilitation No   No needs, pt back to work as an Runner, broadcasting/film/video            Education: Education Goals: Education classes will be provided on a weekly basis, covering required topics. Participant will state understanding/return demonstration of topics presented.     Core Videos: Exercise    Move It!  Clinical staff conducted group or individual video education with verbal and written material and guidebook.  Patient learns the recommended Pritikin exercise program. Exercise with the goal of living a long, healthy life. Some of the health benefits of exercise include controlled diabetes, healthier blood pressure levels, improved cholesterol levels, improved heart and lung capacity, improved sleep, and better body composition. Everyone should speak with their doctor before starting or changing an exercise routine.  Biomechanical Limitations Clinical staff conducted group or individual video education with verbal and written material and guidebook.  Patient learns how biomechanical limitations can impact exercise and how we can mitigate and possibly overcome limitations to have an impactful and balanced exercise routine.  Body Composition Clinical staff conducted group or individual video education with verbal and written material and guidebook.  Patient learns that body composition (ratio of muscle mass to fat mass) is a key component to assessing overall fitness, rather than body weight alone. Increased fat mass, especially visceral  belly fat, can put [place] at increased risk for metabolic syndrome, type 2 diabetes, heart disease, and even death. It is recommended to combine diet and exercise (cardiovascular and resistance training) to improve your body composition. Seek guidance from your physician and exercise physiologist before implementing an exercise routine.  Exercise Action Plan Clinical staff conducted group or individual video education with verbal and written material and guidebook.  Patient learns the recommended strategies to achieve and enjoy long-term exercise adherence, including variety, self-motivation, self-efficacy, and positive decision making. Benefits of exercise include fitness, good health, weight management, more energy, better sleep, less stress, and overall well-being.  Medical   Heart Disease Risk Reduction Clinical staff conducted group or individual video education with verbal and written material and guidebook.  Patient learns our heart is our most vital organ as it circulates oxygen, nutrients, white blood cells, and hormones throughout the entire body, and carries waste away. Data supports a plant-based eating plan like the Pritikin Program for its effectiveness in slowing progression of and reversing heart disease. The video provides a number of recommendations to address heart disease.   Metabolic Syndrome and Belly Fat  Clinical staff conducted group or individual video education with verbal and written material and guidebook.  Patient learns what metabolic syndrome is, how it leads to heart disease, and how one can reverse it and keep it from coming back. You have metabolic syndrome if you have 3 of the following 5 criteria: abdominal obesity, high blood pressure, high triglycerides, low HDL cholesterol, and high blood sugar.  Hypertension and Heart  Disease Clinical staff conducted group or individual video education with verbal and written material and guidebook.  Patient learns that high  blood pressure, or hypertension, is very common in the [place]. Hypertension is largely due to excessive salt intake, but other important risk factors include being overweight, physical inactivity, drinking too much alcohol, smoking, and not eating enough potassium from fruits and vegetables. High blood pressure is a leading risk factor for heart attack, stroke, congestive heart failure, dementia, kidney failure, and premature death. Long-term effects of excessive salt intake include stiffening of the arteries and thickening of heart muscle and organ damage. Recommendations include ways to reduce hypertension and the risk of heart disease.  Diseases of Our Time - Focusing on Diabetes Clinical staff conducted group or individual video education with verbal and written material and guidebook.  Patient learns why the best way to stop diseases of our time is prevention, through food and other lifestyle changes. Medicine (such as prescription pills and surgeries) is often only a Band-Aid on the problem, not a long-term solution. Most common diseases of our time include obesity, type 2 diabetes, hypertension, heart disease, and cancer. The Pritikin Program is recommended and has been proven to help reduce, reverse, and/or prevent the damaging effects of metabolic syndrome.  Nutrition   Overview of the Pritikin Eating Plan  Clinical staff conducted group or individual video education with verbal and written material and guidebook.  Patient learns about the Pritikin Eating Plan for disease risk reduction. The Pritikin Eating Plan emphasizes a wide variety of unrefined, minimally-processed carbohydrates, like fruits, vegetables, whole grains, and legumes. Go, Caution, and Stop food choices are explained. Plant-based and lean animal proteins are emphasized. Rationale provided for low sodium intake for blood pressure control, low added sugars for blood sugar stabilization, and low added fats and oils for  coronary artery disease risk reduction and weight management.  Calorie Density  Clinical staff conducted group or individual video education with verbal and written material and guidebook.  Patient learns about calorie density and how it impacts the Pritikin Eating Plan. Knowing the characteristics of the food you choose will help you decide whether those foods will lead to weight gain or weight loss, and whether you want to consume more or less of them. Weight loss is usually a side effect of the Pritikin Eating Plan because of its focus on low calorie-dense foods.  Label Reading  Clinical staff conducted group or individual video education with verbal and written material and guidebook.  Patient learns about the Pritikin recommended label reading guidelines and corresponding recommendations regarding calorie density, added sugars, sodium content, and whole grains.  Dining Out - Part 1  Clinical staff conducted group or individual video education with verbal and written material and guidebook.  Patient learns that restaurant meals can be sabotaging because they can be so high in calories, fat, sodium, and/or sugar. Patient learns recommended strategies on how to positively address this and avoid unhealthy pitfalls.  Facts on Fats  Clinical staff conducted group or individual video education with verbal and written material and guidebook.  Patient learns that lifestyle modifications can be just as effective, if not more so, as many medications for lowering your risk of heart disease. A Pritikin lifestyle can help to reduce your risk of inflammation and atherosclerosis (cholesterol build-up, or plaque, in the artery walls). Lifestyle interventions such as dietary choices and physical activity address the cause of atherosclerosis. A review of the types of fats and their impact on  blood cholesterol levels, along with dietary recommendations to reduce fat intake is also included.  Nutrition Action Plan   Clinical staff conducted group or individual video education with verbal and written material and guidebook.  Patient learns how to incorporate Pritikin recommendations into their lifestyle. Recommendations include planning and keeping personal health goals in mind as an important part of their success.  Healthy Mind-Set    Healthy Minds, Bodies, Hearts  Clinical staff conducted group or individual video education with verbal and written material and guidebook.  Patient learns how to identify when they are stressed. Video will discuss the impact of that stress, as well as the many benefits of stress management. Patient will also be introduced to stress management techniques. The way we think, act, and feel has an impact on our hearts.  How Our Thoughts Can Heal Our Hearts  Clinical staff conducted group or individual video education with verbal and written material and guidebook.  Patient learns that negative thoughts can cause depression and anxiety. This can result in negative lifestyle behavior and serious health problems. Cognitive behavioral therapy is an effective method to help control our thoughts in order to change and improve our emotional outlook.  Additional Videos:  Exercise    Improving Performance  Clinical staff conducted group or individual video education with verbal and written material and guidebook.  Patient learns to use a non-linear approach by alternating intensity levels and lengths of time spent exercising to help burn more calories and lose more body fat. Cardiovascular exercise helps improve heart health, metabolism, hormonal balance, blood sugar control, and recovery from fatigue. Resistance training improves strength, endurance, balance, coordination, reaction time, metabolism, and muscle mass. Flexibility exercise improves circulation, posture, and balance. Seek guidance from your physician and exercise physiologist before implementing an exercise routine and learn  your capabilities and proper form for all exercise.  Introduction to Yoga  Clinical staff conducted group or individual video education with verbal and written material and guidebook.  Patient learns about yoga, a discipline of the coming together of mind, breath, and body. The benefits of yoga include improved flexibility, improved range of motion, better posture and core strength, increased lung function, weight loss, and positive self-image. Yoga's heart health benefits include lowered blood pressure, healthier heart rate, decreased cholesterol and triglyceride levels, improved immune function, and reduced stress. Seek guidance from your physician and exercise physiologist before implementing an exercise routine and learn your capabilities and proper form for all exercise.  Medical   Aging: Enhancing Your Quality of Life  Clinical staff conducted group or individual video education with verbal and written material and guidebook.  Patient learns key strategies and recommendations to stay in good physical health and enhance quality of life, such as prevention strategies, having an advocate, securing a Health Care Proxy and Power of Attorney, and keeping a list of medications and system for tracking them. It also discusses how to avoid risk for bone loss.  Biology of Weight Control  Clinical staff conducted group or individual video education with verbal and written material and guidebook.  Patient learns that weight gain occurs because we consume more calories than we burn (eating more, moving less). Even if your body weight is normal, you may have higher ratios of fat compared to muscle mass. Too much body fat puts you at increased risk for cardiovascular disease, heart attack, stroke, type 2 diabetes, and obesity-related cancers. In addition to exercise, following the Pritikin Eating Plan can help reduce your risk.  Decoding  Lab Results  Clinical staff conducted group or individual video education  with verbal and written material and guidebook.  Patient learns that lab test reflects one measurement whose values change over time and are influenced by many factors, including medication, stress, sleep, exercise, food, hydration, pre-existing medical conditions, and more. It is recommended to use the knowledge from this video to become more involved with your lab results and evaluate your numbers to speak with your doctor.   Diseases of Our Time - Overview  Clinical staff conducted group or individual video education with verbal and written material and guidebook.  Patient learns that according to the CDC, 50% to 70% of chronic diseases (such as obesity, type 2 diabetes, elevated lipids, hypertension, and heart disease) are avoidable through lifestyle improvements including healthier food choices, listening to satiety cues, and increased physical activity.  Sleep Disorders Clinical staff conducted group or individual video education with verbal and written material and guidebook.  Patient learns how good quality and duration of sleep are important to overall health and well-being. Patient also learns about sleep disorders and how they impact health along with recommendations to address them, including discussing with a physician.  Nutrition  Dining Out - Part 2 Clinical staff conducted group or individual video education with verbal and written material and guidebook.  Patient learns how to plan ahead and communicate in order to maximize their dining experience in a healthy and nutritious manner. Included are recommended food choices based on the type of restaurant the patient is visiting.   Fueling a Banker conducted group or individual video education with verbal and written material and guidebook.  There is a strong connection between our food choices and our health. Diseases like obesity and type 2 diabetes are very prevalent and are in large-part due to lifestyle  choices. The Pritikin Eating Plan provides plenty of food and hunger-curbing satisfaction. It is easy to follow, affordable, and helps reduce health risks.  Menu Workshop  Clinical staff conducted group or individual video education with verbal and written material and guidebook.  Patient learns that restaurant meals can sabotage health goals because they are often packed with calories, fat, sodium, and sugar. Recommendations include strategies to plan ahead and to communicate with the manager, chef, or server to help order a healthier meal.  Planning Your Eating Strategy  Clinical staff conducted group or individual video education with verbal and written material and guidebook.  Patient learns about the Pritikin Eating Plan and its benefit of reducing the risk of disease. The Pritikin Eating Plan does not focus on calories. Instead, it emphasizes high-quality, nutrient-rich foods. By knowing the characteristics of the foods, we choose, we can determine their calorie density and make informed decisions.  Targeting Your Nutrition Priorities  Clinical staff conducted group or individual video education with verbal and written material and guidebook.  Patient learns that lifestyle habits have a tremendous impact on disease risk and progression. This video provides eating and physical activity recommendations based on your personal health goals, such as reducing LDL cholesterol, losing weight, preventing or controlling type 2 diabetes, and reducing high blood pressure.  Vitamins and Minerals  Clinical staff conducted group or individual video education with verbal and written material and guidebook.  Patient learns different ways to obtain key vitamins and minerals, including through a recommended healthy diet. It is important to discuss all supplements you take with your doctor.   Healthy Mind-Set    Smoking Cessation  Clinical staff  conducted group or individual video education with verbal and  written material and guidebook.  Patient learns that cigarette smoking and tobacco addiction pose a serious health risk which affects millions of people. Stopping smoking will significantly reduce the risk of heart disease, lung disease, and many forms of cancer. Recommended strategies for quitting are covered, including working with your doctor to develop a successful plan.  Culinary   Becoming a Set designer conducted group or individual video education with verbal and written material and guidebook.  Patient learns that cooking at home can be healthy, cost-effective, quick, and puts them in control. Keys to cooking healthy recipes will include looking at your recipe, assessing your equipment needs, planning ahead, making it simple, choosing cost-effective seasonal ingredients, and limiting the use of added fats, salts, and sugars.  Cooking - Breakfast and Snacks  Clinical staff conducted group or individual video education with verbal and written material and guidebook.  Patient learns how important breakfast is to satiety and nutrition through the entire day. Recommendations include key foods to eat during breakfast to help stabilize blood sugar levels and to prevent overeating at meals later in the day. Planning ahead is also a key component.  Cooking - Educational psychologist conducted group or individual video education with verbal and written material and guidebook.  Patient learns eating strategies to improve overall health, including an approach to cook more at home. Recommendations include thinking of animal protein as a side on your plate rather than center stage and focusing instead on lower calorie dense options like vegetables, fruits, whole grains, and plant-based proteins, such as beans. Making sauces in large quantities to freeze for later and leaving the skin on your vegetables are also recommended to maximize your experience.  Cooking - Healthy Salads and  Dressing Clinical staff conducted group or individual video education with verbal and written material and guidebook.  Patient learns that vegetables, fruits, whole grains, and legumes are the foundations of the Pritikin Eating Plan. Recommendations include how to incorporate each of these in flavorful and healthy salads, and how to create homemade salad dressings. Proper handling of ingredients is also covered. Cooking - Soups and [employer] - Soups and Desserts Clinical staff conducted group or individual video education with verbal and written material and guidebook.  Patient learns that Pritikin soups and desserts make for easy, nutritious, and delicious snacks and meal components that are low in sodium, fat, sugar, and calorie density, while high in vitamins, minerals, and filling fiber. Recommendations include simple and healthy ideas for soups and desserts.   Overview     The Pritikin Solution Program Overview Clinical staff conducted group or individual video education with verbal and written material and guidebook.  Patient learns that the results of the Pritikin Program have been documented in more than 100 articles published in peer-reviewed journals, and the benefits include reducing risk factors for (and, in some cases, even reversing) high cholesterol, high blood pressure, type 2 diabetes, obesity, and more! An overview of the three key pillars of the Pritikin Program will be covered: eating well, doing regular exercise, and having a healthy mind-set.  WORKSHOPS  Exercise: Exercise Basics: Building Your Action Plan Clinical staff led group instruction and group discussion with PowerPoint presentation and patient guidebook. To enhance the learning environment the use of posters, models and videos may be added. At the conclusion of this workshop, patients will comprehend the difference between physical activity and exercise, as  well as the benefits of incorporating both, into  their routine. Patients will understand the FITT (Frequency, Intensity, Time, and Type) principle and how to use it to build an exercise action plan. In addition, safety concerns and other considerations for exercise and cardiac rehab will be addressed by the presenter. The purpose of this lesson is to promote a comprehensive and effective weekly exercise routine in order to improve patients' overall level of fitness.   Managing Heart Disease: Your Path to a Healthier Heart Clinical staff led group instruction and group discussion with PowerPoint presentation and patient guidebook. To enhance the learning environment the use of posters, models and videos may be added.At the conclusion of this workshop, patients will understand the anatomy and physiology of the heart. Additionally, they will understand how Pritikin's three pillars impact the risk factors, the progression, and the management of heart disease.  The purpose of this lesson is to provide a high-level overview of the heart, heart disease, and how the Pritikin lifestyle positively impacts risk factors.  Exercise Biomechanics Clinical staff led group instruction and group discussion with PowerPoint presentation and patient guidebook. To enhance the learning environment the use of posters, models and videos may be added. Patients will learn how the structural parts of their bodies function and how these functions impact their daily activities, movement, and exercise. Patients will learn how to promote a neutral spine, learn how to manage pain, and identify ways to improve their physical movement in order to promote healthy living. The purpose of this lesson is to expose patients to common physical limitations that impact physical activity. Participants will learn practical ways to adapt and manage aches and pains, and to minimize their effect on regular exercise. Patients will learn how to maintain good posture while sitting, walking, and  lifting.  Balance Training and Fall Prevention  Clinical staff led group instruction and group discussion with PowerPoint presentation and patient guidebook. To enhance the learning environment the use of posters, models and videos may be added. At the conclusion of this workshop, patients will understand the importance of their sensorimotor skills (vision, proprioception, and the vestibular system) in maintaining their ability to balance as they age. Patients will apply a variety of balancing exercises that are appropriate for their current level of function. Patients will understand the common causes for poor balance, possible solutions to these problems, and ways to modify their physical environment in order to minimize their fall risk. The purpose of this lesson is to teach patients about the importance of maintaining balance as they age and ways to minimize their risk of falling.  WORKSHOPS   Nutrition:  Fueling a Ship broker led group instruction and group discussion with PowerPoint presentation and patient guidebook. To enhance the learning environment the use of posters, models and videos may be added. Patients will review the foundational principles of the Pritikin Eating Plan and understand what constitutes a serving size in each of the food groups. Patients will also learn Pritikin-friendly foods that are better choices when away from home and review make-ahead meal and snack options. Calorie density will be reviewed and applied to three nutrition priorities: weight maintenance, weight loss, and weight gain. The purpose of this lesson is to reinforce (in a group setting) the key concepts around what patients are recommended to eat and how to apply these guidelines when away from home by planning and selecting Pritikin-friendly options. Patients will understand how calorie density may be adjusted for different weight management goals.  Mindful Eating  Clinical staff led  group instruction and group discussion with PowerPoint presentation and patient guidebook. To enhance the learning environment the use of posters, models and videos may be added. Patients will briefly review the concepts of the Pritikin Eating Plan and the importance of low-calorie dense foods. The concept of mindful eating will be introduced as well as the importance of paying attention to internal hunger signals. Triggers for non-hunger eating and techniques for dealing with triggers will be explored. The purpose of this lesson is to provide patients with the opportunity to review the basic principles of the Pritikin Eating Plan, discuss the value of eating mindfully and how to measure internal cues of hunger and fullness using the Hunger Scale. Patients will also discuss reasons for non-hunger eating and learn strategies to use for controlling emotional eating.  Targeting Your Nutrition Priorities Clinical staff led group instruction and group discussion with PowerPoint presentation and patient guidebook. To enhance the learning environment the use of posters, models and videos may be added. Patients will learn how to determine their genetic susceptibility to disease by reviewing their family history. Patients will gain insight into the importance of diet as part of an overall healthy lifestyle in mitigating the impact of genetics and other environmental insults. The purpose of this lesson is to provide patients with the opportunity to assess their personal nutrition priorities by looking at their family history, their own health history and current risk factors. Patients will also be able to discuss ways of prioritizing and modifying the Pritikin Eating Plan for their highest risk areas  Menu  Clinical staff led group instruction and group discussion with PowerPoint presentation and patient guidebook. To enhance the learning environment the use of posters, models and videos may be added. Using menus  brought in from [employer], or printed from [employer], patients will apply the Pritikin dining out guidelines that were presented in the [employer] video. Patients will also be able to practice these guidelines in a variety of provided scenarios. The purpose of this lesson is to provide patients with the opportunity to practice hands-on learning of the Pritikin Dining Out guidelines with actual menus and practice scenarios.  Label Reading Clinical staff led group instruction and group discussion with PowerPoint presentation and patient guidebook. To enhance the learning environment the use of posters, models and videos may be added. Patients will review and discuss the Pritikin label reading guidelines presented in Pritikin's Label Reading Educational series video. Using fool labels brought in from local grocery stores and markets, patients will apply the label reading guidelines and determine if the packaged food meet the Pritikin guidelines. The purpose of this lesson is to provide patients with the opportunity to review, discuss, and practice hands-on learning of the Pritikin Label Reading guidelines with actual packaged food labels. Cooking School  Pritikin's [employer] are designed to teach patients ways to prepare quick, simple, and affordable recipes at home. The importance of nutrition's role in chronic disease risk reduction is reflected in its emphasis in the overall Pritikin program. By learning how to prepare essential core Pritikin Eating Plan recipes, patients will increase control over what they eat; be able to customize the flavor of foods without the use of added salt, sugar, or fat; and improve the quality of the food they consume. By learning a set of core recipes which are easily assembled, quickly prepared, and affordable, patients are more likely to prepare more healthy foods at home. These  workshops focus on convenient breakfasts, simple  entres, side dishes, and desserts which can be prepared with minimal effort and are consistent with nutrition recommendations for cardiovascular risk reduction. Cooking [employer] are taught by a Armed forces logistics/support/administrative officer (RD) who has been trained by the [employer]. The chef or RD has a clear understanding of the importance of minimizing - if not completely eliminating - added fat, sugar, and sodium in recipes. Throughout the series of Cooking School Workshop sessions, patients will learn about healthy ingredients and efficient methods of cooking to build confidence in their capability to prepare    Cooking School weekly topics:  Adding Flavor- Sodium-Free  Fast and Healthy Breakfasts  Powerhouse Plant-Based Proteins  Satisfying Salads and Dressings  Simple Sides and Sauces  International Cuisine-Spotlight on the [employer] Zones  Delicious Desserts  Savory Soups  [employer] - Meals in a Astronomer Appetizers and Snacks  Comforting Weekend Breakfasts  One-Pot Wonders   Fast Evening Meals  Landscape architect Your Pritikin Plate  WORKSHOPS   Healthy Mindset (Psychosocial):  Focused Goals, Sustainable Changes Clinical staff led group instruction and group discussion with PowerPoint presentation and patient guidebook. To enhance the learning environment the use of posters, models and videos may be added. Patients will be able to apply effective goal setting strategies to establish at least one personal goal, and then take consistent, meaningful action toward that goal. They will learn to identify common barriers to achieving personal goals and develop strategies to overcome them. Patients will also gain an understanding of how our mind-set can impact our ability to achieve goals and the importance of cultivating a positive and growth-oriented mind-set. The purpose of this lesson is to provide patients with a deeper understanding of how to set and  achieve personal goals, as well as the tools and strategies needed to overcome common obstacles which may arise along the way.  From Head to Heart: The Power of a Healthy Outlook  Clinical staff led group instruction and group discussion with PowerPoint presentation and patient guidebook. To enhance the learning environment the use of posters, models and videos may be added. Patients will be able to recognize and describe the impact of emotions and mood on physical health. They will discover the importance of self-care and explore self-care practices which may work for them. Patients will also learn how to utilize the 4 C's to cultivate a healthier outlook and better manage stress and challenges. The purpose of this lesson is to demonstrate to patients how a healthy outlook is an essential part of maintaining good health, especially as they continue their cardiac rehab journey.  Healthy Sleep for a Healthy Heart Clinical staff led group instruction and group discussion with PowerPoint presentation and patient guidebook. To enhance the learning environment the use of posters, models and videos may be added. At the conclusion of this workshop, patients will be able to demonstrate knowledge of the importance of sleep to overall health, well-being, and quality of life. They will understand the symptoms of, and treatments for, common sleep disorders. Patients will also be able to identify daytime and nighttime behaviors which impact sleep, and they will be able to apply these tools to help manage sleep-related challenges. The purpose of this lesson is to provide patients with a general overview of sleep and outline the importance of quality sleep. Patients will learn about a few of the most common sleep disorders. Patients will also be introduced to the concept  of "sleep hygiene," and discover ways to self-manage certain sleeping problems through simple daily behavior changes. Finally, the workshop will motivate  patients by clarifying the links between quality sleep and their goals of heart-healthy living.   Recognizing and Reducing Stress Clinical staff led group instruction and group discussion with PowerPoint presentation and patient guidebook. To enhance the learning environment the use of posters, models and videos may be added. At the conclusion of this workshop, patients will be able to understand the types of stress reactions, differentiate between acute and chronic stress, and recognize the impact that chronic stress has on their health. They will also be able to apply different coping mechanisms, such as reframing negative self-talk. Patients will have the opportunity to practice a variety of stress management techniques, such as deep abdominal breathing, progressive muscle relaxation, and/or guided imagery.  The purpose of this lesson is to educate patients on the role of stress in their lives and to provide healthy techniques for coping with it.  Learning Barriers/Preferences:  Learning Barriers/Preferences - 05/04/23 1510       Learning Barriers/Preferences   Learning Barriers Sight    Learning Preferences Individual Instruction;Group Instruction;Skilled Demonstration             Education Topics:  Knowledge Questionnaire Score:  Knowledge Questionnaire Score - 05/04/23 1509       Knowledge Questionnaire Score   Pre Score 17/24             Core Components/Risk Factors/Patient Goals at Admission:  Personal Goals and Risk Factors at Admission - 05/04/23 1509       Core Components/Risk Factors/Patient Goals on Admission   Diabetes Yes    Intervention Provide education about signs/symptoms and action to take for hypo/hyperglycemia.;Provide education about proper nutrition, including hydration, and aerobic/resistive exercise prescription along with prescribed medications to achieve blood glucose in normal ranges: Fasting glucose 65-99 mg/dL    Expected Outcomes Short Term:  Participant verbalizes understanding of the signs/symptoms and immediate care of hyper/hypoglycemia, proper foot care and importance of medication, aerobic/resistive exercise and nutrition plan for blood glucose control.;Long Term: Attainment of HbA1C < 7%.    Hypertension Yes    Intervention Provide education on lifestyle modifcations including regular physical activity/exercise, weight management, moderate sodium restriction and increased consumption of fresh fruit, vegetables, and low fat dairy, alcohol moderation, and smoking cessation.;Monitor prescription use compliance.    Expected Outcomes Short Term: Continued assessment and intervention until BP is < 140/24mm HG in hypertensive participants. < 130/39mm HG in hypertensive participants with diabetes, heart failure or chronic kidney disease.;Long Term: Maintenance of blood pressure at goal levels.    Lipids Yes    Intervention Provide education and support for participant on nutrition & aerobic/resistive exercise along with prescribed medications to achieve LDL 70mg , HDL >40mg .    Expected Outcomes Short Term: Participant states understanding of desired cholesterol values and is compliant with medications prescribed. Participant is following exercise prescription and nutrition guidelines.;Long Term: Cholesterol controlled with medications as prescribed, with individualized exercise RX and with personalized nutrition plan. Value goals: LDL < 70mg , HDL > 40 mg.             Core Components/Risk Factors/Patient Goals Review:   Goals and Risk Factor Review     Row Name 05/15/23 1426             Core Components/Risk Factors/Patient Goals Review   Personal Goals Review Weight Management/Obesity;Hypertension;Lipids;Diabetes       Review Frederick Knight started intensive cardiac  rehab on 05/15/23 and did well with exercise. Vital signs were stable.       Expected Outcomes Frederick Knight will continue to participate in intensive cardiac rehab for exercise,  nutrition and lifestyle modifications                Core Components/Risk Factors/Patient Goals at Discharge (Final Review):   Goals and Risk Factor Review - 05/15/23 1426       Core Components/Risk Factors/Patient Goals Review   Personal Goals Review Weight Management/Obesity;Hypertension;Lipids;Diabetes    Review Frederick Knight started intensive cardiac rehab on 05/15/23 and did well with exercise. Vital signs were stable.    Expected Outcomes Frederick Knight will continue to participate in intensive cardiac rehab for exercise, nutrition and lifestyle modifications             ITP Comments:  ITP Comments     Row Name 05/04/23 1345 05/15/23 1424         ITP Comments Armanda Magic, MD: Medical Director.  Introduction to the [facility] / Intensive Cardiac Rehab.  Initial orientation packet reviewed with the patient. 30 Day ITP review. Frederick Knight started intensive cardiac rehab on 05/15/23 and did well with exercise               Comments: See ITP comments.Thayer Headings RN BSN

## 2023-05-16 NOTE — Telephone Encounter (Signed)
Spoke with the patient who states that cardiac rehab was approved with his insurance through 6/28. He was told yesterday that he is going to need to continue cardiac rehab through 8/2. He is going to need a new authorization for the extended dates.

## 2023-05-16 NOTE — Telephone Encounter (Signed)
Patient is calling to discuss getting orders extended for cardiac rehab orders extended for insurance purposes. Requesting call back to get confirmation.

## 2023-05-17 ENCOUNTER — Encounter (HOSPITAL_COMMUNITY)
Admission: RE | Admit: 2023-05-17 | Discharge: 2023-05-17 | Disposition: A | Discharge status: Home / Self Care | Payer: Medicare Other | Source: Ambulatory Visit | Attending: Internal Medicine | Admitting: Internal Medicine

## 2023-05-17 DIAGNOSIS — Z955 Presence of coronary angioplasty implant and graft: Secondary | ICD-10-CM

## 2023-05-17 DIAGNOSIS — I214 Non-ST elevation (NSTEMI) myocardial infarction: Secondary | ICD-10-CM

## 2023-05-17 LAB — GLUCOSE, CAPILLARY
Glucose-Capillary: 102 mg/dL — ABNORMAL HIGH (ref 70–99)
Glucose-Capillary: 87 mg/dL (ref 70–99)

## 2023-05-19 ENCOUNTER — Other Ambulatory Visit: Payer: Self-pay | Admitting: Physician Assistant

## 2023-05-19 ENCOUNTER — Encounter (HOSPITAL_COMMUNITY)
Admission: RE | Admit: 2023-05-19 | Discharge: 2023-05-19 | Disposition: A | Discharge status: Home / Self Care | Payer: Medicare Other | Source: Ambulatory Visit | Attending: Internal Medicine | Admitting: Internal Medicine

## 2023-05-19 DIAGNOSIS — Z955 Presence of coronary angioplasty implant and graft: Secondary | ICD-10-CM

## 2023-05-19 DIAGNOSIS — I214 Non-ST elevation (NSTEMI) myocardial infarction: Secondary | ICD-10-CM

## 2023-05-22 ENCOUNTER — Encounter (HOSPITAL_COMMUNITY)
Admission: RE | Admit: 2023-05-22 | Discharge: 2023-05-22 | Disposition: A | Discharge status: Home / Self Care | Payer: Medicare Other | Source: Ambulatory Visit | Attending: Internal Medicine | Admitting: Internal Medicine

## 2023-05-22 DIAGNOSIS — Z955 Presence of coronary angioplasty implant and graft: Secondary | ICD-10-CM | POA: Diagnosis not present

## 2023-05-22 DIAGNOSIS — I214 Non-ST elevation (NSTEMI) myocardial infarction: Secondary | ICD-10-CM

## 2023-05-24 ENCOUNTER — Encounter (HOSPITAL_COMMUNITY)
Admission: RE | Admit: 2023-05-24 | Discharge: 2023-05-24 | Disposition: A | Discharge status: Home / Self Care | Payer: Medicare Other | Source: Ambulatory Visit | Attending: Internal Medicine | Admitting: Internal Medicine

## 2023-05-24 DIAGNOSIS — I214 Non-ST elevation (NSTEMI) myocardial infarction: Secondary | ICD-10-CM | POA: Diagnosis not present

## 2023-05-24 DIAGNOSIS — Z955 Presence of coronary angioplasty implant and graft: Secondary | ICD-10-CM | POA: Diagnosis not present

## 2023-05-25 ENCOUNTER — Telehealth: Payer: Self-pay | Admitting: Physician Assistant

## 2023-05-25 NOTE — Telephone Encounter (Signed)
Pt is requesting a phone call, concerning him waking up extremely dizzy and weak. Not having full control of body. After a stint being put in.

## 2023-05-26 ENCOUNTER — Encounter (HOSPITAL_COMMUNITY)
Admission: RE | Admit: 2023-05-26 | Discharge: 2023-05-26 | Disposition: A | Discharge status: Home / Self Care | Payer: Medicare Other | Source: Ambulatory Visit | Attending: Internal Medicine | Admitting: Internal Medicine

## 2023-05-26 DIAGNOSIS — I214 Non-ST elevation (NSTEMI) myocardial infarction: Secondary | ICD-10-CM

## 2023-05-26 DIAGNOSIS — Z955 Presence of coronary angioplasty implant and graft: Secondary | ICD-10-CM | POA: Diagnosis not present

## 2023-05-26 MED ORDER — METOPROLOL SUCCINATE ER 25 MG PO TB24: 25.0000 mg | ORAL_TABLET | Freq: Every day | ORAL | 3 refills | Status: DC

## 2023-05-26 NOTE — Telephone Encounter (Signed)
-----   Message from Orbie Pyo, MD sent at 05/26/2023  2:28 PM EDT ----- Thank for the note.  Letti Towell, let's decrease is Toprol to 25mg .  Thanks. ----- Message ----- From: Cammy Copa, RN Sent: 05/26/2023   2:17 PM EDT To: Macie Burows, RN; Orbie Pyo, MD  Good afternoon Dr Lynnette Caffey, Mr Tees reports that he feels lightheaded when he wakes up in the morning at home. Blood pressure post exercise today 92/60. Patient asymptomatic. Given water. Recheck blood pressure 101/71. Medications reviewed. Taking as prescribed. Sitting blood pressure 104/70. Standing blood pressure 102/70. I encouraged Mr Claudell Kyle to make sure he is drinking enough water. I didn't know if you could adjust his medications?  I appreciate your input,  Sincerely, [facility]  Cardiac Rehab

## 2023-05-26 NOTE — Telephone Encounter (Signed)
Called patient and informed.  He voices understanding and agreement. Medication list updated.

## 2023-05-26 NOTE — Telephone Encounter (Signed)
Left message for patient to call back  

## 2023-05-26 NOTE — Telephone Encounter (Signed)
Left message for the patient to call the clinic.

## 2023-05-26 NOTE — Telephone Encounter (Signed)
Spoke w the patient. He said cardiac rehab checked his BP yesterday.  First time was 98/, then recheck was 105/  they advised him to hydrate more.   Pt taking medications as per the medication list.     Last echo was 05/09/23 - EF was normal.  Will forward to Dr. Lynnette Caffey to see if any medication changes are recommended.

## 2023-05-26 NOTE — Progress Notes (Signed)
Frederick Knight reports that he feels lightheaded when he wakes up in the morning at home. Blood pressure post exercise today 92/60. Patient asymptomatic. Given water. Recheck blood pressure 101/71. Medications reviewed. Taking as prescribed. Sitting blood pressure 104/70. Standing blood pressure 102/70. Will forward today's vital signs to Dr Trula Ore office for review as Frederick Knight has had some resting systolic BP's. I encouraged Frederick Knight to make sure he is drinking enough water.Frederick Lighter, RN,BSN 05/26/2023 2:13 PM

## 2023-05-29 ENCOUNTER — Encounter (HOSPITAL_COMMUNITY)
Admission: RE | Admit: 2023-05-29 | Discharge: 2023-05-29 | Disposition: A | Discharge status: Home / Self Care | Payer: Medicare Other | Source: Ambulatory Visit | Attending: Internal Medicine | Admitting: Internal Medicine

## 2023-05-29 DIAGNOSIS — I214 Non-ST elevation (NSTEMI) myocardial infarction: Secondary | ICD-10-CM | POA: Diagnosis not present

## 2023-05-29 DIAGNOSIS — Z955 Presence of coronary angioplasty implant and graft: Secondary | ICD-10-CM

## 2023-05-29 LAB — GLUCOSE, CAPILLARY: Glucose-Capillary: 84 mg/dL (ref 70–99)

## 2023-05-31 ENCOUNTER — Encounter (HOSPITAL_COMMUNITY)
Admission: RE | Admit: 2023-05-31 | Discharge: 2023-05-31 | Disposition: A | Discharge status: Home / Self Care | Payer: Medicare Other | Source: Ambulatory Visit | Attending: Internal Medicine | Admitting: Internal Medicine

## 2023-05-31 DIAGNOSIS — I214 Non-ST elevation (NSTEMI) myocardial infarction: Secondary | ICD-10-CM | POA: Diagnosis not present

## 2023-05-31 DIAGNOSIS — Z955 Presence of coronary angioplasty implant and graft: Secondary | ICD-10-CM

## 2023-06-02 ENCOUNTER — Encounter (HOSPITAL_COMMUNITY)
Admission: RE | Admit: 2023-06-02 | Discharge: 2023-06-02 | Disposition: A | Discharge status: Home / Self Care | Payer: Medicare Other | Source: Ambulatory Visit | Attending: Internal Medicine | Admitting: Internal Medicine

## 2023-06-02 DIAGNOSIS — I214 Non-ST elevation (NSTEMI) myocardial infarction: Secondary | ICD-10-CM | POA: Diagnosis not present

## 2023-06-02 DIAGNOSIS — Z955 Presence of coronary angioplasty implant and graft: Secondary | ICD-10-CM

## 2023-06-04 ENCOUNTER — Other Ambulatory Visit: Payer: Self-pay | Admitting: Physician Assistant

## 2023-06-04 DIAGNOSIS — I214 Non-ST elevation (NSTEMI) myocardial infarction: Secondary | ICD-10-CM

## 2023-06-05 ENCOUNTER — Encounter (HOSPITAL_COMMUNITY)
Admission: RE | Admit: 2023-06-05 | Discharge: 2023-06-05 | Disposition: A | Discharge status: Home / Self Care | Payer: Medicare Other | Source: Ambulatory Visit | Attending: Internal Medicine | Admitting: Internal Medicine

## 2023-06-05 DIAGNOSIS — I214 Non-ST elevation (NSTEMI) myocardial infarction: Secondary | ICD-10-CM

## 2023-06-05 DIAGNOSIS — Z955 Presence of coronary angioplasty implant and graft: Secondary | ICD-10-CM

## 2023-06-05 NOTE — Telephone Encounter (Signed)
Eliquis 5mg  refill request received. Patient is 72 years old, weight-72.1kg, Crea-1.00 on 04/06/23, Diagnosis-NSTEMI with PCI-see below, and last seen by Jari Favre on 04/10/23. Dose is appropriate based on dosing criteria. Will send in refill to requested pharmacy.    Per 04/10/23 OV note  NSTEMI with PCI -No further chest pain -Continue current medication regimen which includes Eliquis 5 mg twice a day, aspirin 81 mg daily, Lipitor 80 mg daily, Plavix 75 mg daily, Jardiance 10 mg daily, losartan decreased to 12.5 mg daily, metoprolol succinate 37.5 mg daily -Triple therapy for 1 month and then just Eliquis and Plavix until echocardiogram results

## 2023-06-07 ENCOUNTER — Encounter (HOSPITAL_COMMUNITY)
Admission: RE | Admit: 2023-06-07 | Discharge: 2023-06-07 | Disposition: A | Discharge status: Home / Self Care | Payer: Medicare Other | Source: Ambulatory Visit | Attending: Internal Medicine | Admitting: Internal Medicine

## 2023-06-07 DIAGNOSIS — I214 Non-ST elevation (NSTEMI) myocardial infarction: Secondary | ICD-10-CM

## 2023-06-07 DIAGNOSIS — Z955 Presence of coronary angioplasty implant and graft: Secondary | ICD-10-CM | POA: Diagnosis not present

## 2023-06-09 ENCOUNTER — Encounter (HOSPITAL_COMMUNITY)
Admission: RE | Admit: 2023-06-09 | Discharge: 2023-06-09 | Disposition: A | Discharge status: Home / Self Care | Payer: Medicare Other | Source: Ambulatory Visit | Attending: Internal Medicine | Admitting: Internal Medicine

## 2023-06-09 DIAGNOSIS — Z955 Presence of coronary angioplasty implant and graft: Secondary | ICD-10-CM

## 2023-06-09 DIAGNOSIS — I214 Non-ST elevation (NSTEMI) myocardial infarction: Secondary | ICD-10-CM

## 2023-06-12 ENCOUNTER — Encounter (HOSPITAL_COMMUNITY)
Admission: RE | Admit: 2023-06-12 | Discharge: 2023-06-12 | Disposition: A | Discharge status: Home / Self Care | Payer: Medicare Other | Source: Ambulatory Visit | Attending: Family Medicine | Admitting: Family Medicine

## 2023-06-12 DIAGNOSIS — I252 Old myocardial infarction: Secondary | ICD-10-CM | POA: Diagnosis not present

## 2023-06-12 DIAGNOSIS — Z48812 Encounter for surgical aftercare following surgery on the circulatory system: Secondary | ICD-10-CM | POA: Insufficient documentation

## 2023-06-12 DIAGNOSIS — Z955 Presence of coronary angioplasty implant and graft: Secondary | ICD-10-CM | POA: Diagnosis not present

## 2023-06-12 DIAGNOSIS — I214 Non-ST elevation (NSTEMI) myocardial infarction: Secondary | ICD-10-CM

## 2023-06-14 ENCOUNTER — Encounter (HOSPITAL_COMMUNITY)
Admission: RE | Admit: 2023-06-14 | Discharge: 2023-06-14 | Disposition: A | Discharge status: Home / Self Care | Payer: Medicare Other | Source: Ambulatory Visit | Attending: Internal Medicine | Admitting: Internal Medicine

## 2023-06-14 DIAGNOSIS — Z955 Presence of coronary angioplasty implant and graft: Secondary | ICD-10-CM

## 2023-06-14 DIAGNOSIS — I214 Non-ST elevation (NSTEMI) myocardial infarction: Secondary | ICD-10-CM

## 2023-06-14 DIAGNOSIS — Z48812 Encounter for surgical aftercare following surgery on the circulatory system: Secondary | ICD-10-CM | POA: Diagnosis not present

## 2023-06-14 DIAGNOSIS — I252 Old myocardial infarction: Secondary | ICD-10-CM | POA: Diagnosis not present

## 2023-06-14 NOTE — Progress Notes (Signed)
Cardiac Individual Treatment Plan  Patient Details  Name: Frederick Knight MRN: [id number] Date of Birth: 1951-01-30 Referring Provider:   Flowsheet Row INTENSIVE CARDIAC REHAB ORIENT from 05/04/2023 in [facility]  Referring Provider Alverda Skeans, MD       Initial Encounter Date:  Flowsheet Row INTENSIVE CARDIAC REHAB ORIENT from 05/04/2023 in [facility] for Heart, Vascular, & Lung Health  Date 05/04/23       Visit Diagnosis: 03/28/23 NSTEMI (non-ST elevated myocardial infarction) (HCC)  03/29/23 S/P drug eluting coronary stent placement, LAD/ RCA  Patient's Home Medications on Admission:  Current Outpatient Medications:    acetaminophen (TYLENOL) 325 MG tablet, Take 2 tablets (650 mg total) by mouth every 4 (four) hours as needed for headache or mild pain., Disp: 30 tablet, Rfl: 1   apixaban (ELIQUIS) 5 MG TABS tablet, TAKE 1 TABLET BY MOUTH TWICE A DAY, Disp: 180 tablet, Rfl: 1   aspirin 81 MG chewable tablet, Chew 1 tablet (81 mg total) by mouth daily. (Patient not taking: Reported on 05/04/2023), Disp: 30 tablet, Rfl: 1   atorvastatin (LIPITOR) 80 MG tablet, Take 1 tablet (80 mg total) by mouth daily., Disp: 90 tablet, Rfl: 1   clopidogrel (PLAVIX) 75 MG tablet, TAKE 1 TABLET BY MOUTH EVERY DAY, Disp: 90 tablet, Rfl: 3   empagliflozin (JARDIANCE) 10 MG TABS tablet, TAKE 1 TABLET BY MOUTH EVERY DAY, Disp: 90 tablet, Rfl: 3   losartan (COZAAR) 25 MG tablet, Take 0.5 tablets (12.5 mg total) by mouth at bedtime., Disp: 45 tablet, Rfl: 3   metFORMIN (GLUCOPHAGE-XR) 500 MG 24 hr tablet, TAKE 1 TABLET BY MOUTH EVERY DAY WITH BREAKFAST (Patient taking differently: 500 mg daily with breakfast.), Disp: 90 tablet, Rfl: 1   metoprolol succinate (TOPROL XL) 25 MG 24 hr tablet, Take 1 tablet (25 mg total) by mouth daily., Disp: 90 tablet, Rfl: 3   tamsulosin (FLOMAX) 0.4 MG CAPS capsule, Take 1 capsule (0.4 mg  total) by mouth daily., Disp: 30 capsule, Rfl: 0  Past Medical History: Past Medical History:  Diagnosis Date   Diabetes mellitus without complication (HCC)    Hyperlipemia    Controlled well with medications    Tobacco Use: Social History   Tobacco Use  Smoking Status Never  Smokeless Tobacco Never    Labs: Review Flowsheet  More data exists      Latest Ref Rng & Units 06/07/2019 12/18/2020 01/04/2022 03/28/2023 03/29/2023  Labs for ITP Cardiac and Pulmonary Rehab  Cholestrol 0 - 200 mg/dL - - 409  - 811   LDL (calc) 0 - 99 mg/dL - - 84  - 53   HDL-C >91 mg/dL - - 44  - 45   Trlycerides <150 mg/dL - - 478  - 79   Hemoglobin A1c 4.8 - 5.6 % 6.6  5.6  6.0  6.1  -    Capillary Blood Glucose: Lab Results  Component Value Date   GLUCAP 84 05/29/2023   GLUCAP 102 (H) 05/17/2023   GLUCAP 87 05/17/2023   GLUCAP 114 (H) 05/15/2023   GLUCAP 87 05/15/2023     Exercise Target Goals: Exercise Program Goal: Individual exercise prescription set using results from initial 6 min walk test and THRR while considering  patient's activity barriers and safety.   Exercise Prescription Goal: Initial exercise prescription builds to 30-45 minutes a day of aerobic activity, 2-3 days per week.  Home exercise guidelines will be  given to patient during program as part of exercise prescription that the participant will acknowledge.  Activity Barriers & Risk Stratification:  Activity Barriers & Cardiac Risk Stratification - 05/04/23 1511       Activity Barriers & Cardiac Risk Stratification   Activity Barriers Balance Concerns    Cardiac Risk Stratification High             6 Minute Walk:  6 Minute Walk     Row Name 05/04/23 1430         6 Minute Walk   Phase Initial     Distance 1177 feet     Walk Time 6 minutes     # of Rest Breaks 0     MPH 2.23     METS 2.73     RPE 8     Perceived Dyspnea  0     VO2 Peak 9.6     Symptoms No     Resting HR 95 bpm     Resting BP 122/68      Resting Oxygen Saturation  96 %     Exercise Oxygen Saturation  during 6 min walk 96 %     Max Ex. HR 105 bpm     Max Ex. BP 124/60     2 Minute Post BP 108/60              Oxygen Initial Assessment:   Oxygen Re-Evaluation:   Oxygen Discharge (Final Oxygen Re-Evaluation):   Initial Exercise Prescription:  Initial Exercise Prescription - 05/04/23 1500       Date of Initial Exercise RX and Referring Provider   Date 05/04/23    Referring Provider Alverda Skeans, MD    Expected Discharge Date 07/14/23      NuStep   Level 2    SPM 75    Minutes 15    METs 2.7      Arm Ergometer   Level 1    Watts 25    RPM 60    Minutes 15    METs 2.7      Prescription Details   Frequency (times per week) 3    Duration Progress to 30 minutes of continuous aerobic without signs/symptoms of physical distress      Intensity   THRR 40-80% of Max Heartrate 59-118    Ratings of Perceived Exertion 11-13    Perceived Dyspnea 0-4      Progression   Progression Continue progressive overload as per policy without signs/symptoms or physical distress.      Resistance Training   Training Prescription Yes    Weight 3 lbs    Reps 10-15             Perform Capillary Blood Glucose checks as needed.  Exercise Prescription Changes:   Exercise Prescription Changes     Row Name 05/15/23 1400 05/31/23 1400 06/07/23 1400         Response to Exercise   Blood Pressure (Admit) 114/68 110/58 100/56     Blood Pressure (Exercise) 150/70 138/74 146/66     Blood Pressure (Exit) 104/70 98/60 96/56      Heart Rate (Admit) 71 bpm 73 bpm 85 bpm     Heart Rate (Exercise) 122 bpm 123 bpm 129 bpm     Heart Rate (Exit) 80 bpm 81 bpm 94 bpm     Rating of Perceived Exertion (Exercise) 12 12 12      Symptoms None None None     Comments Pt's  first day in the CRP2 program Reviewed METs Reviewed METs and goals     Duration Continue with 30 min of aerobic exercise without signs/symptoms of  physical distress. Continue with 30 min of aerobic exercise without signs/symptoms of physical distress. Continue with 30 min of aerobic exercise without signs/symptoms of physical distress.     Intensity THRR unchanged THRR unchanged THRR unchanged       Progression   Progression Continue to progress workloads to maintain intensity without signs/symptoms of physical distress. Continue to progress workloads to maintain intensity without signs/symptoms of physical distress. Continue to progress workloads to maintain intensity without signs/symptoms of physical distress.     Average METs 2.75 3.1 3.9       Resistance Training   Training Prescription Yes No No     Weight 3 lbs No weights on wednesdays No weights on wednesdays     Reps 10-15 -- --     Time 10 Minutes -- --       Interval Training   Interval Training No No No       Recumbant Bike   Level 1 2 3      RPM 76 85 82     Watts 25 26 44     Minutes 15 15 5      METs 2.2 3 4        NuStep   Level 2 2 2      SPM 91 102 111     Minutes 15 15 15      METs 3.3 3.2 3.8              Exercise Comments:   Exercise Comments     Row Name 05/15/23 1443 05/31/23 1425 06/07/23 1558       Exercise Comments Pt's first day in the CRP2 program. Pt exercised without complaints. Reviewed METs. Pt is progressing on his MET levels. Pt will increase workload on nustep next session. Reviewed METs and goals. Pt voices imrpoved stregth and stamina which is a goal.              Exercise Goals and Review:   Exercise Goals     Row Name 05/04/23 1511             Exercise Goals   Increase Physical Activity Yes       Intervention Provide advice, education, support and counseling about physical activity/exercise needs.;Develop an individualized exercise prescription for aerobic and resistive training based on initial evaluation findings, risk stratification, comorbidities and participant's personal goals.       Expected Outcomes Short  Term: Attend rehab on a regular basis to increase amount of physical activity.;Long Term: Add in home exercise to make exercise part of routine and to increase amount of physical activity.;Long Term: Exercising regularly at least 3-5 days a week.       Increase Strength and Stamina Yes       Intervention Provide advice, education, support and counseling about physical activity/exercise needs.;Develop an individualized exercise prescription for aerobic and resistive training based on initial evaluation findings, risk stratification, comorbidities and participant's personal goals.       Expected Outcomes Short Term: Increase workloads from initial exercise prescription for resistance, speed, and METs.;Short Term: Perform resistance training exercises routinely during rehab and add in resistance training at home;Long Term: Improve cardiorespiratory fitness, muscular endurance and strength as measured by increased METs and functional capacity ( )       Able to understand and use rate of perceived exertion (  RPE) scale Yes       Intervention Provide education and explanation on how to use RPE scale       Expected Outcomes Short Term: Able to use RPE daily in rehab to express subjective intensity level;Long Term:  Able to use RPE to guide intensity level when exercising independently       Knowledge and understanding of Target Heart Rate Range (THRR) Yes       Intervention Provide education and explanation of THRR including how the numbers were predicted and where they are located for reference       Expected Outcomes Short Term: Able to state/look up THRR;Long Term: Able to use THRR to govern intensity when exercising independently;Short Term: Able to use daily as guideline for intensity in rehab       Understanding of Exercise Prescription Yes       Intervention Provide education, explanation, and written materials on patient's individual exercise prescription       Expected Outcomes Short Term: Able to  explain program exercise prescription;Long Term: Able to explain home exercise prescription to exercise independently                Exercise Goals Re-Evaluation :  Exercise Goals Re-Evaluation     Row Name 05/15/23 1442 06/07/23 1444           Exercise Goal Re-Evaluation   Exercise Goals Review Increase Physical Activity;Increase Strength and Stamina;Able to understand and use rate of perceived exertion (RPE) scale;Knowledge and understanding of Target Heart Rate Range (THRR);Understanding of Exercise Prescription Increase Physical Activity;Increase Strength and Stamina;Able to understand and use rate of perceived exertion (RPE) scale;Knowledge and understanding of Target Heart Rate Range (THRR);Understanding of Exercise Prescription      Comments Pt's first day in the CRP2 program. Pt understnads the exercise Rx, RPE scale and THRR. Reviewed METs and goals.      Expected Outcomes Will continue to monitor the patient and progress exercise workloads as tolerated. Will continue to monitor the patient and progress exercise workloads as tolerated.               Discharge Exercise Prescription (Final Exercise Prescription Changes):  Exercise Prescription Changes - 06/07/23 1400       Response to Exercise   Blood Pressure (Admit) 100/56    Blood Pressure (Exercise) 146/66    Blood Pressure (Exit) 96/56    Heart Rate (Admit) 85 bpm    Heart Rate (Exercise) 129 bpm    Heart Rate (Exit) 94 bpm    Rating of Perceived Exertion (Exercise) 12    Symptoms None    Comments Reviewed METs and goals    Duration Continue with 30 min of aerobic exercise without signs/symptoms of physical distress.    Intensity THRR unchanged      Progression   Progression Continue to progress workloads to maintain intensity without signs/symptoms of physical distress.    Average METs 3.9      Resistance Training   Training Prescription No    Weight No weights on wednesdays      Interval Training    Interval Training No      Recumbant Bike   Level 3    RPM 82    Watts 44    Minutes 5    METs 4      NuStep   Level 2    SPM 111    Minutes 15    METs 3.8  Nutrition:  Target Goals: Understanding of nutrition guidelines, daily intake of sodium 1500mg , cholesterol 200mg , calories 30% from fat and 7% or less from saturated fats, daily to have 5 or more servings of fruits and vegetables.  Biometrics:  Pre Biometrics - 05/04/23 1355       Pre Biometrics   Waist Circumference 38 inches    Hip Circumference 39 inches    Waist to Hip Ratio 0.97 %    Triceps Skinfold 9 mm    Grip Strength 32 kg    Flexibility 14 in    Single Leg Stand 6.08 seconds             Post Biometrics - 05/04/23 1355        Post  Biometrics   % Body Fat 23.5 %             Nutrition Therapy Plan and Nutrition Goals:  Nutrition Therapy & Goals - 06/13/23 1633       Nutrition Therapy   Diet Heart healthy diet    Drug/Food Interactions Statins/Certain Fruits      Personal Nutrition Goals   Nutrition Goal Patient to identify strategies for reducing cardiovascular risk by attending the Pritikin education and nutrition series weekly.    Personal Goal #2 Patient to improve diet quality by using the plate method as a guide for meal planning to include lean protein/plant protein, fruits, vegetables, whole grains, nonfat dairy as part of a well-balanced diet.    Comments Frederick Knight declines nutrition education at this time. Patient's A1c is well controlled. He is down 7.9# since starting with our program. Patient will benefit from participation in intensive cardiac rehab for nutrition, exercise, and lifestyle modification.      Intervention Plan   Intervention Prescribe, educate and counsel regarding individualized specific dietary modifications aiming towards targeted core components such as weight, hypertension, lipid management, diabetes, heart failure and other  comorbidities.;Nutrition handout(s) given to patient.    Expected Outcomes Short Term Goal: Understand basic principles of dietary content, such as calories, fat, sodium, cholesterol and nutrients.;Long Term Goal: Adherence to prescribed nutrition plan.             Nutrition Assessments:  Nutrition Assessments - 05/15/23 1424       Rate Your Plate Scores   Pre Score 73            MEDIFICTS Score Key: ?70 Need to make dietary changes  40-70 Heart Healthy Diet ? 40 Therapeutic Level Cholesterol Diet   Flowsheet Row INTENSIVE CARDIAC REHAB from 05/15/2023 in [facility], V[facility], & Lung [facility]  Picture Your Plate Total Score on Admission 73      Picture Your Plate Scores: <82 Unhealthy dietary pattern with much room for improvement. 41-50 Dietary pattern unlikely to meet recommendations for good health and room for improvement. 51-60 More healthful dietary pattern, with some room for improvement.  >60 Healthy dietary pattern, although there may be some specific behaviors that could be improved.    Nutrition Goals Re-Evaluation:  Nutrition Goals Re-Evaluation     Row Name 05/15/23 1338 06/13/23 1633           Goals   Current Weight 158 lb 15.2 oz (72.1 kg) 151 lb 0.2 oz (68.5 kg)      Comment lipids WNL, Lipoprotein A WNL, A1c 6.1 no new labs; most recent labs lipids WNL, Lipoprotein A WNL, A1c 6.1      Expected Outcome Patient's A1c is well  controlled. Patient will benefit from participation in intensive cardiac rehab for nutrition, exercise, and lifestyle modification. Frederick Knight declines nutrition education at this time. Patient's A1c is well controlled. He is down 7.9# since starting with our program. Patient will benefit from participation in intensive cardiac rehab for nutrition, exercise, and lifestyle modification.               Nutrition Goals Re-Evaluation:  Nutrition Goals Re-Evaluation     Row Name 05/15/23 1338 06/13/23  1633           Goals   Current Weight 158 lb 15.2 oz (72.1 kg) 151 lb 0.2 oz (68.5 kg)      Comment lipids WNL, Lipoprotein A WNL, A1c 6.1 no new labs; most recent labs lipids WNL, Lipoprotein A WNL, A1c 6.1      Expected Outcome Patient's A1c is well controlled. Patient will benefit from participation in intensive cardiac rehab for nutrition, exercise, and lifestyle modification. Frederick Knight declines nutrition education at this time. Patient's A1c is well controlled. He is down 7.9# since starting with our program. Patient will benefit from participation in intensive cardiac rehab for nutrition, exercise, and lifestyle modification.               Nutrition Goals Discharge (Final Nutrition Goals Re-Evaluation):  Nutrition Goals Re-Evaluation - 06/13/23 1633       Goals   Current Weight 151 lb 0.2 oz (68.5 kg)    Comment no new labs; most recent labs lipids WNL, Lipoprotein A WNL, A1c 6.1    Expected Outcome Frederick Knight declines nutrition education at this time. Patient's A1c is well controlled. He is down 7.9# since starting with our program. Patient will benefit from participation in intensive cardiac rehab for nutrition, exercise, and lifestyle modification.             Psychosocial: Target Goals: Acknowledge presence or absence of significant depression and/or stress, maximize coping skills, provide positive support system. Participant is able to verbalize types and ability to use techniques and skills needed for reducing stress and depression.  Initial Review & Psychosocial Screening:  Initial Psych Review & Screening - 05/04/23 1506       Initial Review   Current issues with None Identified      Family Dynamics   Good Support System? Yes   Has mother, sister and daughter for support     Barriers   Psychosocial barriers to participate in program There are no identifiable barriers or psychosocial needs.      Screening Interventions   Interventions Encouraged to exercise              Quality of Life Scores:  Quality of Life - 05/04/23 1532       Quality of Life   Select Quality of Life      Quality of Life Scores   Health/Function Pre 28.77 %    Socioeconomic Pre 29.06 %    Psych/Spiritual Pre 30 %    Family Pre 27.6 %    GLOBAL Pre 28.91 %            Scores of 19 and below usually indicate a poorer quality of life in these areas.  A difference of  2-3 points is a clinically meaningful difference.  A difference of 2-3 points in the total score of the Quality of Life Index has been associated with significant improvement in overall quality of life, self-image, physical symptoms, and general health in studies assessing change in quality of life.  PHQ-9: Review Flowsheet  More data exists      05/04/2023 04/06/2023 12/15/2022 04/14/2022 02/24/2022  Depression screen PHQ 2/9  Decreased Interest 0 0 0 0 0  Down, Depressed, Hopeless 0 0 0 0 0  PHQ - 2 Score 0 0 0 0 0  Altered sleeping 0 2 0 0 0  Tired, decreased energy 1 2 0 0 0  Change in appetite 0 0 0 0 0  Feeling bad or failure about yourself  0 0 0 0 0  Trouble concentrating 0 0 0 0 0  Moving slowly or fidgety/restless 0 0 0 0 0  Suicidal thoughts 0 0 0 0 0  PHQ-9 Score 1 4 0 0 0  Difficult doing work/chores Not difficult at all Somewhat difficult Not difficult at all Not difficult at all -   Interpretation of Total Score  Total Score Depression Severity:  1-4 = Minimal depression, 5-9 = Mild depression, 10-14 = Moderate depression, 15-19 = Moderately severe depression, 20-27 = Severe depression   Psychosocial Evaluation and Intervention:   Psychosocial Re-Evaluation:  Psychosocial Re-Evaluation     Row Name 05/15/23 1425 06/14/23 1055           Psychosocial Re-Evaluation   Current issues with None Identified None Identified      Interventions Encouraged to attend Cardiac Rehabilitation for the exercise Encouraged to attend Cardiac Rehabilitation for the exercise      Continue  Psychosocial Services  No Follow up required No Follow up required               Psychosocial Discharge (Final Psychosocial Re-Evaluation):  Psychosocial Re-Evaluation - 06/14/23 1055       Psychosocial Re-Evaluation   Current issues with None Identified    Interventions Encouraged to attend Cardiac Rehabilitation for the exercise    Continue Psychosocial Services  No Follow up required             Vocational Rehabilitation: Provide vocational rehab assistance to qualifying candidates.   Vocational Rehab Evaluation & Intervention:  Vocational Rehab - 05/04/23 1506       Initial Vocational Rehab Evaluation & Intervention   Assessment shows need for Vocational Rehabilitation No   No needs, pt back to work as an Runner, broadcasting/film/video            Education: Education Goals: Education classes will be provided on a weekly basis, covering required topics. Participant will state understanding/return demonstration of topics presented.     Core Videos: Exercise    Move It!  Clinical staff conducted group or individual video education with verbal and written material and guidebook.  Patient learns the recommended Pritikin exercise program. Exercise with the goal of living a long, healthy life. Some of the health benefits of exercise include controlled diabetes, healthier blood pressure levels, improved cholesterol levels, improved heart and lung capacity, improved sleep, and better body composition. Everyone should speak with their doctor before starting or changing an exercise routine.  Biomechanical Limitations Clinical staff conducted group or individual video education with verbal and written material and guidebook.  Patient learns how biomechanical limitations can impact exercise and how we can mitigate and possibly overcome limitations to have an impactful and balanced exercise routine.  Body Composition Clinical staff conducted group or individual video education with verbal and  written material and guidebook.  Patient learns that body composition (ratio of muscle mass to fat mass) is a key component to assessing overall fitness, rather than body weight alone. Increased fat mass,  especially visceral belly fat, can put [place] at increased risk for metabolic syndrome, type 2 diabetes, heart disease, and even death. It is recommended to combine diet and exercise (cardiovascular and resistance training) to improve your body composition. Seek guidance from your physician and exercise physiologist before implementing an exercise routine.  Exercise Action Plan Clinical staff conducted group or individual video education with verbal and written material and guidebook.  Patient learns the recommended strategies to achieve and enjoy long-term exercise adherence, including variety, self-motivation, self-efficacy, and positive decision making. Benefits of exercise include fitness, good health, weight management, more energy, better sleep, less stress, and overall well-being.  Medical   Heart Disease Risk Reduction Clinical staff conducted group or individual video education with verbal and written material and guidebook.  Patient learns our heart is our most vital organ as it circulates oxygen, nutrients, white blood cells, and hormones throughout the entire body, and carries waste away. Data supports a plant-based eating plan like the Pritikin Program for its effectiveness in slowing progression of and reversing heart disease. The video provides a number of recommendations to address heart disease.   Metabolic Syndrome and Belly Fat  Clinical staff conducted group or individual video education with verbal and written material and guidebook.  Patient learns what metabolic syndrome is, how it leads to heart disease, and how one can reverse it and keep it from coming back. You have metabolic syndrome if you have 3 of the following 5 criteria: abdominal obesity, high blood pressure, high  triglycerides, low HDL cholesterol, and high blood sugar.  Hypertension and Heart Disease Clinical staff conducted group or individual video education with verbal and written material and guidebook.  Patient learns that high blood pressure, or hypertension, is very common in the [place]. Hypertension is largely due to excessive salt intake, but other important risk factors include being overweight, physical inactivity, drinking too much alcohol, smoking, and not eating enough potassium from fruits and vegetables. High blood pressure is a leading risk factor for heart attack, stroke, congestive heart failure, dementia, kidney failure, and premature death. Long-term effects of excessive salt intake include stiffening of the arteries and thickening of heart muscle and organ damage. Recommendations include ways to reduce hypertension and the risk of heart disease.  Diseases of Our Time - Focusing on Diabetes Clinical staff conducted group or individual video education with verbal and written material and guidebook.  Patient learns why the best way to stop diseases of our time is prevention, through food and other lifestyle changes. Medicine (such as prescription pills and surgeries) is often only a Band-Aid on the problem, not a long-term solution. Most common diseases of our time include obesity, type 2 diabetes, hypertension, heart disease, and cancer. The Pritikin Program is recommended and has been proven to help reduce, reverse, and/or prevent the damaging effects of metabolic syndrome.  Nutrition   Overview of the Pritikin Eating Plan  Clinical staff conducted group or individual video education with verbal and written material and guidebook.  Patient learns about the Pritikin Eating Plan for disease risk reduction. The Pritikin Eating Plan emphasizes a wide variety of unrefined, minimally-processed carbohydrates, like fruits, vegetables, whole grains, and legumes. Go, Caution, and Stop food  choices are explained. Plant-based and lean animal proteins are emphasized. Rationale provided for low sodium intake for blood pressure control, low added sugars for blood sugar stabilization, and low added fats and oils for coronary artery disease risk reduction and weight management.  Calorie Density  Clinical staff  conducted group or individual video education with verbal and written material and guidebook.  Patient learns about calorie density and how it impacts the Pritikin Eating Plan. Knowing the characteristics of the food you choose will help you decide whether those foods will lead to weight gain or weight loss, and whether you want to consume more or less of them. Weight loss is usually a side effect of the Pritikin Eating Plan because of its focus on low calorie-dense foods.  Label Reading  Clinical staff conducted group or individual video education with verbal and written material and guidebook.  Patient learns about the Pritikin recommended label reading guidelines and corresponding recommendations regarding calorie density, added sugars, sodium content, and whole grains.  Dining Out - Part 1  Clinical staff conducted group or individual video education with verbal and written material and guidebook.  Patient learns that restaurant meals can be sabotaging because they can be so high in calories, fat, sodium, and/or sugar. Patient learns recommended strategies on how to positively address this and avoid unhealthy pitfalls.  Facts on Fats  Clinical staff conducted group or individual video education with verbal and written material and guidebook.  Patient learns that lifestyle modifications can be just as effective, if not more so, as many medications for lowering your risk of heart disease. A Pritikin lifestyle can help to reduce your risk of inflammation and atherosclerosis (cholesterol build-up, or plaque, in the artery walls). Lifestyle interventions such as dietary choices and  physical activity address the cause of atherosclerosis. A review of the types of fats and their impact on blood cholesterol levels, along with dietary recommendations to reduce fat intake is also included.  Nutrition Action Plan  Clinical staff conducted group or individual video education with verbal and written material and guidebook.  Patient learns how to incorporate Pritikin recommendations into their lifestyle. Recommendations include planning and keeping personal health goals in mind as an important part of their success.  Healthy Mind-Set    Healthy Minds, Bodies, Hearts  Clinical staff conducted group or individual video education with verbal and written material and guidebook.  Patient learns how to identify when they are stressed. Video will discuss the impact of that stress, as well as the many benefits of stress management. Patient will also be introduced to stress management techniques. The way we think, act, and feel has an impact on our hearts.  How Our Thoughts Can Heal Our Hearts  Clinical staff conducted group or individual video education with verbal and written material and guidebook.  Patient learns that negative thoughts can cause depression and anxiety. This can result in negative lifestyle behavior and serious health problems. Cognitive behavioral therapy is an effective method to help control our thoughts in order to change and improve our emotional outlook.  Additional Videos:  Exercise    Improving Performance  Clinical staff conducted group or individual video education with verbal and written material and guidebook.  Patient learns to use a non-linear approach by alternating intensity levels and lengths of time spent exercising to help burn more calories and lose more body fat. Cardiovascular exercise helps improve heart health, metabolism, hormonal balance, blood sugar control, and recovery from fatigue. Resistance training improves strength, endurance, balance,  coordination, reaction time, metabolism, and muscle mass. Flexibility exercise improves circulation, posture, and balance. Seek guidance from your physician and exercise physiologist before implementing an exercise routine and learn your capabilities and proper form for all exercise.  Introduction to Yoga  Clinical staff conducted group or  individual video education with verbal and written material and guidebook.  Patient learns about yoga, a discipline of the coming together of mind, breath, and body. The benefits of yoga include improved flexibility, improved range of motion, better posture and core strength, increased lung function, weight loss, and positive self-image. Yoga's heart health benefits include lowered blood pressure, healthier heart rate, decreased cholesterol and triglyceride levels, improved immune function, and reduced stress. Seek guidance from your physician and exercise physiologist before implementing an exercise routine and learn your capabilities and proper form for all exercise.  Medical   Aging: Enhancing Your Quality of Life  Clinical staff conducted group or individual video education with verbal and written material and guidebook.  Patient learns key strategies and recommendations to stay in good physical health and enhance quality of life, such as prevention strategies, having an advocate, securing a Health Care Proxy and Power of Attorney, and keeping a list of medications and system for tracking them. It also discusses how to avoid risk for bone loss.  Biology of Weight Control  Clinical staff conducted group or individual video education with verbal and written material and guidebook.  Patient learns that weight gain occurs because we consume more calories than we burn (eating more, moving less). Even if your body weight is normal, you may have higher ratios of fat compared to muscle mass. Too much body fat puts you at increased risk for cardiovascular disease, heart  attack, stroke, type 2 diabetes, and obesity-related cancers. In addition to exercise, following the Pritikin Eating Plan can help reduce your risk.  Decoding Lab Results  Clinical staff conducted group or individual video education with verbal and written material and guidebook.  Patient learns that lab test reflects one measurement whose values change over time and are influenced by many factors, including medication, stress, sleep, exercise, food, hydration, pre-existing medical conditions, and more. It is recommended to use the knowledge from this video to become more involved with your lab results and evaluate your numbers to speak with your doctor.   Diseases of Our Time - Overview  Clinical staff conducted group or individual video education with verbal and written material and guidebook.  Patient learns that according to the CDC, 50% to 70% of chronic diseases (such as obesity, type 2 diabetes, elevated lipids, hypertension, and heart disease) are avoidable through lifestyle improvements including healthier food choices, listening to satiety cues, and increased physical activity.  Sleep Disorders Clinical staff conducted group or individual video education with verbal and written material and guidebook.  Patient learns how good quality and duration of sleep are important to overall health and well-being. Patient also learns about sleep disorders and how they impact health along with recommendations to address them, including discussing with a physician.  Nutrition  Dining Out - Part 2 Clinical staff conducted group or individual video education with verbal and written material and guidebook.  Patient learns how to plan ahead and communicate in order to maximize their dining experience in a healthy and nutritious manner. Included are recommended food choices based on the type of restaurant the patient is visiting.   Fueling a Banker conducted group or individual  video education with verbal and written material and guidebook.  There is a strong connection between our food choices and our health. Diseases like obesity and type 2 diabetes are very prevalent and are in large-part due to lifestyle choices. The Pritikin Eating Plan provides plenty of food and hunger-curbing satisfaction. It is  easy to follow, affordable, and helps reduce health risks.  Menu Workshop  Clinical staff conducted group or individual video education with verbal and written material and guidebook.  Patient learns that restaurant meals can sabotage health goals because they are often packed with calories, fat, sodium, and sugar. Recommendations include strategies to plan ahead and to communicate with the manager, chef, or server to help order a healthier meal.  Planning Your Eating Strategy  Clinical staff conducted group or individual video education with verbal and written material and guidebook.  Patient learns about the Pritikin Eating Plan and its benefit of reducing the risk of disease. The Pritikin Eating Plan does not focus on calories. Instead, it emphasizes high-quality, nutrient-rich foods. By knowing the characteristics of the foods, we choose, we can determine their calorie density and make informed decisions.  Targeting Your Nutrition Priorities  Clinical staff conducted group or individual video education with verbal and written material and guidebook.  Patient learns that lifestyle habits have a tremendous impact on disease risk and progression. This video provides eating and physical activity recommendations based on your personal health goals, such as reducing LDL cholesterol, losing weight, preventing or controlling type 2 diabetes, and reducing high blood pressure.  Vitamins and Minerals  Clinical staff conducted group or individual video education with verbal and written material and guidebook.  Patient learns different ways to obtain key vitamins and minerals,  including through a recommended healthy diet. It is important to discuss all supplements you take with your doctor.   Healthy Mind-Set    Smoking Cessation  Clinical staff conducted group or individual video education with verbal and written material and guidebook.  Patient learns that cigarette smoking and tobacco addiction pose a serious health risk which affects millions of people. Stopping smoking will significantly reduce the risk of heart disease, lung disease, and many forms of cancer. Recommended strategies for quitting are covered, including working with your doctor to develop a successful plan.  Culinary   Becoming a Set designer conducted group or individual video education with verbal and written material and guidebook.  Patient learns that cooking at home can be healthy, cost-effective, quick, and puts them in control. Keys to cooking healthy recipes will include looking at your recipe, assessing your equipment needs, planning ahead, making it simple, choosing cost-effective seasonal ingredients, and limiting the use of added fats, salts, and sugars.  Cooking - Breakfast and Snacks  Clinical staff conducted group or individual video education with verbal and written material and guidebook.  Patient learns how important breakfast is to satiety and nutrition through the entire day. Recommendations include key foods to eat during breakfast to help stabilize blood sugar levels and to prevent overeating at meals later in the day. Planning ahead is also a key component.  Cooking - Educational psychologist conducted group or individual video education with verbal and written material and guidebook.  Patient learns eating strategies to improve overall health, including an approach to cook more at home. Recommendations include thinking of animal protein as a side on your plate rather than center stage and focusing instead on lower calorie dense options like vegetables,  fruits, whole grains, and plant-based proteins, such as beans. Making sauces in large quantities to freeze for later and leaving the skin on your vegetables are also recommended to maximize your experience.  Cooking - Healthy Salads and Dressing Clinical staff conducted group or individual video education with verbal and written material and guidebook.  Patient learns that vegetables, fruits, whole grains, and legumes are the foundations of the Pritikin Eating Plan. Recommendations include how to incorporate each of these in flavorful and healthy salads, and how to create homemade salad dressings. Proper handling of ingredients is also covered. Cooking - Soups and [employer] - Soups and Desserts Clinical staff conducted group or individual video education with verbal and written material and guidebook.  Patient learns that Pritikin soups and desserts make for easy, nutritious, and delicious snacks and meal components that are low in sodium, fat, sugar, and calorie density, while high in vitamins, minerals, and filling fiber. Recommendations include simple and healthy ideas for soups and desserts.   Overview     The Pritikin Solution Program Overview Clinical staff conducted group or individual video education with verbal and written material and guidebook.  Patient learns that the results of the Pritikin Program have been documented in more than 100 articles published in peer-reviewed journals, and the benefits include reducing risk factors for (and, in some cases, even reversing) high cholesterol, high blood pressure, type 2 diabetes, obesity, and more! An overview of the three key pillars of the Pritikin Program will be covered: eating well, doing regular exercise, and having a healthy mind-set.  WORKSHOPS  Exercise: Exercise Basics: Building Your Action Plan Clinical staff led group instruction and group discussion with PowerPoint presentation and patient guidebook. To enhance the  learning environment the use of posters, models and videos may be added. At the conclusion of this workshop, patients will comprehend the difference between physical activity and exercise, as well as the benefits of incorporating both, into their routine. Patients will understand the FITT (Frequency, Intensity, Time, and Type) principle and how to use it to build an exercise action plan. In addition, safety concerns and other considerations for exercise and cardiac rehab will be addressed by the presenter. The purpose of this lesson is to promote a comprehensive and effective weekly exercise routine in order to improve patients' overall level of fitness.   Managing Heart Disease: Your Path to a Healthier Heart Clinical staff led group instruction and group discussion with PowerPoint presentation and patient guidebook. To enhance the learning environment the use of posters, models and videos may be added.At the conclusion of this workshop, patients will understand the anatomy and physiology of the heart. Additionally, they will understand how Pritikin's three pillars impact the risk factors, the progression, and the management of heart disease.  The purpose of this lesson is to provide a high-level overview of the heart, heart disease, and how the Pritikin lifestyle positively impacts risk factors.  Exercise Biomechanics Clinical staff led group instruction and group discussion with PowerPoint presentation and patient guidebook. To enhance the learning environment the use of posters, models and videos may be added. Patients will learn how the structural parts of their bodies function and how these functions impact their daily activities, movement, and exercise. Patients will learn how to promote a neutral spine, learn how to manage pain, and identify ways to improve their physical movement in order to promote healthy living. The purpose of this lesson is to expose patients to common  physical limitations that impact physical activity. Participants will learn practical ways to adapt and manage aches and pains, and to minimize their effect on regular exercise. Patients will learn how to maintain good posture while sitting, walking, and lifting.  Balance Training and Fall Prevention  Clinical staff led group instruction and group discussion with PowerPoint presentation and patient guidebook.  To enhance the learning environment the use of posters, models and videos may be added. At the conclusion of this workshop, patients will understand the importance of their sensorimotor skills (vision, proprioception, and the vestibular system) in maintaining their ability to balance as they age. Patients will apply a variety of balancing exercises that are appropriate for their current level of function. Patients will understand the common causes for poor balance, possible solutions to these problems, and ways to modify their physical environment in order to minimize their fall risk. The purpose of this lesson is to teach patients about the importance of maintaining balance as they age and ways to minimize their risk of falling.  WORKSHOPS   Nutrition:  Fueling a Ship broker led group instruction and group discussion with PowerPoint presentation and patient guidebook. To enhance the learning environment the use of posters, models and videos may be added. Patients will review the foundational principles of the Pritikin Eating Plan and understand what constitutes a serving size in each of the food groups. Patients will also learn Pritikin-friendly foods that are better choices when away from home and review make-ahead meal and snack options. Calorie density will be reviewed and applied to three nutrition priorities: weight maintenance, weight loss, and weight gain. The purpose of this lesson is to reinforce (in a group setting) the key concepts around what patients are  recommended to eat and how to apply these guidelines when away from home by planning and selecting Pritikin-friendly options. Patients will understand how calorie density may be adjusted for different weight management goals.  Mindful Eating  Clinical staff led group instruction and group discussion with PowerPoint presentation and patient guidebook. To enhance the learning environment the use of posters, models and videos may be added. Patients will briefly review the concepts of the Pritikin Eating Plan and the importance of low-calorie dense foods. The concept of mindful eating will be introduced as well as the importance of paying attention to internal hunger signals. Triggers for non-hunger eating and techniques for dealing with triggers will be explored. The purpose of this lesson is to provide patients with the opportunity to review the basic principles of the Pritikin Eating Plan, discuss the value of eating mindfully and how to measure internal cues of hunger and fullness using the Hunger Scale. Patients will also discuss reasons for non-hunger eating and learn strategies to use for controlling emotional eating.  Targeting Your Nutrition Priorities Clinical staff led group instruction and group discussion with PowerPoint presentation and patient guidebook. To enhance the learning environment the use of posters, models and videos may be added. Patients will learn how to determine their genetic susceptibility to disease by reviewing their family history. Patients will gain insight into the importance of diet as part of an overall healthy lifestyle in mitigating the impact of genetics and other environmental insults. The purpose of this lesson is to provide patients with the opportunity to assess their personal nutrition priorities by looking at their family history, their own health history and current risk factors. Patients will also be able to discuss ways of prioritizing and modifying the Pritikin  Eating Plan for their highest risk areas  Menu  Clinical staff led group instruction and group discussion with PowerPoint presentation and patient guidebook. To enhance the learning environment the use of posters, models and videos may be added. Using menus brought in from [employer], or printed from [employer], patients will apply the Pritikin dining out guidelines that were presented in  the [employer] video. Patients will also be able to practice these guidelines in a variety of provided scenarios. The purpose of this lesson is to provide patients with the opportunity to practice hands-on learning of the Pritikin Dining Out guidelines with actual menus and practice scenarios.  Label Reading Clinical staff led group instruction and group discussion with PowerPoint presentation and patient guidebook. To enhance the learning environment the use of posters, models and videos may be added. Patients will review and discuss the Pritikin label reading guidelines presented in Pritikin's Label Reading Educational series video. Using fool labels brought in from local grocery stores and markets, patients will apply the label reading guidelines and determine if the packaged food meet the Pritikin guidelines. The purpose of this lesson is to provide patients with the opportunity to review, discuss, and practice hands-on learning of the Pritikin Label Reading guidelines with actual packaged food labels. Cooking School  Pritikin's [employer] are designed to teach patients ways to prepare quick, simple, and affordable recipes at home. The importance of nutrition's role in chronic disease risk reduction is reflected in its emphasis in the overall Pritikin program. By learning how to prepare essential core Pritikin Eating Plan recipes, patients will increase control over what they eat; be able to customize the flavor of foods without the use of added salt, sugar, or fat; and  improve the quality of the food they consume. By learning a set of core recipes which are easily assembled, quickly prepared, and affordable, patients are more likely to prepare more healthy foods at home. These workshops focus on convenient breakfasts, simple entres, side dishes, and desserts which can be prepared with minimal effort and are consistent with nutrition recommendations for cardiovascular risk reduction. Cooking [employer] are taught by a Armed forces logistics/support/administrative officer (RD) who has been trained by the [employer]. The chef or RD has a clear understanding of the importance of minimizing - if not completely eliminating - added fat, sugar, and sodium in recipes. Throughout the series of Cooking School Workshop sessions, patients will learn about healthy ingredients and efficient methods of cooking to build confidence in their capability to prepare    Cooking School weekly topics:  Adding Flavor- Sodium-Free  Fast and Healthy Breakfasts  Powerhouse Plant-Based Proteins  Satisfying Salads and Dressings  Simple Sides and Sauces  International Cuisine-Spotlight on the [employer] Zones  Delicious Desserts  Savory Soups  [employer] - Meals in a Astronomer Appetizers and Snacks  Comforting Weekend Breakfasts  One-Pot Wonders   Fast Evening Meals  Landscape architect Your Pritikin Plate  WORKSHOPS   Healthy Mindset (Psychosocial):  Focused Goals, Sustainable Changes Clinical staff led group instruction and group discussion with PowerPoint presentation and patient guidebook. To enhance the learning environment the use of posters, models and videos may be added. Patients will be able to apply effective goal setting strategies to establish at least one personal goal, and then take consistent, meaningful action toward that goal. They will learn to identify common barriers to achieving personal goals and develop strategies to overcome them. Patients will  also gain an understanding of how our mind-set can impact our ability to achieve goals and the importance of cultivating a positive and growth-oriented mind-set. The purpose of this lesson is to provide patients with a deeper understanding of how to set and achieve personal goals, as well as the tools and strategies needed to overcome common obstacles which may arise along  the way.  From Head to Heart: The Power of a Healthy Outlook  Clinical staff led group instruction and group discussion with PowerPoint presentation and patient guidebook. To enhance the learning environment the use of posters, models and videos may be added. Patients will be able to recognize and describe the impact of emotions and mood on physical health. They will discover the importance of self-care and explore self-care practices which may work for them. Patients will also learn how to utilize the 4 C's to cultivate a healthier outlook and better manage stress and challenges. The purpose of this lesson is to demonstrate to patients how a healthy outlook is an essential part of maintaining good health, especially as they continue their cardiac rehab journey.  Healthy Sleep for a Healthy Heart Clinical staff led group instruction and group discussion with PowerPoint presentation and patient guidebook. To enhance the learning environment the use of posters, models and videos may be added. At the conclusion of this workshop, patients will be able to demonstrate knowledge of the importance of sleep to overall health, well-being, and quality of life. They will understand the symptoms of, and treatments for, common sleep disorders. Patients will also be able to identify daytime and nighttime behaviors which impact sleep, and they will be able to apply these tools to help manage sleep-related challenges. The purpose of this lesson is to provide patients with a general overview of sleep and outline the importance of quality sleep. Patients will  learn about a few of the most common sleep disorders. Patients will also be introduced to the concept of "sleep hygiene," and discover ways to self-manage certain sleeping problems through simple daily behavior changes. Finally, the workshop will motivate patients by clarifying the links between quality sleep and their goals of heart-healthy living.   Recognizing and Reducing Stress Clinical staff led group instruction and group discussion with PowerPoint presentation and patient guidebook. To enhance the learning environment the use of posters, models and videos may be added. At the conclusion of this workshop, patients will be able to understand the types of stress reactions, differentiate between acute and chronic stress, and recognize the impact that chronic stress has on their health. They will also be able to apply different coping mechanisms, such as reframing negative self-talk. Patients will have the opportunity to practice a variety of stress management techniques, such as deep abdominal breathing, progressive muscle relaxation, and/or guided imagery.  The purpose of this lesson is to educate patients on the role of stress in their lives and to provide healthy techniques for coping with it.  Learning Barriers/Preferences:  Learning Barriers/Preferences - 05/04/23 1510       Learning Barriers/Preferences   Learning Barriers Sight    Learning Preferences Individual Instruction;Group Instruction;Skilled Demonstration             Education Topics:  Knowledge Questionnaire Score:  Knowledge Questionnaire Score - 05/04/23 1509       Knowledge Questionnaire Score   Pre Score 17/24             Core Components/Risk Factors/Patient Goals at Admission:  Personal Goals and Risk Factors at Admission - 05/04/23 1509       Core Components/Risk Factors/Patient Goals on Admission   Diabetes Yes    Intervention Provide education about signs/symptoms and action to take for  hypo/hyperglycemia.;Provide education about proper nutrition, including hydration, and aerobic/resistive exercise prescription along with prescribed medications to achieve blood glucose in normal ranges: Fasting glucose 65-99 mg/dL  Expected Outcomes Short Term: Participant verbalizes understanding of the signs/symptoms and immediate care of hyper/hypoglycemia, proper foot care and importance of medication, aerobic/resistive exercise and nutrition plan for blood glucose control.;Long Term: Attainment of HbA1C < 7%.    Hypertension Yes    Intervention Provide education on lifestyle modifcations including regular physical activity/exercise, weight management, moderate sodium restriction and increased consumption of fresh fruit, vegetables, and low fat dairy, alcohol moderation, and smoking cessation.;Monitor prescription use compliance.    Expected Outcomes Short Term: Continued assessment and intervention until BP is < 140/67mm HG in hypertensive participants. < 130/57mm HG in hypertensive participants with diabetes, heart failure or chronic kidney disease.;Long Term: Maintenance of blood pressure at goal levels.    Lipids Yes    Intervention Provide education and support for participant on nutrition & aerobic/resistive exercise along with prescribed medications to achieve LDL 70mg , HDL >40mg .    Expected Outcomes Short Term: Participant states understanding of desired cholesterol values and is compliant with medications prescribed. Participant is following exercise prescription and nutrition guidelines.;Long Term: Cholesterol controlled with medications as prescribed, with individualized exercise RX and with personalized nutrition plan. Value goals: LDL < 70mg , HDL > 40 mg.             Core Components/Risk Factors/Patient Goals Review:   Goals and Risk Factor Review     Row Name 05/15/23 1426 06/14/23 1100           Core Components/Risk Factors/Patient Goals Review   Personal Goals Review  Weight Management/Obesity;Hypertension;Lipids;Diabetes Weight Management/Obesity;Hypertension;Lipids;Diabetes      Review [facility] started intensive cardiac rehab on 05/15/23 and did well with exercise. Vital signs were stable. Cecelia Byars is doing well well with exercise at cardiac rehab. Cecelia Byars has had some resting systolic BP's in the 90's has been asymptomatic. Dr Roby Lofts recently decreased toprol dose. Will continue to monitor BP's      Expected Outcomes Cecelia Byars will continue to participate in intensive cardiac rehab for exercise, nutrition and lifestyle modifications Cecelia Byars will continue to participate in intensive cardiac rehab for exercise, nutrition and lifestyle modifications               Core Components/Risk Factors/Patient Goals at Discharge (Final Review):   Goals and Risk Factor Review - 06/14/23 1100       Core Components/Risk Factors/Patient Goals Review   Personal Goals Review Weight Management/Obesity;Hypertension;Lipids;Diabetes    Review Cecelia Byars is doing well well with exercise at cardiac rehab. Cecelia Byars has had some resting systolic BP's in the 90's has been asymptomatic. Dr Roby Lofts recently decreased toprol dose. Will continue to monitor BP's    Expected Outcomes Cecelia Byars will continue to participate in intensive cardiac rehab for exercise, nutrition and lifestyle modifications             ITP Comments:  ITP Comments     Row Name 05/04/23 1345 05/15/23 1424 06/14/23 1054       ITP Comments Armanda Magic, MD: Medical Director.  Introduction to the [facility] / Intensive Cardiac Rehab.  Initial orientation packet reviewed with the patient. 30 Day ITP review. [facility] started intensive cardiac rehab on 05/15/23 and did well with exercise 30 Day ITP review. Cecelia Byars has good attendance and participation in  intensive cardiac rehab              Comments: See ITP comments.Thayer Headings RN BSN

## 2023-06-16 ENCOUNTER — Encounter (HOSPITAL_COMMUNITY)
Admission: RE | Admit: 2023-06-16 | Discharge: 2023-06-16 | Disposition: A | Discharge status: Home / Self Care | Payer: Medicare Other | Source: Ambulatory Visit | Attending: Internal Medicine | Admitting: Internal Medicine

## 2023-06-16 DIAGNOSIS — I214 Non-ST elevation (NSTEMI) myocardial infarction: Secondary | ICD-10-CM

## 2023-06-16 DIAGNOSIS — Z955 Presence of coronary angioplasty implant and graft: Secondary | ICD-10-CM

## 2023-06-16 DIAGNOSIS — I252 Old myocardial infarction: Secondary | ICD-10-CM | POA: Diagnosis not present

## 2023-06-16 DIAGNOSIS — Z48812 Encounter for surgical aftercare following surgery on the circulatory system: Secondary | ICD-10-CM | POA: Diagnosis not present

## 2023-06-19 ENCOUNTER — Encounter (HOSPITAL_COMMUNITY)
Admission: RE | Admit: 2023-06-19 | Discharge: 2023-06-19 | Disposition: A | Discharge status: Home / Self Care | Payer: Medicare Other | Source: Ambulatory Visit | Attending: Internal Medicine | Admitting: Internal Medicine

## 2023-06-19 DIAGNOSIS — I214 Non-ST elevation (NSTEMI) myocardial infarction: Secondary | ICD-10-CM

## 2023-06-19 DIAGNOSIS — Z955 Presence of coronary angioplasty implant and graft: Secondary | ICD-10-CM | POA: Diagnosis not present

## 2023-06-19 DIAGNOSIS — I252 Old myocardial infarction: Secondary | ICD-10-CM | POA: Diagnosis not present

## 2023-06-19 DIAGNOSIS — Z48812 Encounter for surgical aftercare following surgery on the circulatory system: Secondary | ICD-10-CM | POA: Diagnosis not present

## 2023-06-21 ENCOUNTER — Encounter (HOSPITAL_COMMUNITY)
Admission: RE | Admit: 2023-06-21 | Discharge: 2023-06-21 | Disposition: A | Discharge status: Home / Self Care | Payer: Medicare Other | Source: Ambulatory Visit | Attending: Internal Medicine | Admitting: Internal Medicine

## 2023-06-21 DIAGNOSIS — Z955 Presence of coronary angioplasty implant and graft: Secondary | ICD-10-CM

## 2023-06-21 DIAGNOSIS — I214 Non-ST elevation (NSTEMI) myocardial infarction: Secondary | ICD-10-CM

## 2023-06-21 DIAGNOSIS — Z48812 Encounter for surgical aftercare following surgery on the circulatory system: Secondary | ICD-10-CM | POA: Diagnosis not present

## 2023-06-21 DIAGNOSIS — I252 Old myocardial infarction: Secondary | ICD-10-CM | POA: Diagnosis not present

## 2023-06-23 ENCOUNTER — Encounter (HOSPITAL_COMMUNITY)
Admission: RE | Admit: 2023-06-23 | Discharge: 2023-06-23 | Disposition: A | Discharge status: Home / Self Care | Payer: Medicare Other | Source: Ambulatory Visit | Attending: Internal Medicine | Admitting: Internal Medicine

## 2023-06-23 DIAGNOSIS — Z955 Presence of coronary angioplasty implant and graft: Secondary | ICD-10-CM

## 2023-06-23 DIAGNOSIS — I214 Non-ST elevation (NSTEMI) myocardial infarction: Secondary | ICD-10-CM

## 2023-06-23 DIAGNOSIS — Z48812 Encounter for surgical aftercare following surgery on the circulatory system: Secondary | ICD-10-CM | POA: Diagnosis not present

## 2023-06-23 DIAGNOSIS — I252 Old myocardial infarction: Secondary | ICD-10-CM | POA: Diagnosis not present

## 2023-06-26 ENCOUNTER — Encounter (HOSPITAL_COMMUNITY): Admission: RE | Admit: 2023-06-26 | Payer: Medicare Other | Source: Ambulatory Visit

## 2023-06-26 DIAGNOSIS — Z955 Presence of coronary angioplasty implant and graft: Secondary | ICD-10-CM

## 2023-06-26 DIAGNOSIS — Z48812 Encounter for surgical aftercare following surgery on the circulatory system: Secondary | ICD-10-CM | POA: Diagnosis not present

## 2023-06-26 DIAGNOSIS — I214 Non-ST elevation (NSTEMI) myocardial infarction: Secondary | ICD-10-CM

## 2023-06-26 DIAGNOSIS — I252 Old myocardial infarction: Secondary | ICD-10-CM | POA: Diagnosis not present

## 2023-06-28 ENCOUNTER — Encounter (HOSPITAL_COMMUNITY)
Admission: RE | Admit: 2023-06-28 | Discharge: 2023-06-28 | Disposition: A | Discharge status: Home / Self Care | Payer: Medicare Other | Source: Ambulatory Visit | Attending: Internal Medicine | Admitting: Internal Medicine

## 2023-06-28 DIAGNOSIS — I252 Old myocardial infarction: Secondary | ICD-10-CM | POA: Diagnosis not present

## 2023-06-28 DIAGNOSIS — I214 Non-ST elevation (NSTEMI) myocardial infarction: Secondary | ICD-10-CM

## 2023-06-28 DIAGNOSIS — Z48812 Encounter for surgical aftercare following surgery on the circulatory system: Secondary | ICD-10-CM | POA: Diagnosis not present

## 2023-06-28 DIAGNOSIS — Z955 Presence of coronary angioplasty implant and graft: Secondary | ICD-10-CM

## 2023-06-28 NOTE — Progress Notes (Signed)
 Reviewed home exercise Rx with patient today.  Encouraged warm-up, cool-down, and stretching. Reviewed THRR of  and keeping RPE between 11-13. Encouraged to hydrate with activity.  Reviewed weather parameters for temperature and humidity for safe exercise outdoors. Reviewed S/S to terminate exercise and when to call 911 vs MD. Pt encouraged to always carry a cell phone for safety when exercising outdoors. Pt verbalized understanding of the home exercise Rx and was provided a copy.   Lesly Rubenstein MS, ACSM-CEP, CCRP

## 2023-06-30 ENCOUNTER — Encounter (HOSPITAL_COMMUNITY): Payer: Medicare Other

## 2023-06-30 ENCOUNTER — Telehealth (HOSPITAL_COMMUNITY): Payer: Self-pay

## 2023-07-03 ENCOUNTER — Encounter (HOSPITAL_COMMUNITY)
Admission: RE | Admit: 2023-07-03 | Discharge: 2023-07-03 | Disposition: A | Discharge status: Home / Self Care | Payer: Medicare Other | Source: Ambulatory Visit | Attending: Internal Medicine | Admitting: Internal Medicine

## 2023-07-03 DIAGNOSIS — I214 Non-ST elevation (NSTEMI) myocardial infarction: Secondary | ICD-10-CM

## 2023-07-03 DIAGNOSIS — Z955 Presence of coronary angioplasty implant and graft: Secondary | ICD-10-CM | POA: Diagnosis not present

## 2023-07-03 DIAGNOSIS — I252 Old myocardial infarction: Secondary | ICD-10-CM | POA: Diagnosis not present

## 2023-07-03 DIAGNOSIS — Z48812 Encounter for surgical aftercare following surgery on the circulatory system: Secondary | ICD-10-CM | POA: Diagnosis not present

## 2023-07-05 ENCOUNTER — Encounter (HOSPITAL_COMMUNITY)
Admission: RE | Admit: 2023-07-05 | Discharge: 2023-07-05 | Disposition: A | Discharge status: Home / Self Care | Payer: Medicare Other | Source: Ambulatory Visit | Attending: Internal Medicine | Admitting: Internal Medicine

## 2023-07-05 DIAGNOSIS — I214 Non-ST elevation (NSTEMI) myocardial infarction: Secondary | ICD-10-CM

## 2023-07-05 DIAGNOSIS — Z48812 Encounter for surgical aftercare following surgery on the circulatory system: Secondary | ICD-10-CM | POA: Diagnosis not present

## 2023-07-05 DIAGNOSIS — Z955 Presence of coronary angioplasty implant and graft: Secondary | ICD-10-CM | POA: Diagnosis not present

## 2023-07-05 DIAGNOSIS — I252 Old myocardial infarction: Secondary | ICD-10-CM | POA: Diagnosis not present

## 2023-07-07 ENCOUNTER — Encounter (HOSPITAL_COMMUNITY)
Admission: RE | Admit: 2023-07-07 | Discharge: 2023-07-07 | Disposition: A | Discharge status: Home / Self Care | Payer: Medicare Other | Source: Ambulatory Visit | Attending: Internal Medicine | Admitting: Internal Medicine

## 2023-07-07 DIAGNOSIS — I214 Non-ST elevation (NSTEMI) myocardial infarction: Secondary | ICD-10-CM

## 2023-07-07 DIAGNOSIS — Z955 Presence of coronary angioplasty implant and graft: Secondary | ICD-10-CM

## 2023-07-07 DIAGNOSIS — I252 Old myocardial infarction: Secondary | ICD-10-CM | POA: Diagnosis not present

## 2023-07-07 DIAGNOSIS — Z48812 Encounter for surgical aftercare following surgery on the circulatory system: Secondary | ICD-10-CM | POA: Diagnosis not present

## 2023-07-10 ENCOUNTER — Encounter (HOSPITAL_COMMUNITY)
Admission: RE | Admit: 2023-07-10 | Discharge: 2023-07-10 | Disposition: A | Discharge status: Home / Self Care | Payer: Medicare Other | Source: Ambulatory Visit | Attending: Internal Medicine | Admitting: Internal Medicine

## 2023-07-10 DIAGNOSIS — Z955 Presence of coronary angioplasty implant and graft: Secondary | ICD-10-CM

## 2023-07-10 DIAGNOSIS — I252 Old myocardial infarction: Secondary | ICD-10-CM | POA: Diagnosis not present

## 2023-07-10 DIAGNOSIS — I214 Non-ST elevation (NSTEMI) myocardial infarction: Secondary | ICD-10-CM

## 2023-07-10 DIAGNOSIS — Z48812 Encounter for surgical aftercare following surgery on the circulatory system: Secondary | ICD-10-CM | POA: Diagnosis not present

## 2023-07-11 NOTE — Progress Notes (Signed)
Cardiac Individual Treatment Plan  Patient Details  Name: Frederick Knight MRN: [id number] Date of Birth: September 15, 1951 Referring Provider:   Flowsheet Row INTENSIVE CARDIAC REHAB ORIENT from 05/04/2023 in [facility] for [facility], V[facility], & [facility]  Referring Provider Alverda Skeans, MD       Initial Encounter Date:  Flowsheet Row INTENSIVE CARDIAC REHAB ORIENT from 05/04/2023 in [facility] for Heart, Vascular, & Lung Health  Date 05/04/23       Visit Diagnosis: 03/28/23 NSTEMI (non-ST elevated myocardial infarction) (HCC)  03/29/23 S/P drug eluting coronary stent placement, LAD/ RCA  Patient's Home Medications on Admission:  Current Outpatient Medications:    acetaminophen (TYLENOL) 325 MG tablet, Take 2 tablets (650 mg total) by mouth every 4 (four) hours as needed for headache or mild pain., Disp: 30 tablet, Rfl: 1   apixaban (ELIQUIS) 5 MG TABS tablet, TAKE 1 TABLET BY MOUTH TWICE A DAY, Disp: 180 tablet, Rfl: 1   aspirin 81 MG chewable tablet, Chew 1 tablet (81 mg total) by mouth daily. (Patient not taking: Reported on 05/04/2023), Disp: 30 tablet, Rfl: 1   atorvastatin (LIPITOR) 80 MG tablet, Take 1 tablet (80 mg total) by mouth daily., Disp: 90 tablet, Rfl: 1   clopidogrel (PLAVIX) 75 MG tablet, TAKE 1 TABLET BY MOUTH EVERY DAY, Disp: 90 tablet, Rfl: 3   empagliflozin (JARDIANCE) 10 MG TABS tablet, TAKE 1 TABLET BY MOUTH EVERY DAY, Disp: 90 tablet, Rfl: 3   losartan (COZAAR) 25 MG tablet, Take 0.5 tablets (12.5 mg total) by mouth at bedtime., Disp: 45 tablet, Rfl: 3   metFORMIN (GLUCOPHAGE-XR) 500 MG 24 hr tablet, TAKE 1 TABLET BY MOUTH EVERY DAY WITH BREAKFAST (Patient taking differently: 500 mg daily with breakfast.), Disp: 90 tablet, Rfl: 1   metoprolol succinate (TOPROL XL) 25 MG 24 hr tablet, Take 1 tablet (25 mg total) by mouth daily., Disp: 90 tablet, Rfl: 3   tamsulosin (FLOMAX) 0.4 MG CAPS capsule, Take 1 capsule (0.4 mg  total) by mouth daily., Disp: 30 capsule, Rfl: 0  Past Medical History: Past Medical History:  Diagnosis Date   Diabetes mellitus without complication (HCC)    Hyperlipemia    Controlled well with medications    Tobacco Use: Social History   Tobacco Use  Smoking Status Never  Smokeless Tobacco Never    Labs: Review Flowsheet  More data exists      Latest Ref Rng & Units 06/07/2019 12/18/2020 01/04/2022 03/28/2023 03/29/2023  Labs for ITP Cardiac and Pulmonary Rehab  Cholestrol 0 - 200 mg/dL - - 829  - 562   LDL (calc) 0 - 99 mg/dL - - 84  - 53   HDL-C >13 mg/dL - - 44  - 45   Trlycerides <150 mg/dL - - 086  - 79   Hemoglobin A1c 4.8 - 5.6 % 6.6  5.6  6.0  6.1  -    Details            Capillary Blood Glucose: Lab Results  Component Value Date   GLUCAP 84 05/29/2023   GLUCAP 102 (H) 05/17/2023   GLUCAP 87 05/17/2023   GLUCAP 114 (H) 05/15/2023   GLUCAP 87 05/15/2023     Exercise Target Goals: Exercise Program Goal: Individual exercise prescription set using results from initial 6 min walk test and THRR while considering  patient's activity barriers and safety.   Exercise Prescription Goal: Initial exercise prescription builds to 30-45 minutes a day of  aerobic activity, 2-3 days per week.  Home exercise guidelines will be given to patient during program as part of exercise prescription that the participant will acknowledge.  Activity Barriers & Risk Stratification:  Activity Barriers & Cardiac Risk Stratification - 05/04/23 1511       Activity Barriers & Cardiac Risk Stratification   Activity Barriers Balance Concerns    Cardiac Risk Stratification High             6 Minute Walk:  6 Minute Walk     Row Name 05/04/23 1430         6 Minute Walk   Phase Initial     Distance 1177 feet     Walk Time 6 minutes     # of Rest Breaks 0     MPH 2.23     METS 2.73     RPE 8     Perceived Dyspnea  0     VO2 Peak 9.6     Symptoms No     Resting HR 95  bpm     Resting BP 122/68     Resting Oxygen Saturation  96 %     Exercise Oxygen Saturation  during 6 min walk 96 %     Max Ex. HR 105 bpm     Max Ex. BP 124/60     2 Minute Post BP 108/60              Oxygen Initial Assessment:   Oxygen Re-Evaluation:   Oxygen Discharge (Final Oxygen Re-Evaluation):   Initial Exercise Prescription:  Initial Exercise Prescription - 05/04/23 1500       Date of Initial Exercise RX and Referring Provider   Date 05/04/23    Referring Provider Alverda Skeans, MD    Expected Discharge Date 07/14/23      NuStep   Level 2    SPM 75    Minutes 15    METs 2.7      Arm Ergometer   Level 1    Watts 25    RPM 60    Minutes 15    METs 2.7      Prescription Details   Frequency (times per week) 3    Duration Progress to 30 minutes of continuous aerobic without signs/symptoms of physical distress      Intensity   THRR 40-80% of Max Heartrate 59-118    Ratings of Perceived Exertion 11-13    Perceived Dyspnea 0-4      Progression   Progression Continue progressive overload as per policy without signs/symptoms or physical distress.      Resistance Training   Training Prescription Yes    Weight 3 lbs    Reps 10-15             Perform Capillary Blood Glucose checks as needed.  Exercise Prescription Changes:   Exercise Prescription Changes     Row Name 05/15/23 1400 05/31/23 1400 06/07/23 1400 06/26/23 1400 06/28/23 1400     Response to Exercise   Blood Pressure (Admit) 114/68 110/58 100/56 124/60 122/68   Blood Pressure (Exercise) 150/70 138/74 146/66 130/62 144/72   Blood Pressure (Exit) 104/70 98/60 96/56  108/60 104/66   Heart Rate (Admit) 71 bpm 73 bpm 85 bpm 82 bpm 81 bpm   Heart Rate (Exercise) 122 bpm 123 bpm 129 bpm 115 bpm 128 bpm   Heart Rate (Exit) 80 bpm 81 bpm 94 bpm 76 bpm 90 bpm   Rating of Perceived Exertion (  Exercise) 12 12 12 10 10    Symptoms None None None -- None   Comments Pt's first day in the CRP2  program Reviewed METs Reviewed METs and goals Reviewed METs Reviewed Home exercise Rx   Duration Continue with 30 min of aerobic exercise without signs/symptoms of physical distress. Continue with 30 min of aerobic exercise without signs/symptoms of physical distress. Continue with 30 min of aerobic exercise without signs/symptoms of physical distress. Continue with 30 min of aerobic exercise without signs/symptoms of physical distress. Continue with 30 min of aerobic exercise without signs/symptoms of physical distress.   Intensity THRR unchanged THRR unchanged THRR unchanged THRR unchanged THRR unchanged     Progression   Progression Continue to progress workloads to maintain intensity without signs/symptoms of physical distress. Continue to progress workloads to maintain intensity without signs/symptoms of physical distress. Continue to progress workloads to maintain intensity without signs/symptoms of physical distress. Continue to progress workloads to maintain intensity without signs/symptoms of physical distress. Continue to progress workloads to maintain intensity without signs/symptoms of physical distress.   Average METs 2.75 3.1 3.9 3.3 3.65     Resistance Training   Training Prescription Yes No No Yes No   Weight 3 lbs No weights on wednesdays No weights on wednesdays 4 lbs No weights on wednesdays   Reps 10-15 -- -- 10-15 --   Time 10 Minutes -- -- 10 Minutes --     Interval Training   Interval Training No No No No No     Recumbant Bike   Level 1 2 3 3 3    RPM 76 85 82 83 79   Watts 25 26 44 -- 42   Minutes 15 15 5 15 15    METs 2.2 3 4  2.8 3.8     NuStep   Level 2 2 2 2 2    SPM 91 102 111 110 100   Minutes 15 15 15 15 15    METs 3.3 3.2 3.8 3.8 3.8     Home Exercise Plan   Plans to continue exercise at -- -- -- -- Home (comment)   Frequency -- -- -- -- Add 3 additional days to program exercise sessions.   Initial Home Exercises Provided -- -- -- -- 06/28/23    Row Name  07/06/23 0800             Response to Exercise   Blood Pressure (Admit) 102/60       Blood Pressure (Exercise) 142/74       Blood Pressure (Exit) 98/70       Heart Rate (Admit) 79 bpm       Heart Rate (Exercise) 119 bpm       Heart Rate (Exit) 87 bpm       Rating of Perceived Exertion (Exercise) 10       Symptoms None       Comments Reviewed METs and goals       Duration Continue with 30 min of aerobic exercise without signs/symptoms of physical distress.       Intensity THRR unchanged         Progression   Progression Continue to progress workloads to maintain intensity without signs/symptoms of physical distress.       Average METs 3.8         Resistance Training   Training Prescription No       Weight No weights on wednesdays         Interval Training   Interval Training  No         Recumbant Bike   Level 3       RPM 77       Watts 44       Minutes 15       METs 3.8         NuStep   Level 2       SPM 116       Minutes 15       METs 3.8         Home Exercise Plan   Plans to continue exercise at Home (comment)       Frequency Add 3 additional days to program exercise sessions.       Initial Home Exercises Provided 06/28/23                Exercise Comments:   Exercise Comments     Row Name 05/15/23 1443 05/31/23 1425 06/07/23 1558 06/26/23 1433 06/28/23 1420   Exercise Comments Pt's first day in the CRP2 program. Pt exercised without complaints. Reviewed METs. Pt is progressing on his MET levels. Pt will increase workload on nustep next session. Reviewed METs and goals. Pt voices imrpoved stregth and stamina which is a goal. Reviewed METs. Pt is making progress. No complaints with exercise. Reviewed home exericse Rx. Pt is walking and using stationary bike at home 2-4x/week. Encouraged to continue but increase time to at least 30 minutes. Pt verbalized understanding and was provied a copy of the home exercise Rx.    Row Name 07/05/23 1500            Exercise Comments Reviewed METs and goals. Pt continues to exercise at home. Pt will increase workload on nustep next session.                Exercise Goals and Review:   Exercise Goals     Row Name 05/04/23 1511             Exercise Goals   Increase Physical Activity Yes       Intervention Provide advice, education, support and counseling about physical activity/exercise needs.;Develop an individualized exercise prescription for aerobic and resistive training based on initial evaluation findings, risk stratification, comorbidities and participant's personal goals.       Expected Outcomes Short Term: Attend rehab on a regular basis to increase amount of physical activity.;Long Term: Add in home exercise to make exercise part of routine and to increase amount of physical activity.;Long Term: Exercising regularly at least 3-5 days a week.       Increase Strength and Stamina Yes       Intervention Provide advice, education, support and counseling about physical activity/exercise needs.;Develop an individualized exercise prescription for aerobic and resistive training based on initial evaluation findings, risk stratification, comorbidities and participant's personal goals.       Expected Outcomes Short Term: Increase workloads from initial exercise prescription for resistance, speed, and METs.;Short Term: Perform resistance training exercises routinely during rehab and add in resistance training at home;Long Term: Improve cardiorespiratory fitness, muscular endurance and strength as measured by increased METs and functional capacity ( )       Able to understand and use rate of perceived exertion (RPE) scale Yes       Intervention Provide education and explanation on how to use RPE scale       Expected Outcomes Short Term: Able to use RPE daily in rehab to express subjective intensity level;Long Term:  Able to  use RPE to guide intensity level when exercising independently       Knowledge  and understanding of Target Heart Rate Range (THRR) Yes       Intervention Provide education and explanation of THRR including how the numbers were predicted and where they are located for reference       Expected Outcomes Short Term: Able to state/look up THRR;Long Term: Able to use THRR to govern intensity when exercising independently;Short Term: Able to use daily as guideline for intensity in rehab       Understanding of Exercise Prescription Yes       Intervention Provide education, explanation, and written materials on patient's individual exercise prescription       Expected Outcomes Short Term: Able to explain program exercise prescription;Long Term: Able to explain home exercise prescription to exercise independently                Exercise Goals Re-Evaluation :  Exercise Goals Re-Evaluation     Row Name 05/15/23 1442 06/07/23 1444 07/05/23 1500         Exercise Goal Re-Evaluation   Exercise Goals Review Increase Physical Activity;Increase Strength and Stamina;Able to understand and use rate of perceived exertion (RPE) scale;Knowledge and understanding of Target Heart Rate Range (THRR);Understanding of Exercise Prescription Increase Physical Activity;Increase Strength and Stamina;Able to understand and use rate of perceived exertion (RPE) scale;Knowledge and understanding of Target Heart Rate Range (THRR);Understanding of Exercise Prescription Increase Physical Activity;Increase Strength and Stamina;Able to understand and use rate of perceived exertion (RPE) scale;Knowledge and understanding of Target Heart Rate Range (THRR);Understanding of Exercise Prescription     Comments Pt's first day in the CRP2 program. Pt understnads the exercise Rx, RPE scale and THRR. Reviewed METs and goals. Reviewed METs and goals. Pt voices he has made good progress on his goal of increased strength and stamina. He also has achieved another goal and is back into a regaular exercise program on off days  form the CRR2 program     Expected Outcomes Will continue to monitor the patient and progress exercise workloads as tolerated. Will continue to monitor the patient and progress exercise workloads as tolerated. Will continue to monitor the patient and progress exercise workloads as tolerated.              Discharge Exercise Prescription (Final Exercise Prescription Changes):  Exercise Prescription Changes - 07/06/23 0800       Response to Exercise   Blood Pressure (Admit) 102/60    Blood Pressure (Exercise) 142/74    Blood Pressure (Exit) 98/70    Heart Rate (Admit) 79 bpm    Heart Rate (Exercise) 119 bpm    Heart Rate (Exit) 87 bpm    Rating of Perceived Exertion (Exercise) 10    Symptoms None    Comments Reviewed METs and goals    Duration Continue with 30 min of aerobic exercise without signs/symptoms of physical distress.    Intensity THRR unchanged      Progression   Progression Continue to progress workloads to maintain intensity without signs/symptoms of physical distress.    Average METs 3.8      Resistance Training   Training Prescription No    Weight No weights on wednesdays      Interval Training   Interval Training No      Recumbant Bike   Level 3    RPM 77    Watts 44    Minutes 15    METs 3.8  NuStep   Level 2    SPM 116    Minutes 15    METs 3.8      Home Exercise Plan   Plans to continue exercise at Home (comment)    Frequency Add 3 additional days to program exercise sessions.    Initial Home Exercises Provided 06/28/23             Nutrition:  Target Goals: Understanding of nutrition guidelines, daily intake of sodium 1500mg , cholesterol 200mg , calories 30% from fat and 7% or less from saturated fats, daily to have 5 or more servings of fruits and vegetables.  Biometrics:  Pre Biometrics - 05/04/23 1355       Pre Biometrics   Waist Circumference 38 inches    Hip Circumference 39 inches    Waist to Hip Ratio 0.97 %     Triceps Skinfold 9 mm    Grip Strength 32 kg    Flexibility 14 in    Single Leg Stand 6.08 seconds             Post Biometrics - 05/04/23 1355        Post  Biometrics   % Body Fat 23.5 %             Nutrition Therapy Plan and Nutrition Goals:  Nutrition Therapy & Goals - 07/10/23 1414       Nutrition Therapy   Diet Heart healthy diet    Drug/Food Interactions Statins/Certain Fruits      Personal Nutrition Goals   Nutrition Goal Patient to identify strategies for reducing cardiovascular risk by attending the [employer] education and nutrition series weekly.    Personal Goal #2 Patient to improve diet quality by using the plate method as a guide for meal planning to include lean protein/plant protein, fruits, vegetables, whole grains, nonfat dairy as part of a well-balanced diet.    Comments Juell declines nutrition education at this time. Patient's A1c is well controlled. He is down 9# since starting with our program; however, weight loss has slowed over the last month. He is down 1.1# over the last month. He denies appetite changes or changes to eating habits/diet. BMI remains stable for age. Patient will benefit from participation in intensive cardiac rehab for nutrition, exercise, and lifestyle modification.      Intervention Plan   Intervention Prescribe, educate and counsel regarding individualized specific dietary modifications aiming towards targeted core components such as weight, hypertension, lipid management, diabetes, heart failure and other comorbidities.;Nutrition handout(s) given to patient.    Expected Outcomes Short Term Goal: Understand basic principles of dietary content, such as calories, fat, sodium, cholesterol and nutrients.;Long Term Goal: Adherence to prescribed nutrition plan.             Nutrition Assessments:  Nutrition Assessments - 05/15/23 1424       Rate Your Plate Scores   Pre Score 73            MEDIFICTS Score Key: ?70 Need to  make dietary changes  40-70 Heart Healthy Diet ? 40 Therapeutic Level Cholesterol Diet   Flowsheet Row INTENSIVE CARDIAC REHAB from 05/15/2023 in [facility] for Heart, Vascular, & Lung Health  Picture Your Plate Total Score on Admission 73      Picture Your Plate Scores: <40 Unhealthy dietary pattern with much room for improvement. 41-50 Dietary pattern unlikely to meet recommendations for good health and room for improvement. 51-60 More healthful dietary pattern, with some room for  improvement.  >60 Healthy dietary pattern, although there may be some specific behaviors that could be improved.    Nutrition Goals Re-Evaluation:  Nutrition Goals Re-Evaluation     Row Name 05/15/23 1338 06/13/23 1633 07/10/23 1414         Goals   Current Weight 158 lb 15.2 oz (72.1 kg) 151 lb 0.2 oz (68.5 kg) 149 lb 14.6 oz (68 kg)     Comment lipids WNL, Lipoprotein A WNL, A1c 6.1 no new labs; most recent labs lipids WNL, Lipoprotein A WNL, A1c 6.1 no new labs; most recent labs lipids WNL, Lipoprotein A WNL, A1c 6.1     Expected Outcome Patient's A1c is well controlled. Patient will benefit from participation in intensive cardiac rehab for nutrition, exercise, and lifestyle modification. Codylee declines nutrition education at this time. Patient's A1c is well controlled. He is down 7.9# since starting with our program. Patient will benefit from participation in intensive cardiac rehab for nutrition, exercise, and lifestyle modification. Ociel declines nutrition education at this time. Patient's A1c is well controlled. He is down 9# since starting with our program; however, weight loss has slowed over the last month. He is down 1.1# over the last month. He denies appetite changes or changes to eating habits/diet. BMI remains stable for age. Patient will benefit from participation in intensive cardiac rehab for nutrition, exercise, and lifestyle modification.               Nutrition Goals Re-Evaluation:  Nutrition Goals Re-Evaluation     Row Name 05/15/23 1338 06/13/23 1633 07/10/23 1414         Goals   Current Weight 158 lb 15.2 oz (72.1 kg) 151 lb 0.2 oz (68.5 kg) 149 lb 14.6 oz (68 kg)     Comment lipids WNL, Lipoprotein A WNL, A1c 6.1 no new labs; most recent labs lipids WNL, Lipoprotein A WNL, A1c 6.1 no new labs; most recent labs lipids WNL, Lipoprotein A WNL, A1c 6.1     Expected Outcome Patient's A1c is well controlled. Patient will benefit from participation in intensive cardiac rehab for nutrition, exercise, and lifestyle modification. Aubrey declines nutrition education at this time. Patient's A1c is well controlled. He is down 7.9# since starting with our program. Patient will benefit from participation in intensive cardiac rehab for nutrition, exercise, and lifestyle modification. Alicia declines nutrition education at this time. Patient's A1c is well controlled. He is down 9# since starting with our program; however, weight loss has slowed over the last month. He is down 1.1# over the last month. He denies appetite changes or changes to eating habits/diet. BMI remains stable for age. Patient will benefit from participation in intensive cardiac rehab for nutrition, exercise, and lifestyle modification.              Nutrition Goals Discharge (Final Nutrition Goals Re-Evaluation):  Nutrition Goals Re-Evaluation - 07/10/23 1414       Goals   Current Weight 149 lb 14.6 oz (68 kg)    Comment no new labs; most recent labs lipids WNL, Lipoprotein A WNL, A1c 6.1    Expected Outcome Tyreck declines nutrition education at this time. Patient's A1c is well controlled. He is down 9# since starting with our program; however, weight loss has slowed over the last month. He is down 1.1# over the last month. He denies appetite changes or changes to eating habits/diet. BMI remains stable for age. Patient will benefit from participation in intensive cardiac  rehab for nutrition, exercise, and  lifestyle modification.             Psychosocial: Target Goals: Acknowledge presence or absence of significant depression and/or stress, maximize coping skills, provide positive support system. Participant is able to verbalize types and ability to use techniques and skills needed for reducing stress and depression.  Initial Review & Psychosocial Screening:  Initial Psych Review & Screening - 05/04/23 1506       Initial Review   Current issues with None Identified      Family Dynamics   Good Support System? Yes   Has mother, sister and daughter for support     Barriers   Psychosocial barriers to participate in program There are no identifiable barriers or psychosocial needs.      Screening Interventions   Interventions Encouraged to exercise             Quality of Life Scores:  Quality of Life - 05/04/23 1532       Quality of Life   Select Quality of Life      Quality of Life Scores   Health/Function Pre 28.77 %    Socioeconomic Pre 29.06 %    Psych/Spiritual Pre 30 %    Family Pre 27.6 %    GLOBAL Pre 28.91 %            Scores of 19 and below usually indicate a poorer quality of life in these areas.  A difference of  2-3 points is a clinically meaningful difference.  A difference of 2-3 points in the total score of the Quality of Life Index has been associated with significant improvement in overall quality of life, self-image, physical symptoms, and general health in studies assessing change in quality of life.  PHQ-9: Review Flowsheet  More data exists      05/04/2023 04/06/2023 12/15/2022 04/14/2022 02/24/2022  Depression screen PHQ 2/9  Decreased Interest 0 0 0 0 0  Down, Depressed, Hopeless 0 0 0 0 0  PHQ - 2 Score 0 0 0 0 0  Altered sleeping 0 2 0 0 0  Tired, decreased energy 1 2 0 0 0  Change in appetite 0 0 0 0 0  Feeling bad or failure about yourself  0 0 0 0 0  Trouble concentrating 0 0 0 0 0  Moving slowly or  fidgety/restless 0 0 0 0 0  Suicidal thoughts 0 0 0 0 0  PHQ-9 Score 1 4 0 0 0  Difficult doing work/chores Not difficult at all Somewhat difficult Not difficult at all Not difficult at all -    Details           Interpretation of Total Score  Total Score Depression Severity:  1-4 = Minimal depression, 5-9 = Mild depression, 10-14 = Moderate depression, 15-19 = Moderately severe depression, 20-27 = Severe depression   Psychosocial Evaluation and Intervention:   Psychosocial Re-Evaluation:  Psychosocial Re-Evaluation     Row Name 05/15/23 1425 06/14/23 1055           Psychosocial Re-Evaluation   Current issues with None Identified None Identified      Interventions Encouraged to attend Cardiac Rehabilitation for the exercise Encouraged to attend Cardiac Rehabilitation for the exercise      Continue Psychosocial Services  No Follow up required No Follow up required               Psychosocial Discharge (Final Psychosocial Re-Evaluation):  Psychosocial Re-Evaluation - 06/14/23 1055  Psychosocial Re-Evaluation   Current issues with None Identified    Interventions Encouraged to attend Cardiac Rehabilitation for the exercise    Continue Psychosocial Services  No Follow up required             Vocational Rehabilitation: Provide vocational rehab assistance to qualifying candidates.   Vocational Rehab Evaluation & Intervention:  Vocational Rehab - 05/04/23 1506       Initial Vocational Rehab Evaluation & Intervention   Assessment shows need for Vocational Rehabilitation No   No needs, pt back to work as an Runner, broadcasting/film/video            Education: Education Goals: Education classes will be provided on a weekly basis, covering required topics. Participant will state understanding/return demonstration of topics presented.     Core Videos: Exercise    Move It!  Clinical staff conducted group or individual video education with verbal and written material and  guidebook.  Patient learns the recommended Pritikin exercise program. Exercise with the goal of living a long, healthy life. Some of the health benefits of exercise include controlled diabetes, healthier blood pressure levels, improved cholesterol levels, improved heart and lung capacity, improved sleep, and better body composition. Everyone should speak with their doctor before starting or changing an exercise routine.  Biomechanical Limitations Clinical staff conducted group or individual video education with verbal and written material and guidebook.  Patient learns how biomechanical limitations can impact exercise and how we can mitigate and possibly overcome limitations to have an impactful and balanced exercise routine.  Body Composition Clinical staff conducted group or individual video education with verbal and written material and guidebook.  Patient learns that body composition (ratio of muscle mass to fat mass) is a key component to assessing overall fitness, rather than body weight alone. Increased fat mass, especially visceral belly fat, can put [place] at increased risk for metabolic syndrome, type 2 diabetes, heart disease, and even death. It is recommended to combine diet and exercise (cardiovascular and resistance training) to improve your body composition. Seek guidance from your physician and exercise physiologist before implementing an exercise routine.  Exercise Action Plan Clinical staff conducted group or individual video education with verbal and written material and guidebook.  Patient learns the recommended strategies to achieve and enjoy long-term exercise adherence, including variety, self-motivation, self-efficacy, and positive decision making. Benefits of exercise include fitness, good health, weight management, more energy, better sleep, less stress, and overall well-being.  Medical   Heart Disease Risk Reduction Clinical staff conducted group or individual video education  with verbal and written material and guidebook.  Patient learns our heart is our most vital organ as it circulates oxygen, nutrients, white blood cells, and hormones throughout the entire body, and carries waste away. Data supports a plant-based eating plan like the Pritikin Program for its effectiveness in slowing progression of and reversing heart disease. The video provides a number of recommendations to address heart disease.   Metabolic Syndrome and Belly Fat  Clinical staff conducted group or individual video education with verbal and written material and guidebook.  Patient learns what metabolic syndrome is, how it leads to heart disease, and how one can reverse it and keep it from coming back. You have metabolic syndrome if you have 3 of the following 5 criteria: abdominal obesity, high blood pressure, high triglycerides, low HDL cholesterol, and high blood sugar.  Hypertension and Heart Disease Clinical staff conducted group or individual video education with verbal and written material and guidebook.  Patient learns that high blood pressure, or hypertension, is very common in the [place]. Hypertension is largely due to excessive salt intake, but other important risk factors include being overweight, physical inactivity, drinking too much alcohol, smoking, and not eating enough potassium from fruits and vegetables. High blood pressure is a leading risk factor for heart attack, stroke, congestive heart failure, dementia, kidney failure, and premature death. Long-term effects of excessive salt intake include stiffening of the arteries and thickening of heart muscle and organ damage. Recommendations include ways to reduce hypertension and the risk of heart disease.  Diseases of Our Time - Focusing on Diabetes Clinical staff conducted group or individual video education with verbal and written material and guidebook.  Patient learns why the best way to stop diseases of our time is prevention,  through food and other lifestyle changes. Medicine (such as prescription pills and surgeries) is often only a Band-Aid on the problem, not a long-term solution. Most common diseases of our time include obesity, type 2 diabetes, hypertension, heart disease, and cancer. The Pritikin Program is recommended and has been proven to help reduce, reverse, and/or prevent the damaging effects of metabolic syndrome.  Nutrition   Overview of the Pritikin Eating Plan  Clinical staff conducted group or individual video education with verbal and written material and guidebook.  Patient learns about the Pritikin Eating Plan for disease risk reduction. The Pritikin Eating Plan emphasizes a wide variety of unrefined, minimally-processed carbohydrates, like fruits, vegetables, whole grains, and legumes. Go, Caution, and Stop food choices are explained. Plant-based and lean animal proteins are emphasized. Rationale provided for low sodium intake for blood pressure control, low added sugars for blood sugar stabilization, and low added fats and oils for coronary artery disease risk reduction and weight management.  Calorie Density  Clinical staff conducted group or individual video education with verbal and written material and guidebook.  Patient learns about calorie density and how it impacts the Pritikin Eating Plan. Knowing the characteristics of the food you choose will help you decide whether those foods will lead to weight gain or weight loss, and whether you want to consume more or less of them. Weight loss is usually a side effect of the Pritikin Eating Plan because of its focus on low calorie-dense foods.  Label Reading  Clinical staff conducted group or individual video education with verbal and written material and guidebook.  Patient learns about the Pritikin recommended label reading guidelines and corresponding recommendations regarding calorie density, added sugars, sodium content, and whole grains.  Dining  Out - Part 1  Clinical staff conducted group or individual video education with verbal and written material and guidebook.  Patient learns that restaurant meals can be sabotaging because they can be so high in calories, fat, sodium, and/or sugar. Patient learns recommended strategies on how to positively address this and avoid unhealthy pitfalls.  Facts on Fats  Clinical staff conducted group or individual video education with verbal and written material and guidebook.  Patient learns that lifestyle modifications can be just as effective, if not more so, as many medications for lowering your risk of heart disease. A Pritikin lifestyle can help to reduce your risk of inflammation and atherosclerosis (cholesterol build-up, or plaque, in the artery walls). Lifestyle interventions such as dietary choices and physical activity address the cause of atherosclerosis. A review of the types of fats and their impact on blood cholesterol levels, along with dietary recommendations to reduce fat intake is also included.  Nutrition Action  Plan  Clinical staff conducted group or individual video education with verbal and written material and guidebook.  Patient learns how to incorporate Pritikin recommendations into their lifestyle. Recommendations include planning and keeping personal health goals in mind as an important part of their success.  Healthy Mind-Set    Healthy Minds, Bodies, Hearts  Clinical staff conducted group or individual video education with verbal and written material and guidebook.  Patient learns how to identify when they are stressed. Video will discuss the impact of that stress, as well as the many benefits of stress management. Patient will also be introduced to stress management techniques. The way we think, act, and feel has an impact on our hearts.  How Our Thoughts Can Heal Our Hearts  Clinical staff conducted group or individual video education with verbal and written material and  guidebook.  Patient learns that negative thoughts can cause depression and anxiety. This can result in negative lifestyle behavior and serious health problems. Cognitive behavioral therapy is an effective method to help control our thoughts in order to change and improve our emotional outlook.  Additional Videos:  Exercise    Improving Performance  Clinical staff conducted group or individual video education with verbal and written material and guidebook.  Patient learns to use a non-linear approach by alternating intensity levels and lengths of time spent exercising to help burn more calories and lose more body fat. Cardiovascular exercise helps improve heart health, metabolism, hormonal balance, blood sugar control, and recovery from fatigue. Resistance training improves strength, endurance, balance, coordination, reaction time, metabolism, and muscle mass. Flexibility exercise improves circulation, posture, and balance. Seek guidance from your physician and exercise physiologist before implementing an exercise routine and learn your capabilities and proper form for all exercise.  Introduction to Yoga  Clinical staff conducted group or individual video education with verbal and written material and guidebook.  Patient learns about yoga, a discipline of the coming together of mind, breath, and body. The benefits of yoga include improved flexibility, improved range of motion, better posture and core strength, increased lung function, weight loss, and positive self-image. Yoga's heart health benefits include lowered blood pressure, healthier heart rate, decreased cholesterol and triglyceride levels, improved immune function, and reduced stress. Seek guidance from your physician and exercise physiologist before implementing an exercise routine and learn your capabilities and proper form for all exercise.  Medical   Aging: Enhancing Your Quality of Life  Clinical staff conducted group or individual  video education with verbal and written material and guidebook.  Patient learns key strategies and recommendations to stay in good physical health and enhance quality of life, such as prevention strategies, having an advocate, securing a Health Care Proxy and Power of Attorney, and keeping a list of medications and system for tracking them. It also discusses how to avoid risk for bone loss.  Biology of Weight Control  Clinical staff conducted group or individual video education with verbal and written material and guidebook.  Patient learns that weight gain occurs because we consume more calories than we burn (eating more, moving less). Even if your body weight is normal, you may have higher ratios of fat compared to muscle mass. Too much body fat puts you at increased risk for cardiovascular disease, heart attack, stroke, type 2 diabetes, and obesity-related cancers. In addition to exercise, following the Pritikin Eating Plan can help reduce your risk.  Decoding Lab Results  Clinical staff conducted group or individual video education with verbal and written material and  guidebook.  Patient learns that lab test reflects one measurement whose values change over time and are influenced by many factors, including medication, stress, sleep, exercise, food, hydration, pre-existing medical conditions, and more. It is recommended to use the knowledge from this video to become more involved with your lab results and evaluate your numbers to speak with your doctor.   Diseases of Our Time - Overview  Clinical staff conducted group or individual video education with verbal and written material and guidebook.  Patient learns that according to the CDC, 50% to 70% of chronic diseases (such as obesity, type 2 diabetes, elevated lipids, hypertension, and heart disease) are avoidable through lifestyle improvements including healthier food choices, listening to satiety cues, and increased physical activity.  Sleep  Disorders Clinical staff conducted group or individual video education with verbal and written material and guidebook.  Patient learns how good quality and duration of sleep are important to overall health and well-being. Patient also learns about sleep disorders and how they impact health along with recommendations to address them, including discussing with a physician.  Nutrition  Dining Out - Part 2 Clinical staff conducted group or individual video education with verbal and written material and guidebook.  Patient learns how to plan ahead and communicate in order to maximize their dining experience in a healthy and nutritious manner. Included are recommended food choices based on the type of restaurant the patient is visiting.   Fueling a Banker conducted group or individual video education with verbal and written material and guidebook.  There is a strong connection between our food choices and our health. Diseases like obesity and type 2 diabetes are very prevalent and are in large-part due to lifestyle choices. The Pritikin Eating Plan provides plenty of food and hunger-curbing satisfaction. It is easy to follow, affordable, and helps reduce health risks.  Menu Workshop  Clinical staff conducted group or individual video education with verbal and written material and guidebook.  Patient learns that restaurant meals can sabotage health goals because they are often packed with calories, fat, sodium, and sugar. Recommendations include strategies to plan ahead and to communicate with the manager, chef, or server to help order a healthier meal.  Planning Your Eating Strategy  Clinical staff conducted group or individual video education with verbal and written material and guidebook.  Patient learns about the Pritikin Eating Plan and its benefit of reducing the risk of disease. The Pritikin Eating Plan does not focus on calories. Instead, it emphasizes high-quality,  nutrient-rich foods. By knowing the characteristics of the foods, we choose, we can determine their calorie density and make informed decisions.  Targeting Your Nutrition Priorities  Clinical staff conducted group or individual video education with verbal and written material and guidebook.  Patient learns that lifestyle habits have a tremendous impact on disease risk and progression. This video provides eating and physical activity recommendations based on your personal health goals, such as reducing LDL cholesterol, losing weight, preventing or controlling type 2 diabetes, and reducing high blood pressure.  Vitamins and Minerals  Clinical staff conducted group or individual video education with verbal and written material and guidebook.  Patient learns different ways to obtain key vitamins and minerals, including through a recommended healthy diet. It is important to discuss all supplements you take with your doctor.   Healthy Mind-Set    Smoking Cessation  Clinical staff conducted group or individual video education with verbal and written material and guidebook.  Patient learns that  cigarette smoking and tobacco addiction pose a serious health risk which affects millions of people. Stopping smoking will significantly reduce the risk of heart disease, lung disease, and many forms of cancer. Recommended strategies for quitting are covered, including working with your doctor to develop a successful plan.  Culinary   Becoming a Set designer conducted group or individual video education with verbal and written material and guidebook.  Patient learns that cooking at home can be healthy, cost-effective, quick, and puts them in control. Keys to cooking healthy recipes will include looking at your recipe, assessing your equipment needs, planning ahead, making it simple, choosing cost-effective seasonal ingredients, and limiting the use of added fats, salts, and sugars.  Cooking -  Breakfast and Snacks  Clinical staff conducted group or individual video education with verbal and written material and guidebook.  Patient learns how important breakfast is to satiety and nutrition through the entire day. Recommendations include key foods to eat during breakfast to help stabilize blood sugar levels and to prevent overeating at meals later in the day. Planning ahead is also a key component.  Cooking - Educational psychologist conducted group or individual video education with verbal and written material and guidebook.  Patient learns eating strategies to improve overall health, including an approach to cook more at home. Recommendations include thinking of animal protein as a side on your plate rather than center stage and focusing instead on lower calorie dense options like vegetables, fruits, whole grains, and plant-based proteins, such as beans. Making sauces in large quantities to freeze for later and leaving the skin on your vegetables are also recommended to maximize your experience.  Cooking - Healthy Salads and Dressing Clinical staff conducted group or individual video education with verbal and written material and guidebook.  Patient learns that vegetables, fruits, whole grains, and legumes are the foundations of the Pritikin Eating Plan. Recommendations include how to incorporate each of these in flavorful and healthy salads, and how to create homemade salad dressings. Proper handling of ingredients is also covered. Cooking - Soups and [employer] - Soups and Desserts Clinical staff conducted group or individual video education with verbal and written material and guidebook.  Patient learns that Pritikin soups and desserts make for easy, nutritious, and delicious snacks and meal components that are low in sodium, fat, sugar, and calorie density, while high in vitamins, minerals, and filling fiber. Recommendations include simple and healthy ideas for soups and  desserts.   Overview     The Pritikin Solution Program Overview Clinical staff conducted group or individual video education with verbal and written material and guidebook.  Patient learns that the results of the Pritikin Program have been documented in more than 100 articles published in peer-reviewed journals, and the benefits include reducing risk factors for (and, in some cases, even reversing) high cholesterol, high blood pressure, type 2 diabetes, obesity, and more! An overview of the three key pillars of the Pritikin Program will be covered: eating well, doing regular exercise, and having a healthy mind-set.  WORKSHOPS  Exercise: Exercise Basics: Building Your Action Plan Clinical staff led group instruction and group discussion with PowerPoint presentation and patient guidebook. To enhance the learning environment the use of posters, models and videos may be added. At the conclusion of this workshop, patients will comprehend the difference between physical activity and exercise, as well as the benefits of incorporating both, into their routine. Patients will understand the FITT (Frequency, Intensity,  Time, and Type) principle and how to use it to build an exercise action plan. In addition, safety concerns and other considerations for exercise and cardiac rehab will be addressed by the presenter. The purpose of this lesson is to promote a comprehensive and effective weekly exercise routine in order to improve patients' overall level of fitness.   Managing Heart Disease: Your Path to a Healthier Heart Clinical staff led group instruction and group discussion with PowerPoint presentation and patient guidebook. To enhance the learning environment the use of posters, models and videos may be added.At the conclusion of this workshop, patients will understand the anatomy and physiology of the heart. Additionally, they will understand how Pritikin's three pillars impact the risk factors, the  progression, and the management of heart disease.  The purpose of this lesson is to provide a high-level overview of the heart, heart disease, and how the Pritikin lifestyle positively impacts risk factors.  Exercise Biomechanics Clinical staff led group instruction and group discussion with PowerPoint presentation and patient guidebook. To enhance the learning environment the use of posters, models and videos may be added. Patients will learn how the structural parts of their bodies function and how these functions impact their daily activities, movement, and exercise. Patients will learn how to promote a neutral spine, learn how to manage pain, and identify ways to improve their physical movement in order to promote healthy living. The purpose of this lesson is to expose patients to common physical limitations that impact physical activity. Participants will learn practical ways to adapt and manage aches and pains, and to minimize their effect on regular exercise. Patients will learn how to maintain good posture while sitting, walking, and lifting.  Balance Training and Fall Prevention  Clinical staff led group instruction and group discussion with PowerPoint presentation and patient guidebook. To enhance the learning environment the use of posters, models and videos may be added. At the conclusion of this workshop, patients will understand the importance of their sensorimotor skills (vision, proprioception, and the vestibular system) in maintaining their ability to balance as they age. Patients will apply a variety of balancing exercises that are appropriate for their current level of function. Patients will understand the common causes for poor balance, possible solutions to these problems, and ways to modify their physical environment in order to minimize their fall risk. The purpose of this lesson is to teach patients about the importance of maintaining balance as they age and ways to  minimize their risk of falling.  WORKSHOPS   Nutrition:  Fueling a Ship broker led group instruction and group discussion with PowerPoint presentation and patient guidebook. To enhance the learning environment the use of posters, models and videos may be added. Patients will review the foundational principles of the Pritikin Eating Plan and understand what constitutes a serving size in each of the food groups. Patients will also learn Pritikin-friendly foods that are better choices when away from home and review make-ahead meal and snack options. Calorie density will be reviewed and applied to three nutrition priorities: weight maintenance, weight loss, and weight gain. The purpose of this lesson is to reinforce (in a group setting) the key concepts around what patients are recommended to eat and how to apply these guidelines when away from home by planning and selecting Pritikin-friendly options. Patients will understand how calorie density may be adjusted for different weight management goals.  Mindful Eating  Clinical staff led group instruction and group discussion with PowerPoint presentation and patient  guidebook. To enhance the learning environment the use of posters, models and videos may be added. Patients will briefly review the concepts of the Pritikin Eating Plan and the importance of low-calorie dense foods. The concept of mindful eating will be introduced as well as the importance of paying attention to internal hunger signals. Triggers for non-hunger eating and techniques for dealing with triggers will be explored. The purpose of this lesson is to provide patients with the opportunity to review the basic principles of the Pritikin Eating Plan, discuss the value of eating mindfully and how to measure internal cues of hunger and fullness using the Hunger Scale. Patients will also discuss reasons for non-hunger eating and learn strategies to use for controlling emotional  eating.  Targeting Your Nutrition Priorities Clinical staff led group instruction and group discussion with PowerPoint presentation and patient guidebook. To enhance the learning environment the use of posters, models and videos may be added. Patients will learn how to determine their genetic susceptibility to disease by reviewing their family history. Patients will gain insight into the importance of diet as part of an overall healthy lifestyle in mitigating the impact of genetics and other environmental insults. The purpose of this lesson is to provide patients with the opportunity to assess their personal nutrition priorities by looking at their family history, their own health history and current risk factors. Patients will also be able to discuss ways of prioritizing and modifying the Pritikin Eating Plan for their highest risk areas  Menu  Clinical staff led group instruction and group discussion with PowerPoint presentation and patient guidebook. To enhance the learning environment the use of posters, models and videos may be added. Using menus brought in from [employer], or printed from [employer], patients will apply the Pritikin dining out guidelines that were presented in the [employer] video. Patients will also be able to practice these guidelines in a variety of provided scenarios. The purpose of this lesson is to provide patients with the opportunity to practice hands-on learning of the Pritikin Dining Out guidelines with actual menus and practice scenarios.  Label Reading Clinical staff led group instruction and group discussion with PowerPoint presentation and patient guidebook. To enhance the learning environment the use of posters, models and videos may be added. Patients will review and discuss the Pritikin label reading guidelines presented in [employer]'s Label Reading Educational series video. Using fool labels brought in from local grocery stores and  markets, patients will apply the label reading guidelines and determine if the packaged food meet the Pritikin guidelines. The purpose of this lesson is to provide patients with the opportunity to review, discuss, and practice hands-on learning of the Pritikin Label Reading guidelines with actual packaged food labels. Cooking School  Pritikin's [employer] are designed to teach patients ways to prepare quick, simple, and affordable recipes at home. The importance of nutrition's role in chronic disease risk reduction is reflected in its emphasis in the overall Pritikin program. By learning how to prepare essential core Pritikin Eating Plan recipes, patients will increase control over what they eat; be able to customize the flavor of foods without the use of added salt, sugar, or fat; and improve the quality of the food they consume. By learning a set of core recipes which are easily assembled, quickly prepared, and affordable, patients are more likely to prepare more healthy foods at home. These workshops focus on convenient breakfasts, simple entres, side dishes, and desserts which can be prepared with  minimal effort and are consistent with nutrition recommendations for cardiovascular risk reduction. Cooking [employer] are taught by a Armed forces logistics/support/administrative officer (RD) who has been trained by the [employer]. The chef or RD has a clear understanding of the importance of minimizing - if not completely eliminating - added fat, sugar, and sodium in recipes. Throughout the series of Cooking School Workshop sessions, patients will learn about healthy ingredients and efficient methods of cooking to build confidence in their capability to prepare    Cooking School weekly topics:  Adding Flavor- Sodium-Free  Fast and Healthy Breakfasts  Powerhouse Plant-Based Proteins  Satisfying Salads and Dressings  Simple Sides and Sauces  International Cuisine-Spotlight on the [employer]  Zones  Delicious Desserts  Savory Soups  [employer] - Meals in a Astronomer Appetizers and Snacks  Comforting Weekend Breakfasts  One-Pot Wonders   Fast Evening Meals  Landscape architect Your Pritikin Plate  WORKSHOPS   Healthy Mindset (Psychosocial):  Focused Goals, Sustainable Changes Clinical staff led group instruction and group discussion with PowerPoint presentation and patient guidebook. To enhance the learning environment the use of posters, models and videos may be added. Patients will be able to apply effective goal setting strategies to establish at least one personal goal, and then take consistent, meaningful action toward that goal. They will learn to identify common barriers to achieving personal goals and develop strategies to overcome them. Patients will also gain an understanding of how our mind-set can impact our ability to achieve goals and the importance of cultivating a positive and growth-oriented mind-set. The purpose of this lesson is to provide patients with a deeper understanding of how to set and achieve personal goals, as well as the tools and strategies needed to overcome common obstacles which may arise along the way.  From Head to Heart: The Power of a Healthy Outlook  Clinical staff led group instruction and group discussion with PowerPoint presentation and patient guidebook. To enhance the learning environment the use of posters, models and videos may be added. Patients will be able to recognize and describe the impact of emotions and mood on physical health. They will discover the importance of self-care and explore self-care practices which may work for them. Patients will also learn how to utilize the 4 C's to cultivate a healthier outlook and better manage stress and challenges. The purpose of this lesson is to demonstrate to patients how a healthy outlook is an essential part of maintaining good health, especially as they continue their  cardiac rehab journey.  Healthy Sleep for a Healthy Heart Clinical staff led group instruction and group discussion with PowerPoint presentation and patient guidebook. To enhance the learning environment the use of posters, models and videos may be added. At the conclusion of this workshop, patients will be able to demonstrate knowledge of the importance of sleep to overall health, well-being, and quality of life. They will understand the symptoms of, and treatments for, common sleep disorders. Patients will also be able to identify daytime and nighttime behaviors which impact sleep, and they will be able to apply these tools to help manage sleep-related challenges. The purpose of this lesson is to provide patients with a general overview of sleep and outline the importance of quality sleep. Patients will learn about a few of the most common sleep disorders. Patients will also be introduced to the concept of "sleep hygiene," and discover ways to self-manage certain sleeping problems through simple daily behavior changes.  Finally, the workshop will motivate patients by clarifying the links between quality sleep and their goals of heart-healthy living.   Recognizing and Reducing Stress Clinical staff led group instruction and group discussion with PowerPoint presentation and patient guidebook. To enhance the learning environment the use of posters, models and videos may be added. At the conclusion of this workshop, patients will be able to understand the types of stress reactions, differentiate between acute and chronic stress, and recognize the impact that chronic stress has on their health. They will also be able to apply different coping mechanisms, such as reframing negative self-talk. Patients will have the opportunity to practice a variety of stress management techniques, such as deep abdominal breathing, progressive muscle relaxation, and/or guided imagery.  The purpose of this lesson is to educate  patients on the role of stress in their lives and to provide healthy techniques for coping with it.  Learning Barriers/Preferences:  Learning Barriers/Preferences - 05/04/23 1510       Learning Barriers/Preferences   Learning Barriers Sight    Learning Preferences Individual Instruction;Group Instruction;Skilled Demonstration             Education Topics:  Knowledge Questionnaire Score:  Knowledge Questionnaire Score - 05/04/23 1509       Knowledge Questionnaire Score   Pre Score 17/24             Core Components/Risk Factors/Patient Goals at Admission:  Personal Goals and Risk Factors at Admission - 05/04/23 1509       Core Components/Risk Factors/Patient Goals on Admission   Diabetes Yes    Intervention Provide education about signs/symptoms and action to take for hypo/hyperglycemia.;Provide education about proper nutrition, including hydration, and aerobic/resistive exercise prescription along with prescribed medications to achieve blood glucose in normal ranges: Fasting glucose 65-99 mg/dL    Expected Outcomes Short Term: Participant verbalizes understanding of the signs/symptoms and immediate care of hyper/hypoglycemia, proper foot care and importance of medication, aerobic/resistive exercise and nutrition plan for blood glucose control.;Long Term: Attainment of HbA1C < 7%.    Hypertension Yes    Intervention Provide education on lifestyle modifcations including regular physical activity/exercise, weight management, moderate sodium restriction and increased consumption of fresh fruit, vegetables, and low fat dairy, alcohol moderation, and smoking cessation.;Monitor prescription use compliance.    Expected Outcomes Short Term: Continued assessment and intervention until BP is < 140/63mm HG in hypertensive participants. < 130/61mm HG in hypertensive participants with diabetes, heart failure or chronic kidney disease.;Long Term: Maintenance of blood pressure at goal levels.     Lipids Yes    Intervention Provide education and support for participant on nutrition & aerobic/resistive exercise along with prescribed medications to achieve LDL 70mg , HDL >40mg .    Expected Outcomes Short Term: Participant states understanding of desired cholesterol values and is compliant with medications prescribed. Participant is following exercise prescription and nutrition guidelines.;Long Term: Cholesterol controlled with medications as prescribed, with individualized exercise RX and with personalized nutrition plan. Value goals: LDL < 70mg , HDL > 40 mg.             Core Components/Risk Factors/Patient Goals Review:   Goals and Risk Factor Review     Row Name 05/15/23 1426 06/14/23 1100           Core Components/Risk Factors/Patient Goals Review   Personal Goals Review Weight Management/Obesity;Hypertension;Lipids;Diabetes Weight Management/Obesity;Hypertension;Lipids;Diabetes      Review [facility] started intensive cardiac rehab on 05/15/23 and did well with exercise. Vital signs were stable. Cecelia Byars is doing  well well with exercise at cardiac rehab. Cecelia Byars has had some resting systolic BP's in the 90's has been asymptomatic. Dr Roby Lofts recently decreased toprol dose. Will continue to monitor BP's      Expected Outcomes Cecelia Byars will continue to participate in intensive cardiac rehab for exercise, nutrition and lifestyle modifications Cecelia Byars will continue to participate in intensive cardiac rehab for exercise, nutrition and lifestyle modifications               Core Components/Risk Factors/Patient Goals at Discharge (Final Review):   Goals and Risk Factor Review - 06/14/23 1100       Core Components/Risk Factors/Patient Goals Review   Personal Goals Review Weight Management/Obesity;Hypertension;Lipids;Diabetes    Review Cecelia Byars is doing well well with exercise at cardiac rehab. Cecelia Byars has had some resting systolic BP's in the 90's has been asymptomatic. Dr Roby Lofts  recently decreased toprol dose. Will continue to monitor BP's    Expected Outcomes Cecelia Byars will continue to participate in intensive cardiac rehab for exercise, nutrition and lifestyle modifications             ITP Comments:  ITP Comments     Row Name 05/04/23 1345 05/15/23 1424 06/14/23 1054       ITP Comments Armanda Magic, MD: Medical Director.  Introduction to the [employer] / Intensive Cardiac Rehab.  Initial orientation packet reviewed with the patient. 30 Day ITP review. [facility] started intensive cardiac rehab on 05/15/23 and did well with exercise 30 Day ITP review. Cecelia Byars has good attendance and participation in  intensive cardiac rehab              Comments: See ITP comments.

## 2023-07-12 ENCOUNTER — Encounter (HOSPITAL_COMMUNITY)
Admission: RE | Admit: 2023-07-12 | Discharge: 2023-07-12 | Disposition: A | Discharge status: Home / Self Care | Payer: Medicare Other | Source: Ambulatory Visit | Attending: Internal Medicine | Admitting: Internal Medicine

## 2023-07-12 DIAGNOSIS — I252 Old myocardial infarction: Secondary | ICD-10-CM | POA: Diagnosis not present

## 2023-07-12 DIAGNOSIS — Z955 Presence of coronary angioplasty implant and graft: Secondary | ICD-10-CM | POA: Diagnosis not present

## 2023-07-12 DIAGNOSIS — Z48812 Encounter for surgical aftercare following surgery on the circulatory system: Secondary | ICD-10-CM | POA: Diagnosis not present

## 2023-07-12 DIAGNOSIS — I214 Non-ST elevation (NSTEMI) myocardial infarction: Secondary | ICD-10-CM

## 2023-07-14 ENCOUNTER — Encounter (HOSPITAL_COMMUNITY)
Admission: RE | Admit: 2023-07-14 | Discharge: 2023-07-14 | Disposition: A | Discharge status: Home / Self Care | Payer: Medicare Other | Source: Ambulatory Visit | Attending: Internal Medicine | Admitting: Internal Medicine

## 2023-07-14 ENCOUNTER — Ambulatory Visit: Payer: Medicare Other | Attending: Physician Assistant | Admitting: Physician Assistant

## 2023-07-14 ENCOUNTER — Encounter: Payer: Self-pay | Admitting: Physician Assistant

## 2023-07-14 VITALS — BP 118/60 | HR 73 | Ht 65.0 in | Wt 150.4 lb

## 2023-07-14 DIAGNOSIS — I214 Non-ST elevation (NSTEMI) myocardial infarction: Secondary | ICD-10-CM | POA: Insufficient documentation

## 2023-07-14 DIAGNOSIS — Z955 Presence of coronary angioplasty implant and graft: Secondary | ICD-10-CM | POA: Diagnosis not present

## 2023-07-14 DIAGNOSIS — I255 Ischemic cardiomyopathy: Secondary | ICD-10-CM | POA: Diagnosis not present

## 2023-07-14 DIAGNOSIS — E785 Hyperlipidemia, unspecified: Secondary | ICD-10-CM

## 2023-07-14 NOTE — Patient Instructions (Signed)
Medication Instructions:  Your physician has recommended you make the following change in your medication:  1-STOP Losartan  *If you need a refill on your cardiac medications before your next appointment, please call your pharmacy*  Lab Work: If you have labs (blood work) drawn today and your tests are completely normal, you will receive your results only by: MyChart Message (if you have MyChart) OR A paper copy in the mail If you have any lab test that is abnormal or we need to change your treatment, we will call you to review the results.  Testing/Procedures: None ordered today.  Follow-Up: At [facility], you and your health needs are our priority.  As part of our continuing mission to provide you with exceptional heart care, we have created designated Provider Care Teams.  These Care Teams include your primary Cardiologist (physician) and Advanced Practice Providers (APPs -  Physician Assistants and Nurse Practitioners) who all work together to provide you with the care you need, when you need it.  We recommend signing up for the patient portal called "MyChart".  Sign up information is provided on this After Visit Summary.  MyChart is used to connect with patients for Virtual Visits (Telemedicine).  Patients are able to view lab/test results, encounter notes, upcoming appointments, etc.  Non-urgent messages can be sent to your provider as well.   To learn more about what you can do with MyChart, go to [email or web address].    Your next appointment:   6 month(s)  Provider:   Jari Favre, PA-C     Then, Orbie Pyo, MD will plan to see you again in 1 year(s).    Other Instructions -Please take your blood pressure once daily for 2 weeks, then send readings to [place] through Mychart.  -Please keep a low salt and heart healthy diet.

## 2023-07-14 NOTE — Progress Notes (Signed)
Office Visit    Patient Name: Frederick Knight Date of Encounter: 07/14/2023  PCP:  Tiffany Kocher, DO   [facility]  Cardiologist:  Orbie Pyo, MD  Advanced Practice Provider:  No care team member to display Electrophysiologist:  None   HPI    Frederick Knight is a 72 y.o. male with a past medical history of type 2 diabetes mellitus, hyperlipidemia, GERD, BPH presents today for hospital follow-up.  Patient presented to the ED with acute onset of chest pain.  Hemodynamically stable on admission.  In the ED, labs significant for troponin 2304 >>> 5049, which trended as high as 17,821.  Started on heparin, nitro, aspirin and high intensity statin day of admission.  LHC on 4/16 showed 100% stenosis of mid LAD which was stented, 85% stenosis of RCA +80% stenosis of the diagonal artery.  Underwent second [facility] on 4/17 with stent placed to RCA.  Cardiology had recommended medical management of the diagonal lesion.  Antiplatelet and anticoagulation plan was aspirin plus Plavix plus Eliquis x 1 month then Plavix and Eliquis alone until anterior akinesis improves.  If AKI improves, add back aspirin and complete DAPT for 1 year.  After 1 year of DAPT transition to Plavix monotherapy indefinitely.  Lipid panel at goal and continue atorvastatin 80 mg at discharge.  Chest pain resolved patient was discharged 03/30/2023 in stable condition.  Patient also had a diagnosis of ischemic cardiomyopathy with LVEF 45 to 50%, LV mild decreased function with regional wall motion abnormalities and diastolic parameters consistent with grade 1 DD.  Started on metoprolol succinate, Farxiga, and losartan for GDMT.  He was last seen 4/24, he tells me that he is doing well with his new medications.  Tolerating Eliquis 5 mg twice a day as well as Farxiga and metoprolol.  He is worried that his blood pressure has been low normal.  We discussed decreasing his losartan.  Otherwise plan for follow-up  echocardiogram.  Some left arm pain but he thinks it could be musculoskeletal since he lays on that side at night.  We encourage participation in cardiac rehab and sent a referral today.  Today, he is doing well. His BP is low normal today. He has some dizziness in the mornings and have been getting some low Bps at home.  Otherwise, doing okay.  He has 3 granddaughters that he enjoys spending time with.  Tolerating Plavix and Eliquis, off aspirin.  We discussed supplements that are okay to take with his heart medications.  Advised not to take vitamin A since it is a blood thinner.  We asked him to keep track of his blood pressure with this new change for the next couple of weeks.  Reports no shortness of breath nor dyspnea on exertion. Reports no chest pain, pressure, or tightness. No edema, orthopnea, PND. Reports no palpitations.    Past Medical History    Past Medical History:  Diagnosis Date   Diabetes mellitus without complication (HCC)    Hyperlipemia    Controlled well with medications   Past Surgical History:  Procedure Laterality Date   COLONOSCOPY N/A 01/15/2015   RMR:  Multiple polyps removed as described above.   COLONOSCOPY  01/28/2021   [facility]   COLONOSCOPY  2016   CORONARY STENT INTERVENTION N/A 03/29/2023   Procedure: CORONARY STENT INTERVENTION;  Surgeon: [place]az[place], Peter M, MD;  Location: [facility] INVASIVE CV LAB;  Service: Cardiovascular;  Laterality: N/A;   CORONARY STENT  INTERVENTION N/A 03/28/2023   Procedure: CORONARY STENT INTERVENTION;  Surgeon: Lennette Bihari, MD;  Location: [facility] INVASIVE CV LAB;  Service: Cardiovascular;  Laterality: N/A;   CRANIOTOMY  05/16/2012   Procedure: CRANIOTOMY HEMATOMA EVACUATION SUBDURAL;  Surgeon: Mariam Dollar, MD;  Location: [facility] NEURO ORS;  Service: Neurosurgery;  Laterality: N/A;  Right burr holes for evacuation of subdural hematoma   CRANIOTOMY  05/18/2012   Procedure: CRANIOTOMY HEMATOMA EVACUATION SUBDURAL;  Surgeon: Mariam Dollar,  MD;  Location: [facility] NEURO ORS;  Service: Neurosurgery;  Laterality: Right;   LEFT HEART CATH AND CORONARY ANGIOGRAPHY N/A 03/28/2023   Procedure: LEFT HEART CATH AND CORONARY ANGIOGRAPHY;  Surgeon: Lennette Bihari, MD;  Location: [facility] INVASIVE CV LAB;  Service: Cardiovascular;  Laterality: N/A;    Allergies  No Known Allergies   EKGs/Labs/Other Studies Reviewed:   The following studies were reviewed today: Cardiac Studies & Procedures   CARDIAC CATHETERIZATION  CARDIAC CATHETERIZATION 03/29/2023  Narrative   Dist RCA lesion is 85% stenosed.   A drug-eluting stent was successfully placed using a SYNERGY XD 4.0X12.   Post intervention, there is a 0% residual stenosis.  Successful PCI of the distal RCA with DES  Plan; DAPT for one year. Anticipate DC tomorrow.  Findings Coronary Findings Diagnostic  Dominance: Right  Left Anterior Descending Non-stenotic Mid LAD lesion was previously treated. Non-stenotic Mid LAD to Dist LAD lesion.  First Diagonal Branch Vessel is small in size. 1st Diag lesion is 80% stenosed.  Second Diagonal Branch Vessel is small in size.  Ramus Intermedius Ramus lesion is 20% stenosed.  Left Circumflex Vessel is small.  Right Coronary Artery Vessel is large. The vessel exhibits minimal luminal irregularities. Dist RCA lesion is 85% stenosed.  Right Posterior Descending Artery RPDA lesion is 40% stenosed.  Second Right Posterolateral Branch 2nd RPL lesion is 20% stenosed.  Intervention  Dist RCA lesion Stent CATHETER LAUNCHER 6FR AL1 guide catheter was inserted. Lesion crossed with guidewire using a WIRE ASAHI PROWATER 180CM. Pre-stent angioplasty was performed using a BALLN EMERGE MR 2.5X12. A drug-eluting stent was successfully placed using a SYNERGY XD 4.0X12. Stent strut is well apposed. Post-stent angioplasty was performed using a BALL SAPPHIRE NC24 4.5X10. Maximum pressure:  16 atm. Post-Intervention Lesion Assessment The intervention  was successful. Pre-interventional TIMI flow is 3. Post-intervention TIMI flow is 3. No complications occurred at this lesion. There is a 0% residual stenosis post intervention.   CARDIAC CATHETERIZATION 03/28/2023  Narrative   Dist RCA lesion is 85% stenosed.   RPDA lesion is 40% stenosed.   Mid LAD lesion is 100% stenosed.   2nd RPL lesion is 20% stenosed.   Ramus lesion is 20% stenosed.   1st Diag lesion is 80% stenosed.   Non-stenotic Mid LAD to Dist LAD lesion.   A drug-eluting stent was successfully placed.   Post intervention, there is a 0% residual stenosis.   The left ventricular ejection fraction is 50-55% by visual estimate.  Acute coronary syndrome secondary to total LAD occlusion after the first diagonal vessel with 80% stenosis in a small caliber first diagonal vessel proximal to the total occlusion.  Mild 20% stenosis in the ramus intermediate vessel.  Small normal left circumflex vessel.  Large dominant RCA with mild luminal irregularity and focal 85% stenosis proximal to the PDA takeoff with 40% mid PDA stenosis and 20% PL stenosis.  Mild LV dysfunction with mid distal anterolateral and apical hypocontractility.  EF estimate approximately 50%.  LVEDP 22 mm.  Successful PCI to the total LAD occlusion with ultimate insertion of a 3.0 x 15 mm Onyx frontier DES stent postdilated to 3.25 mm with 100% occlusion being reduced to 0%.  TIMI 0 flow was improved to TIMI-3 flow.  There is evidence for significant systolic bridging of the mid LAD proximal to the mid diagonal vessel.  RECOMMENDATION: DAPT with aspirin/Brilinta for minimum of 1 year.  Will hydrate postprocedure.  Plan for staged PCI to his distal RCA which is a large-caliber vessel.  Medical therapy versus PCI to the diagonal vessel.  Findings Coronary Findings Diagnostic  Dominance: Right  Left Anterior Descending Mid LAD lesion is 100% stenosed. Non-stenotic Mid LAD to Dist LAD lesion.  First Diagonal  Branch Vessel is small in size. 1st Diag lesion is 80% stenosed.  Second Diagonal Branch Vessel is small in size.  Ramus Intermedius Ramus lesion is 20% stenosed.  Left Circumflex Vessel is small.  Right Coronary Artery Vessel is large. The vessel exhibits minimal luminal irregularities. Dist RCA lesion is 85% stenosed.  Right Posterior Descending Artery RPDA lesion is 40% stenosed.  Second Right Posterolateral Branch 2nd RPL lesion is 20% stenosed.  Intervention  Mid LAD lesion Stent A drug-eluting stent was successfully placed. Stent strut is well apposed. Post-Intervention Lesion Assessment The intervention was successful. Pre-interventional TIMI flow is 0. Post-intervention TIMI flow is 3. No complications occurred at this lesion. There is a 0% residual stenosis post intervention.     ECHOCARDIOGRAM  ECHOCARDIOGRAM LIMITED 05/09/2023  Narrative ECHOCARDIOGRAM LIMITED REPORT    Patient Name:   ZAYD BONET Date of Exam: 05/09/2023 Medical Rec #:  [id number]        Height:       68.0 in Accession #:    [id number]       Weight:       159.0 lb Date of Birth:  01/29/1951        BSA:          1.854 m Patient Age:    72 years         BP:           102/60 mmHg Patient Gender: M                HR:           99 bpm. Exam Location:  [address]  Procedure: Limited Echo, Cardiac Doppler, Limited Color Doppler and 2D Echo  Indications:    I25.5 Ischemic Cardiomyopathy  History:        Patient has prior history of Echocardiogram examinations, most recent 03/28/2023. Previous Myocardial Infarction, Signs/Symptoms:Fatigue and Chest Pain; Risk Factors:Dyslipidemia and Diabetes. Ischemic Cardiomyopathy (prior EF 45%) with NSTEMI.  Sonographer:    Farrel Conners RDCS Referring Phys: Bernadette Hoit Jolissa Kapral  IMPRESSIONS   1. Left ventricular ejection fraction, by estimation, is 60 to 65%. Left ventricular ejection fraction by PLAX is 65 %. The left ventricle has normal  function. The left ventricle demonstrates regional wall motion abnormalities (see scoring diagram/findings for description). There is mild left ventricular hypertrophy. Left ventricular diastolic parameters are consistent with Grade I diastolic dysfunction (impaired relaxation). There is moderate hypokinesis of the left ventricular, mid-apical anteroseptal wall and apical segment. 2. Right ventricular systolic function is normal. The right ventricular size is normal. 3. The aortic valve is tricuspid. Aortic valve regurgitation is not visualized.  Comparison(s): Changes from prior study are noted. 03/28/2023: LVEF 45-50%, distal LAD territory [facility].  FINDINGS Left Ventricle: Left ventricular ejection  fraction, by estimation, is 60 to 65%. Left ventricular ejection fraction by PLAX is 65 %. The left ventricle has normal function. The left ventricle demonstrates regional wall motion abnormalities. Moderate hypokinesis of the left ventricular, mid-apical anteroseptal wall and apical segment. The left ventricular internal cavity size was normal in size. There is mild left ventricular hypertrophy. Left ventricular diastolic parameters are consistent with Grade I diastolic dysfunction (impaired relaxation). Indeterminate filling pressures.   LV Wall Scoring: The apical septal segment and apex are hypokinetic.  Right Ventricle: The right ventricular size is normal. No increase in right ventricular wall thickness. Right ventricular systolic function is normal.  Left Atrium: Left atrial size was normal in size.  Right Atrium: Right atrial size was normal in size.  Aortic Valve: The aortic valve is tricuspid. Aortic valve regurgitation is not visualized.  Aorta: The aortic root and ascending aorta are structurally normal, with no evidence of dilitation.  IAS/Shunts: No atrial level shunt detected by color flow Doppler.  LEFT VENTRICLE PLAX 2D LV EF:         Left            Diastology ventricular      LV e' medial:    7.65 cm/s ejection        LV E/e' medial:  8.2 fraction by     LV e' lateral:   12.50 cm/s PLAX is 65      LV E/e' lateral: 5.0 %. LVIDd:         2.90 cm LVIDs:         1.90 cm LV PW:         0.80 cm LV IVS:        1.10 cm LVOT diam:     2.10 cm LV SV:         69 LV SV Index:   37 LVOT Area:     3.46 cm  LV Volumes (MOD) LV vol d, MOD    56.9 ml A2C: LV vol d, MOD    79.9 ml A4C: LV vol s, MOD    12.5 ml A2C: LV vol s, MOD    35.1 ml A4C: LV SV MOD A2C:   44.4 ml LV SV MOD A4C:   79.9 ml LV SV MOD BP:    46.8 ml  RIGHT VENTRICLE RV Basal diam:  2.20 cm RV S prime:     15.00 cm/s TAPSE (M-mode): 2.4 cm  LEFT ATRIUM             Index        RIGHT ATRIUM           Index LA diam:        2.50 cm 1.35 cm/m   RA Pressure: 3.00 mmHg LA Vol (A2C):   38.0 ml 20.50 ml/m  RA Area:     15.60 cm LA Vol (A4C):   36.3 ml 19.58 ml/m  RA Volume:   37.40 ml  20.18 ml/m LA Biplane Vol: 39.3 ml 21.20 ml/m AORTIC VALVE LVOT Vmax:   126.50 cm/s LVOT Vmean:  86.950 cm/s LVOT VTI:    0.200 m  AORTA Ao Root diam: 3.60 cm Ao Asc diam:  3.50 cm  MITRAL VALVE                TRICUSPID VALVE MV Area (PHT): cm          Estimated RAP:  3.00 mmHg MV Decel Time: 167 msec MV E velocity: 62.70 cm/s  SHUNTS MV A velocity: 135.00 cm/s  Systemic VTI:  0.20 m MV E/A ratio:  0.46         Systemic Diam: 2.10 cm  Zoila Shutter MD Electronically signed by Zoila Shutter MD Signature Date/Time: 05/09/2023/10:27:40 PM    Final              EKG:  EKG is not ordered today.   Recent Labs: 03/28/2023: Magnesium 2.1; TSH 6.798 04/06/2023: ALT 44; BUN 20; Creatinine, Ser 1.00; Hemoglobin 15.8; Platelets 200; Potassium 4.0; Sodium 139  Recent Lipid Panel    Component Value Date/Time   CHOL 114 03/29/2023 0104   CHOL 151 01/04/2022 1133   TRIG 79 03/29/2023 0104   HDL 45 03/29/2023 0104   HDL 44 01/04/2022 1133   CHOLHDL 2.5 03/29/2023 0104   VLDL 16 03/29/2023 0104    LDLCALC 53 03/29/2023 0104   LDLCALC 84 01/04/2022 1133    Home Medications   Current Meds  Medication Sig   acetaminophen (TYLENOL) 325 MG tablet Take 2 tablets (650 mg total) by mouth every 4 (four) hours as needed for headache or mild pain.   apixaban (ELIQUIS) 5 MG TABS tablet TAKE 1 TABLET BY MOUTH TWICE A DAY   atorvastatin (LIPITOR) 80 MG tablet Take 1 tablet (80 mg total) by mouth daily.   clopidogrel (PLAVIX) 75 MG tablet TAKE 1 TABLET BY MOUTH EVERY DAY   empagliflozin (JARDIANCE) 10 MG TABS tablet TAKE 1 TABLET BY MOUTH EVERY DAY   metFORMIN (GLUCOPHAGE-XR) 500 MG 24 hr tablet TAKE 1 TABLET BY MOUTH EVERY DAY WITH BREAKFAST   metoprolol succinate (TOPROL XL) 25 MG 24 hr tablet Take 1 tablet (25 mg total) by mouth daily.   tamsulosin (FLOMAX) 0.4 MG CAPS capsule Take 1 capsule (0.4 mg total) by mouth daily.   [DISCONTINUED] losartan (COZAAR) 25 MG tablet Take 0.5 tablets (12.5 mg total) by mouth at bedtime.     Review of Systems      All other systems reviewed and are otherwise negative except as noted above.  Physical Exam    VS:  BP 118/60   Pulse 73   Ht 5\' 5"  (1.651 m)   Wt 150 lb 6.4 oz (68.2 kg)   SpO2 97%   BMI 25.03 kg/m  , BMI Body mass index is 25.03 kg/m.  Wt Readings from Last 3 Encounters:  07/14/23 150 lb 6.4 oz (68.2 kg)  05/04/23 158 lb 15.2 oz (72.1 kg)  04/10/23 162 lb (73.5 kg)     GEN: Well nourished, well developed, in no acute distress. HEENT: normal. Neck: Supple, no JVD, carotid bruits, or masses. Cardiac: RRR, no murmurs, rubs, or gallops. No clubbing, cyanosis, edema.  Radials/PT 2+ and equal bilaterally.  Respiratory:  Respirations regular and unlabored, clear to auscultation bilaterally. GI: Soft, nontender, nondistended. MS: No deformity or atrophy. Skin: Warm and dry, no rash. Neuro:  Strength and sensation are intact. Psych: Normal affect. Some ecchymosis from cath site, no hematoma  Assessment & Plan    NSTEMI with  PCI -No further chest pain -Continue current medication regimen which includes Eliquis 5 mg twice a day, Lipitor 80 mg daily, Plavix 75 mg daily, Jardiance 10 mg daily, losartan stopped, metoprolol succinate 25 mg daily -cont Eliquis and Plavix until April 2025  Ischemic CM -Update reviewed -Euvolemic on exam -Continue current medication regimen  3. Hyperlipidemia -Most recent lipid panel showed LDL 53, at goal -Continue Lipitor 80 mg daily  Disposition: Follow up 6 months with Orbie Pyo, MD or APP.  Signed, Sharlene Dory, PA-C 07/14/2023, 4:59 PM [facility]

## 2023-07-17 ENCOUNTER — Encounter (HOSPITAL_COMMUNITY)
Admission: RE | Admit: 2023-07-17 | Discharge: 2023-07-17 | Disposition: A | Discharge status: Home / Self Care | Payer: Medicare Other | Source: Ambulatory Visit | Attending: Internal Medicine | Admitting: Internal Medicine

## 2023-07-17 DIAGNOSIS — I214 Non-ST elevation (NSTEMI) myocardial infarction: Secondary | ICD-10-CM | POA: Diagnosis not present

## 2023-07-17 DIAGNOSIS — Z955 Presence of coronary angioplasty implant and graft: Secondary | ICD-10-CM | POA: Diagnosis not present

## 2023-07-19 ENCOUNTER — Encounter (HOSPITAL_COMMUNITY)
Admission: RE | Admit: 2023-07-19 | Discharge: 2023-07-19 | Disposition: A | Discharge status: Home / Self Care | Payer: Medicare Other | Source: Ambulatory Visit | Attending: Internal Medicine | Admitting: Internal Medicine

## 2023-07-19 DIAGNOSIS — Z955 Presence of coronary angioplasty implant and graft: Secondary | ICD-10-CM

## 2023-07-19 DIAGNOSIS — I214 Non-ST elevation (NSTEMI) myocardial infarction: Secondary | ICD-10-CM | POA: Diagnosis not present

## 2023-07-21 ENCOUNTER — Encounter (HOSPITAL_COMMUNITY)
Admission: RE | Admit: 2023-07-21 | Discharge: 2023-07-21 | Disposition: A | Discharge status: Home / Self Care | Payer: Medicare Other | Source: Ambulatory Visit | Attending: Internal Medicine | Admitting: Internal Medicine

## 2023-07-21 DIAGNOSIS — I214 Non-ST elevation (NSTEMI) myocardial infarction: Secondary | ICD-10-CM | POA: Diagnosis not present

## 2023-07-21 DIAGNOSIS — Z955 Presence of coronary angioplasty implant and graft: Secondary | ICD-10-CM | POA: Diagnosis not present

## 2023-07-23 ENCOUNTER — Other Ambulatory Visit: Payer: Self-pay | Admitting: Physician Assistant

## 2023-07-24 ENCOUNTER — Encounter (HOSPITAL_COMMUNITY)
Admission: RE | Admit: 2023-07-24 | Discharge: 2023-07-24 | Disposition: A | Discharge status: Home / Self Care | Payer: Medicare Other | Source: Ambulatory Visit | Attending: Internal Medicine | Admitting: Internal Medicine

## 2023-07-24 VITALS — Ht 68.0 in | Wt 149.9 lb

## 2023-07-24 DIAGNOSIS — I214 Non-ST elevation (NSTEMI) myocardial infarction: Secondary | ICD-10-CM | POA: Diagnosis not present

## 2023-07-24 DIAGNOSIS — Z955 Presence of coronary angioplasty implant and graft: Secondary | ICD-10-CM

## 2023-07-25 NOTE — Progress Notes (Signed)
Discharge Progress Report  Patient Details  Name: Frederick Knight MRN: [id number] Date of Birth: 1951-01-22 Referring Provider:   Flowsheet Row INTENSIVE CARDIAC REHAB ORIENT from 05/04/2023 in [facility]  Referring Provider Alverda Skeans, MD        Number of Visits: 32  Reason for Discharge:  Patient reached a stable level of exercise. Patient independent in their exercise. Patient has met program and personal goals.  Smoking History:  Social History   Tobacco Use  Smoking Status Never  Smokeless Tobacco Never    Diagnosis:  03/28/23 NSTEMI (non-ST elevated myocardial infarction) (HCC)  03/29/23 S/P drug eluting coronary stent placement, LAD/ RCA  ADL UCSD:   Initial Exercise Prescription:  Initial Exercise Prescription - 05/04/23 1500       Date of Initial Exercise RX and Referring Provider   Date 05/04/23    Referring Provider Alverda Skeans, MD    Expected Discharge Date 07/14/23      NuStep   Level 2    SPM 75    Minutes 15    METs 2.7      Arm Ergometer   Level 1    Watts 25    RPM 60    Minutes 15    METs 2.7      Prescription Details   Frequency (times per week) 3    Duration Progress to 30 minutes of continuous aerobic without signs/symptoms of physical distress      Intensity   THRR 40-80% of Max Heartrate 59-118    Ratings of Perceived Exertion 11-13    Perceived Dyspnea 0-4      Progression   Progression Continue progressive overload as per policy without signs/symptoms or physical distress.      Resistance Training   Training Prescription Yes    Weight 3 lbs    Reps 10-15             Discharge Exercise Prescription (Final Exercise Prescription Changes):  Exercise Prescription Changes - 07/28/23 1641       Response to Exercise   Blood Pressure (Admit) 110/64    Blood Pressure (Exercise) 164/78    Blood Pressure (Exit) 102/62    Heart Rate (Admit) 71 bpm     Heart Rate (Exercise) 136 bpm    Heart Rate (Exit) 85 bpm    Rating of Perceived Exertion (Exercise) 11    Symptoms None    Comments Pt graduated from the cRP2 program today.    Duration Continue with 30 min of aerobic exercise without signs/symptoms of physical distress.    Intensity THRR unchanged      Progression   Progression Continue to progress workloads to maintain intensity without signs/symptoms of physical distress.    Average METs 4.4      Resistance Training   Training Prescription Yes    Weight 4 lbs    Reps 10-15    Time 10 Minutes      Interval Training   Interval Training No      Recumbant Bike   Level 4    RPM 76    Watts 65    Minutes 15    METs 4.8      NuStep   Level 4    SPM 116    Minutes 15    METs 4      Home Exercise Plan   Plans to continue exercise at Home (comment)    Frequency Add  3 additional days to program exercise sessions.    Initial Home Exercises Provided 06/28/23             Functional Capacity:  6 Minute Walk     Row Name 05/04/23 1430 07/19/23 1255       6 Minute Walk   Phase Initial Discharge    Distance 1177 feet 1670 feet    Distance % Change -- 41.89 %    Distance Feet Change -- 493 ft    Walk Time 6 minutes 6 minutes    # of Rest Breaks 0 0    MPH 2.23 3.2    METS 2.73 3.63    RPE 8 11    Perceived Dyspnea  0 0    VO2 Peak 9.6 12.7    Symptoms No No    Resting HR 95 bpm 70 bpm    Resting BP 122/68 110/60    Resting Oxygen Saturation  96 % --    Exercise Oxygen Saturation  during 6 min walk 96 % --    Max Ex. HR 105 bpm 100 bpm    Max Ex. BP 124/60 124/68    2 Minute Post BP 108/60 --             6 Minute Walk     Row Name 05/04/23 1430 07/19/23 1255       6 Minute Walk   Phase Initial Discharge    Distance 1177 feet 1670 feet    Distance % Change -- 41.89 %    Distance Feet Change -- 493 ft    Walk Time 6 minutes 6 minutes    # of Rest Breaks 0 0    MPH 2.23 3.2    METS 2.73 3.63     RPE 8 11    Perceived Dyspnea  0 0    VO2 Peak 9.6 12.7    Symptoms No No    Resting HR 95 bpm 70 bpm    Resting BP 122/68 110/60    Resting Oxygen Saturation  96 % --    Exercise Oxygen Saturation  during 6 min walk 96 % --    Max Ex. HR 105 bpm 100 bpm    Max Ex. BP 124/60 124/68    2 Minute Post BP 108/60 --             Psychological, QOL, Others - Outcomes: PHQ 2/9:    07/24/2023    3:03 PM 05/04/2023    3:05 PM 04/06/2023    3:02 PM 12/15/2022    2:15 PM 04/14/2022   12:39 PM  Depression screen PHQ 2/9  Decreased Interest 0 0 0 0 0  Down, Depressed, Hopeless 0 0 0 0 0  PHQ - 2 Score 0 0 0 0 0  Altered sleeping 0 0 2 0 0  Tired, decreased energy 1 1 2  0 0  Change in appetite 0 0 0 0 0  Feeling bad or failure about yourself  0 0 0 0 0  Trouble concentrating 0 0 0 0 0  Moving slowly or fidgety/restless 0 0 0 0 0  Suicidal thoughts 0 0 0 0 0  PHQ-9 Score 1 1 4  0 0  Difficult doing work/chores Not difficult at all Not difficult at all Somewhat difficult Not difficult at all Not difficult at all    Quality of Life:  Quality of Life - 07/19/23 1500       Quality of Life Scores  Health/Function Pre 28.77 %    Health/Function Post 25.5 %    Health/Function % Change -11.37 %    Socioeconomic Pre 29.06 %    Socioeconomic Post 25.5 %    Socioeconomic % Change  -12.25 %    Psych/Spiritual Pre 30 %    Psych/Spiritual Post 27.57 %    Psych/Spiritual % Change -8.1 %    Family Pre 27.6 %    Family Post 24.7 %    Family % Change -10.51 %    GLOBAL Pre 28.91 %    GLOBAL Post 25.76 %    GLOBAL % Change -10.9 %             Personal Goals: Goals established at orientation with interventions provided to work toward goal.  Personal Goals and Risk Factors at Admission - 05/04/23 1509       Core Components/Risk Factors/Patient Goals on Admission   Diabetes Yes    Intervention Provide education about signs/symptoms and action to take for hypo/hyperglycemia.;Provide  education about proper nutrition, including hydration, and aerobic/resistive exercise prescription along with prescribed medications to achieve blood glucose in normal ranges: Fasting glucose 65-99 mg/dL    Expected Outcomes Short Term: Participant verbalizes understanding of the signs/symptoms and immediate care of hyper/hypoglycemia, proper foot care and importance of medication, aerobic/resistive exercise and nutrition plan for blood glucose control.;Long Term: Attainment of HbA1C < 7%.    Hypertension Yes    Intervention Provide education on lifestyle modifcations including regular physical activity/exercise, weight management, moderate sodium restriction and increased consumption of fresh fruit, vegetables, and low fat dairy, alcohol moderation, and smoking cessation.;Monitor prescription use compliance.    Expected Outcomes Short Term: Continued assessment and intervention until BP is < 140/76mm HG in hypertensive participants. < 130/46mm HG in hypertensive participants with diabetes, heart failure or chronic kidney disease.;Long Term: Maintenance of blood pressure at goal levels.    Lipids Yes    Intervention Provide education and support for participant on nutrition & aerobic/resistive exercise along with prescribed medications to achieve LDL 70mg , HDL >40mg .    Expected Outcomes Short Term: Participant states understanding of desired cholesterol values and is compliant with medications prescribed. Participant is following exercise prescription and nutrition guidelines.;Long Term: Cholesterol controlled with medications as prescribed, with individualized exercise RX and with personalized nutrition plan. Value goals: LDL < 70mg , HDL > 40 mg.              Personal Goals Discharge:  Goals and Risk Factor Review     Row Name 05/15/23 1426 06/14/23 1100 07/11/23 1646 07/25/23 1239       Core Components/Risk Factors/Patient Goals Review   Personal Goals Review Weight  Management/Obesity;Hypertension;Lipids;Diabetes Weight Management/Obesity;Hypertension;Lipids;Diabetes Weight Management/Obesity;Hypertension;Lipids;Diabetes Weight Management/Obesity;Hypertension;Lipids;Diabetes    Review Frederick Knight started intensive cardiac rehab on 05/15/23 and did well with exercise. Vital signs were stable. Frederick Knight is doing well well with exercise at cardiac rehab. Frederick Knight has had some resting systolic BP's in the 90's has been asymptomatic. Dr Roby Lofts recently decreased toprol dose. Will continue to monitor BP's Frederick Knight continues to do  well with exercise at cardiac rehab. vital  sign have been stable. Frederick Knight has lost 4.0 kg since starting cardiac rehab. Frederick Knight will complete cardiac rehab on 07/28/23 Frederick Knight is graduated the Intensive/Traditional Cardiac Program on 07/28/23 completing 32 exercise sessions. Pt opted not to attend any education classes. Core Component Goals: Goal not met for weight maintenance/weight gain. Frederick Knight lost 9# since starting the program. Our dietitian attempted to work with him  to gain weight, but he was reluctant to make changes. He met his goal for blood pressure control. Frederick Knight's BPs were low starting in the program, but he worked with his MD to titrate the BP med doses down. Goal met for diabetes control with A1c < 7%. On 03/17/22, Frederick Knight's A1c was 6.1%. He is compliant in taking his lipid medication, atorvastatin. Last lipid panel was WNL. For stress relief Frederick Knight likes to visit friends and family, be outdoors, spend time with his cat, and walk in the park. Frederick Knight plans to continue exercising at the [facility]. We are proud of Frederick Knight's success in the program!    Expected Outcomes Frederick Knight will continue to participate in intensive cardiac rehab for exercise, nutrition and lifestyle modifications Frederick Knight will continue to participate in intensive cardiac rehab for exercise, nutrition and lifestyle modifications Frederick Knight will continue to participate in intensive cardiac rehab for  exercise, nutrition and lifestyle modifications Frederick Knight will continue to participate in exercise, nutrition and lifestyle modifications after graduation from the CR program on 07/28/23.             Goals and Risk Factor Review     Row Name 05/15/23 1426 06/14/23 1100 07/11/23 1646 07/25/23 1239       Core Components/Risk Factors/Patient Goals Review   Personal Goals Review Weight Management/Obesity;Hypertension;Lipids;Diabetes Weight Management/Obesity;Hypertension;Lipids;Diabetes Weight Management/Obesity;Hypertension;Lipids;Diabetes Weight Management/Obesity;Hypertension;Lipids;Diabetes    Review Frederick Knight started intensive cardiac rehab on 05/15/23 and did well with exercise. Vital signs were stable. Frederick Knight is doing well well with exercise at cardiac rehab. Frederick Knight has had some resting systolic BP's in the 90's has been asymptomatic. Dr Roby Lofts recently decreased toprol dose. Will continue to monitor BP's Frederick Knight continues to do  well with exercise at cardiac rehab. vital  sign have been stable. Frederick Knight has lost 4.0 kg since starting cardiac rehab. Frederick Knight will complete cardiac rehab on 07/28/23 Frederick Knight is graduated the Intensive/Traditional Cardiac Program on 07/28/23 completing 32 exercise sessions. Pt opted not to attend any education classes. Core Component Goals: Goal not met for weight maintenance/weight gain. Frederick Knight lost 9# since starting the program. Our dietitian attempted to work with him to gain weight, but he was reluctant to make changes. He met his goal for blood pressure control. Frederick Knight's BPs were low starting in the program, but he worked with his MD to titrate the BP med doses down. Goal met for diabetes control with A1c < 7%. On 03/17/22, Frederick Knight's A1c was 6.1%. He is compliant in taking his lipid medication, atorvastatin. Last lipid panel was WNL. For stress relief Frederick Knight likes to visit friends and family, be outdoors, spend time with his cat, and walk in the park. Frederick Knight plans to continue  exercising at the [facility]. We are proud of Frederick Knight's success in the program!    Expected Outcomes Frederick Knight will continue to participate in intensive cardiac rehab for exercise, nutrition and lifestyle modifications Frederick Knight will continue to participate in intensive cardiac rehab for exercise, nutrition and lifestyle modifications Frederick Knight will continue to participate in intensive cardiac rehab for exercise, nutrition and lifestyle modifications Frederick Knight will continue to participate in exercise, nutrition and lifestyle modifications after graduation from the CR program on 07/28/23.             Exercise Goals and Review:  Exercise Goals     Row Name 05/04/23 1511             Exercise Goals   Increase Physical Activity Yes       Intervention Provide advice, education, support and  counseling about physical activity/exercise needs.;Develop an individualized exercise prescription for aerobic and resistive training based on initial evaluation findings, risk stratification, comorbidities and participant's personal goals.       Expected Outcomes Short Term: Attend rehab on a regular basis to increase amount of physical activity.;Long Term: Add in home exercise to make exercise part of routine and to increase amount of physical activity.;Long Term: Exercising regularly at least 3-5 days a week.       Increase Strength and Stamina Yes       Intervention Provide advice, education, support and counseling about physical activity/exercise needs.;Develop an individualized exercise prescription for aerobic and resistive training based on initial evaluation findings, risk stratification, comorbidities and participant's personal goals.       Expected Outcomes Short Term: Increase workloads from initial exercise prescription for resistance, speed, and METs.;Short Term: Perform resistance training exercises routinely during rehab and add in resistance training at home;Long Term: Improve cardiorespiratory fitness, muscular  endurance and strength as measured by increased METs and functional capacity ( )       Able to understand and use rate of perceived exertion (RPE) scale Yes       Intervention Provide education and explanation on how to use RPE scale       Expected Outcomes Short Term: Able to use RPE daily in rehab to express subjective intensity level;Long Term:  Able to use RPE to guide intensity level when exercising independently       Knowledge and understanding of Target Heart Rate Range (THRR) Yes       Intervention Provide education and explanation of THRR including how the numbers were predicted and where they are located for reference       Expected Outcomes Short Term: Able to state/look up THRR;Long Term: Able to use THRR to govern intensity when exercising independently;Short Term: Able to use daily as guideline for intensity in rehab       Understanding of Exercise Prescription Yes       Intervention Provide education, explanation, and written materials on patient's individual exercise prescription       Expected Outcomes Short Term: Able to explain program exercise prescription;Long Term: Able to explain home exercise prescription to exercise independently                Exercise Goals     Row Name 05/04/23 1511             Exercise Goals   Increase Physical Activity Yes       Intervention Provide advice, education, support and counseling about physical activity/exercise needs.;Develop an individualized exercise prescription for aerobic and resistive training based on initial evaluation findings, risk stratification, comorbidities and participant's personal goals.       Expected Outcomes Short Term: Attend rehab on a regular basis to increase amount of physical activity.;Long Term: Add in home exercise to make exercise part of routine and to increase amount of physical activity.;Long Term: Exercising regularly at least 3-5 days a week.       Increase Strength and Stamina Yes        Intervention Provide advice, education, support and counseling about physical activity/exercise needs.;Develop an individualized exercise prescription for aerobic and resistive training based on initial evaluation findings, risk stratification, comorbidities and participant's personal goals.       Expected Outcomes Short Term: Increase workloads from initial exercise prescription for resistance, speed, and METs.;Short Term: Perform resistance training exercises routinely during rehab and add in resistance training at home;Long  Term: Improve cardiorespiratory fitness, muscular endurance and strength as measured by increased METs and functional capacity ( )       Able to understand and use rate of perceived exertion (RPE) scale Yes       Intervention Provide education and explanation on how to use RPE scale       Expected Outcomes Short Term: Able to use RPE daily in rehab to express subjective intensity level;Long Term:  Able to use RPE to guide intensity level when exercising independently       Knowledge and understanding of Target Heart Rate Range (THRR) Yes       Intervention Provide education and explanation of THRR including how the numbers were predicted and where they are located for reference       Expected Outcomes Short Term: Able to state/look up THRR;Long Term: Able to use THRR to govern intensity when exercising independently;Short Term: Able to use daily as guideline for intensity in rehab       Understanding of Exercise Prescription Yes       Intervention Provide education, explanation, and written materials on patient's individual exercise prescription       Expected Outcomes Short Term: Able to explain program exercise prescription;Long Term: Able to explain home exercise prescription to exercise independently                Exercise Goals Re-Evaluation:  Exercise Goals Re-Evaluation     Row Name 05/15/23 1442 06/07/23 1444 07/05/23 1500 07/28/23 1643       Exercise Goal  Re-Evaluation   Exercise Goals Review Increase Physical Activity;Increase Strength and Stamina;Able to understand and use rate of perceived exertion (RPE) scale;Knowledge and understanding of Target Heart Rate Range (THRR);Understanding of Exercise Prescription Increase Physical Activity;Increase Strength and Stamina;Able to understand and use rate of perceived exertion (RPE) scale;Knowledge and understanding of Target Heart Rate Range (THRR);Understanding of Exercise Prescription Increase Physical Activity;Increase Strength and Stamina;Able to understand and use rate of perceived exertion (RPE) scale;Knowledge and understanding of Target Heart Rate Range (THRR);Understanding of Exercise Prescription Increase Physical Activity;Increase Strength and Stamina;Able to understand and use rate of perceived exertion (RPE) scale;Knowledge and understanding of Target Heart Rate Range (THRR);Understanding of Exercise Prescription    Comments Pt's first day in the CRP2 program. Pt understnads the exercise Rx, RPE scale and THRR. Reviewed METs and goals. Reviewed METs and goals. Pt voices he has made good progress on his goal of increased strength and stamina. He also has achieved another goal and is back into a regaular exercise program on off days form the CRR2 program Pt graduated from the CRP2 program today. Pt had peak METs of 5.0. Pt achieved his goal of increased strength and stamina and he is exercisng regularly at home which was also a goal. Pt will be going to the gym and walking at home. Pt is currently exercising 6-7x/week.    Expected Outcomes Will continue to monitor the patient and progress exercise workloads as tolerated. Will continue to monitor the patient and progress exercise workloads as tolerated. Will continue to monitor the patient and progress exercise workloads as tolerated. Pt will continue to exericse at home/gym.             Exercise Goals Re-Evaluation     Row Name 05/15/23 1442  06/07/23 1444 07/05/23 1500 07/28/23 1643       Exercise Goal Re-Evaluation   Exercise Goals Review Increase Physical Activity;Increase Strength and Stamina;Able to understand and use rate of perceived exertion (  RPE) scale;Knowledge and understanding of Target Heart Rate Range (THRR);Understanding of Exercise Prescription Increase Physical Activity;Increase Strength and Stamina;Able to understand and use rate of perceived exertion (RPE) scale;Knowledge and understanding of Target Heart Rate Range (THRR);Understanding of Exercise Prescription Increase Physical Activity;Increase Strength and Stamina;Able to understand and use rate of perceived exertion (RPE) scale;Knowledge and understanding of Target Heart Rate Range (THRR);Understanding of Exercise Prescription Increase Physical Activity;Increase Strength and Stamina;Able to understand and use rate of perceived exertion (RPE) scale;Knowledge and understanding of Target Heart Rate Range (THRR);Understanding of Exercise Prescription    Comments Pt's first day in the CRP2 program. Pt understnads the exercise Rx, RPE scale and THRR. Reviewed METs and goals. Reviewed METs and goals. Pt voices he has made good progress on his goal of increased strength and stamina. He also has achieved another goal and is back into a regaular exercise program on off days form the CRR2 program Pt graduated from the CRP2 program today. Pt had peak METs of 5.0. Pt achieved his goal of increased strength and stamina and he is exercisng regularly at home which was also a goal. Pt will be going to the gym and walking at home. Pt is currently exercising 6-7x/week.    Expected Outcomes Will continue to monitor the patient and progress exercise workloads as tolerated. Will continue to monitor the patient and progress exercise workloads as tolerated. Will continue to monitor the patient and progress exercise workloads as tolerated. Pt will continue to exericse at home/gym.              Nutrition & Weight - Outcomes:  Pre Biometrics - 05/04/23 1355       Pre Biometrics   Waist Circumference 38 inches    Hip Circumference 39 inches    Waist to Hip Ratio 0.97 %    Triceps Skinfold 9 mm    Grip Strength 32 kg    Flexibility 14 in    Single Leg Stand 6.08 seconds             Post Biometrics - 07/21/23 1230        Post  Biometrics   Height 5\' 8"  (1.727 m)    Weight 68 kg    Waist Circumference 38 inches    Hip Circumference 38 inches    Waist to Hip Ratio 1 %    BMI (Calculated) 22.8    Triceps Skinfold 8 mm    % Body Fat 21.6 %    Grip Strength 30 kg    Flexibility 15.75 in    Single Leg Stand 27.37 seconds             Nutrition:  Nutrition Therapy & Goals - 07/28/23 1328       Nutrition Therapy   Diet Heart healthy diet    Drug/Food Interactions Statins/Certain Fruits      Personal Nutrition Goals   Nutrition Goal Patient to identify strategies for reducing cardiovascular risk by attending the [employer] education and nutrition series weekly.    Personal Goal #2 Patient to improve diet quality by using the plate method as a guide for meal planning to include lean protein/plant protein, fruits, vegetables, whole grains, nonfat dairy as part of a well-balanced diet.    Comments Goals not met as patient declines nutrition education/intervention at this time. Patient's A1c is well controlled. He is down 9# since starting with our program; however, weight loss has slowed and he has maintained his weight over the last two weeks. He denies  appetite changes or changes to eating habits/diet. BMI remains stable for age. Patient will continue to benefit from adherance to a heart healthy diet plan, exercise recommendations, and lifestyle modification.      Intervention Plan   Intervention Prescribe, educate and counsel regarding individualized specific dietary modifications aiming towards targeted core components such as weight, hypertension, lipid  management, diabetes, heart failure and other comorbidities.;Nutrition handout(s) given to patient.    Expected Outcomes Short Term Goal: Understand basic principles of dietary content, such as calories, fat, sodium, cholesterol and nutrients.;Long Term Goal: Adherence to prescribed nutrition plan.             Nutrition Discharge:  Nutrition Assessments - 07/28/23 1433       Rate Your Plate Scores   Pre Score 73    Post Score 76             Education Questionnaire Score:  Knowledge Questionnaire Score - 05/04/23 1509       Knowledge Questionnaire Score   Pre Score 17/24            Frederick Knight graduated today, 07/28/2023 from the Intensive/Traditional CR program completing 32 exercise sessions. Pt elected not to attend any education sessions. Goals reviewed with patient; copy given to patient. Pt maintained good attendance and progressed nicely during his participation in rehab as evidenced by increased METs and workload. Frederick Knight increased his distance on his post exercise walk test from 1177 feet to 1670 feet, which is a 41.89% change. Initial PHQ-2/PHQ-9 score 0/1, post scores 0/1. Pt states his mental health is stable. RN reviewed his Quality of Life assessment, pre QOL score 28.91%. Post score decreased to 25.76% but scores still above 19% indicating a positive quality of life. No areas of concern. Frederick Knight declines any needs at the time of graduation.   Core Component Goals: Goal not met for weight maintenance/weight gain. Frederick Knight lost 9# since starting the program. Our dietitian attempted to work with him to gain weight, but he was reluctant to make changes. He met his goal for blood pressure control. Frederick Knight's BPs were low starting in the program, but he worked with his MD to titrate the BP med doses down. Goal met for diabetes control with A1c < 7%. On 03/17/22, Frederick Knight's A1c was 6.1%. He is compliant in taking his lipid medication, atorvastatin. Last lipid panel was WNL. For stress  relief Frederick Knight likes to visit friends and family, be outdoors, spend time with his cat, and walk in the park. Frederick Knight plans to continue exercising at the [facility]. We are proud of Frederick Knight's success in the program!

## 2023-07-26 ENCOUNTER — Encounter (HOSPITAL_COMMUNITY)
Admission: RE | Admit: 2023-07-26 | Discharge: 2023-07-26 | Disposition: A | Discharge status: Home / Self Care | Payer: Medicare Other | Source: Ambulatory Visit | Attending: Internal Medicine | Admitting: Internal Medicine

## 2023-07-26 DIAGNOSIS — I214 Non-ST elevation (NSTEMI) myocardial infarction: Secondary | ICD-10-CM

## 2023-07-26 DIAGNOSIS — Z955 Presence of coronary angioplasty implant and graft: Secondary | ICD-10-CM

## 2023-07-28 ENCOUNTER — Encounter (HOSPITAL_COMMUNITY)
Admission: RE | Admit: 2023-07-28 | Discharge: 2023-07-28 | Disposition: A | Discharge status: Home / Self Care | Payer: Medicare Other | Source: Ambulatory Visit | Attending: Internal Medicine | Admitting: Internal Medicine

## 2023-07-28 DIAGNOSIS — I214 Non-ST elevation (NSTEMI) myocardial infarction: Secondary | ICD-10-CM | POA: Diagnosis not present

## 2023-07-28 DIAGNOSIS — Z955 Presence of coronary angioplasty implant and graft: Secondary | ICD-10-CM | POA: Diagnosis not present

## 2023-08-05 ENCOUNTER — Other Ambulatory Visit: Payer: Self-pay | Admitting: Student

## 2023-08-16 ENCOUNTER — Other Ambulatory Visit: Payer: Self-pay

## 2023-08-16 DIAGNOSIS — I214 Non-ST elevation (NSTEMI) myocardial infarction: Secondary | ICD-10-CM

## 2023-08-16 NOTE — Telephone Encounter (Signed)
Calling stating that his jardiance is $500 and he can not afford this. Pt would like someone to call him back concerning this matter. Please address

## 2023-08-17 MED ORDER — EMPAGLIFLOZIN 10 MG PO TABS: 10.0000 mg | ORAL_TABLET | Freq: Every day | ORAL | 3 refills | Status: DC

## 2023-08-17 NOTE — Telephone Encounter (Signed)
**Note De-Identified Matsue Strom Obfuscation** The pt and I discussed the cost for both his Eliquis and Jardiance as both have gone up in cost. Per his request I called his number back and left him a VM message with the phone numbers to [facility] [phone]) for Jardiance assistance and [employer] [phone]) for Eliquis assistance.  I advised in the message that he should ask each foundations questions about their assistance programs and his eligibility to be approved for each and that if they advise him that he is eligible to request that they each of them mail him a patient assistance application to his home address.  I also advised that once he receives his applications to complete the patient's part of each application, obtain required documents per each foundation, and to bring all to drop off in the front office at [facility] at (Dr Trula Ore office) at  [address] in [place].  He did request that I ask Dr Lynnette Caffey if it is a must that he take Jardiance and that if so he will purchase it, otherwise is it ok for him to stop taking. Forwarding to Dr Lynnette Caffey for his recommendation.

## 2023-08-18 MED ORDER — APIXABAN 5 MG PO TABS
5.0000 mg | ORAL_TABLET | Freq: Two times a day (BID) | ORAL | Status: DC
Start: 2023-08-18 — End: 2023-12-27

## 2023-08-18 MED ORDER — EMPAGLIFLOZIN 10 MG PO TABS: 10.0000 mg | ORAL_TABLET | Freq: Every day | ORAL | Status: DC

## 2023-08-18 NOTE — Telephone Encounter (Signed)
**Note De-Identified Arisbeth Purrington Obfuscation** The pt is aware that we are leaving him a [employer] application for Eliquis assistance and a [employer] application for Jardiance assistance along with 2 weeks of Jardiance 10 mg and Eliquis 5 mg samples in the front office for him to pick up.  He is aware to complete his part of each application, obtain required documents per each foundation, and to bring all of both back to the office to drop off and that we will take care of the providers page of each application and that we will fax both the the appropriate foundation.   He verbalized understanding and thanked me for our assistance.

## 2023-08-18 NOTE — Telephone Encounter (Signed)
Leaving pt 2 weeks of samples of Eliquis 5 mg tablets and Jardiance 10 mg tablets and patient assistance applications for both, at [facility] front desk, for pt to pick up. FYI

## 2023-08-18 NOTE — Telephone Encounter (Signed)
**Note De-Identified Latonja Bobeck Obfuscation** I have advised the pt of Dr Trula Ore recommendation to continue taking Jardiance due to DM. The pt verbalized understanding and is in agreement to continue to take.  He stated that he has contacted [facility] and that they are mailing him a patient assistance application for Jardiance assistance and the he is aware to call Larita Fife at Dr Trula Ore office if he requires assistance with his application.  Also, the pt plans to call [employer] today concerning Eliquis assistance. He is aware that I will call him back before 2 pm today to f/u with him concerning what they tell him as far as his eligibility to be approved for their Eliquis assistance program and how much his 3% out of pocket for medications is for the year 2024.  He is requesting samples of both Jardiance and Eliquis.  He is aware that if he is advised by [employer] that he has met his 3% OOP amount, that we will provide him with Eliquis samples but that if he has not, he will need to pay for Eliquis to help meet his 3% requirement. He verbalized understanding to all information given and thanked me for my call.

## 2023-08-28 ENCOUNTER — Telehealth: Payer: Self-pay | Admitting: Internal Medicine

## 2023-08-28 NOTE — Telephone Encounter (Signed)
Pt's patient assistance applications for Eliquis and Jardiance were scanned to [place], [employer]. FYI

## 2023-08-28 NOTE — Telephone Encounter (Signed)
Pt dropped off medication assistance paperwork. I will leave them here at the front in your folder. Thank you!

## 2023-08-29 NOTE — Telephone Encounter (Signed)
All forms printed and signed by the DOD Dr. Elease Hashimoto.  All forms faxed to the contact information provided on each cover sheet.   Confirmation fax received on both applications.   Will make PA Nurse and Dr. Trula Ore RN aware of completion.

## 2023-08-29 NOTE — Telephone Encounter (Signed)
**Note De-Identified Laurren Lepkowski Obfuscation** I have completed the providers page of the pts [employer] application for Jardiance and the providers page of his [employer] application for Eliquis assistance and have e-mailed all of each application to Dr Trula Ore nurse and the nurse working with our DOD, Dr Elease Hashimoto so one of them can obtain his signature/date in Dr Trula Ore absence on each application and to then fax all to the appropriate pt assistance foundation at the fax number written on the cover letter included with each application.

## 2023-09-06 ENCOUNTER — Telehealth: Payer: Self-pay | Admitting: Internal Medicine

## 2023-09-06 NOTE — Telephone Encounter (Signed)
New Message:       Patient says he  needs to talk to the nurse about his application for patient assistance with his medicine.

## 2023-09-06 NOTE — Telephone Encounter (Signed)
I spoke with patient who is asking if assistance forms have been sent in.  I let him know forms were faxed on 9/17

## 2023-09-07 ENCOUNTER — Other Ambulatory Visit: Payer: Self-pay

## 2023-09-07 NOTE — Telephone Encounter (Addendum)
**Note De-Identified Autumn Pruitt Obfuscation** Letter received from [facility] stating that the pts full name was not clear on the pts application. I checked the pts application and his signature was dark on my copy.  I called [employer] and s/w Marylene Land who advised me that the matter has been resolved but that the pt has not met his 3% out of pocket for this year for his RX's, based on his households adjusted gross income.  Marylene Land states that they have notified the pt of this [place] Nadal letter. [phone]

## 2023-09-08 MED ORDER — METFORMIN HCL ER 500 MG PO TB24: 500.0000 mg | ORAL_TABLET | Freq: Every day | ORAL | 1 refills | Status: DC

## 2023-09-12 ENCOUNTER — Telehealth: Payer: Self-pay | Admitting: Internal Medicine

## 2023-09-12 NOTE — Telephone Encounter (Signed)
Patient wants to know the date, time and the fax number his patient assistance forms were sent to. He states he called the company and they are asking for this information.

## 2023-09-12 NOTE — Telephone Encounter (Signed)
Called and spoke w patient.  Provided requested info on Jardiance patient assist forms.  He will speak w them and if they need the forms faxed again the patient will call back to let me know.

## 2023-09-13 ENCOUNTER — Other Ambulatory Visit: Payer: Self-pay | Admitting: Physical Medicine and Rehabilitation

## 2023-09-13 DIAGNOSIS — R3121 Asymptomatic microscopic hematuria: Secondary | ICD-10-CM | POA: Diagnosis not present

## 2023-09-13 DIAGNOSIS — R350 Frequency of micturition: Secondary | ICD-10-CM | POA: Diagnosis not present

## 2023-09-13 NOTE — Telephone Encounter (Signed)
Refaxed Jardiance pt assistance forms at number provided.

## 2023-09-13 NOTE — Telephone Encounter (Signed)
Caller Larita Fife) stated they have not received the patient's application and wants it re-faxed to fax# [phone].

## 2023-09-18 ENCOUNTER — Telehealth: Payer: Self-pay | Admitting: Internal Medicine

## 2023-09-18 NOTE — Telephone Encounter (Signed)
Patient calling the office for samples of medication:   1.  What medication and dosage are you requesting samples for?  apixaban (ELIQUIS) 5 MG TABS tablet    empagliflozin (JARDIANCE) 10 MG TABS tablet   2.  Are you currently out of this medication?   Yes

## 2023-09-18 NOTE — Telephone Encounter (Signed)
Pt was denied pt assistance for Eliquis per prior encounter. Cannot be sampled through the end of the year. Will need to pay copay at pharmacy or pursue one of the below options:  Xarelto: Similar to Eliquis, but it is taken just once a day. The drug company offers a program called Xarelto with Me Coverage Gap Support. It runs from April 1-December 31. Patients in the coverage gap can get Xarelto for $89 (for 30 days) or $250 (for 90 days). They can call to enroll at: 888-XARELTO [phone])  Dabigatran: Generic of Pradaxa. GoodRx coupon bypasses insurance and brings cost down to ~$70/month if insurance doesn't cover it at a better rate.  Warfarin: Generic and cheap alternative, however it requires frequent in office lab monitoring. It's less effective than the above options and has more food and drug interactions. There may be a copay associated with lab monitoring appts.

## 2023-09-19 MED ORDER — APIXABAN 5 MG PO TABS: 5.0000 mg | ORAL_TABLET | Freq: Two times a day (BID) | ORAL | Status: DC

## 2023-09-19 MED ORDER — EMPAGLIFLOZIN 10 MG PO TABS: 10.0000 mg | ORAL_TABLET | Freq: Every day | ORAL | Status: DC

## 2023-09-19 NOTE — Telephone Encounter (Signed)
Patient came in to provider his patient prescription record to attach to his application to provide his meeting his 3% out of pocket.  I have faxed this to [employer] and [employer].  Will have this scanned into his chart.  He was provided with 2 weeks worth of samples while we wait to ensure he has meet his 3% out of pocket.  He was very appreciative of my help.

## 2023-09-22 ENCOUNTER — Ambulatory Visit: Payer: Medicare Other

## 2023-09-29 ENCOUNTER — Ambulatory Visit: Payer: Medicare Other

## 2023-09-29 VITALS — Ht 68.0 in | Wt 149.0 lb

## 2023-09-29 DIAGNOSIS — Z0000 Encounter for general adult medical examination without abnormal findings: Secondary | ICD-10-CM | POA: Diagnosis not present

## 2023-09-29 NOTE — Progress Notes (Signed)
Subjective:   Frederick Knight is a 72 y.o. male who presents for an Initial Medicare Annual Wellness Visit.  Visit Complete: Virtual I connected with  Frederick Knight on 09/29/23 by a audio enabled telemedicine application and verified that I am speaking with the correct person using two identifiers.  Patient Location: Home  Provider Location: Home Office  I discussed the limitations of evaluation and management by telemedicine. The patient expressed understanding and agreed to proceed.  Vital Signs: Because this visit was a virtual/telehealth visit, some criteria may be missing or patient reported. Any vitals not documented were not able to be obtained and vitals that have been documented are patient reported.  Cardiac Risk Factors include: advanced age (>19men, >54 women);diabetes mellitus;dyslipidemia;hypertension;male gender     Objective:    Today's Vitals   09/29/23 0936  Weight: 149 lb (67.6 kg)  Height: 5\' 8"  (1.727 m)   Body mass index is 22.66 kg/m.     09/29/2023    6:58 PM 03/28/2023    1:17 PM 03/27/2023   10:33 PM 12/15/2022    2:17 PM 05/05/2022   10:21 AM 02/24/2022   10:37 AM 02/03/2022    2:21 PM  Advanced Directives  Does Patient Have a Medical Advance Directive? No  No No No No No  Would patient like information on creating a medical advance directive? Yes (MAU/Ambulatory/Procedural Areas - Information given) No - Patient declined  No - Patient declined  No - Patient declined No - Patient declined    Current Medications (verified) Outpatient Encounter Medications as of 09/29/2023  Medication Sig   acetaminophen (TYLENOL) 325 MG tablet Take 2 tablets (650 mg total) by mouth every 4 (four) hours as needed for headache or mild pain.   apixaban (ELIQUIS) 5 MG TABS tablet Take 1 tablet (5 mg total) by mouth 2 (two) times daily.   apixaban (ELIQUIS) 5 MG TABS tablet Take 1 tablet (5 mg total) by mouth 2 (two) times daily.   atorvastatin (LIPITOR) 80 MG  tablet Take 1 tablet (80 mg total) by mouth daily.   clopidogrel (PLAVIX) 75 MG tablet TAKE 1 TABLET BY MOUTH EVERY DAY   empagliflozin (JARDIANCE) 10 MG TABS tablet Take 1 tablet (10 mg total) by mouth daily.   empagliflozin (JARDIANCE) 10 MG TABS tablet Take 1 tablet (10 mg total) by mouth daily before breakfast.   metFORMIN (GLUCOPHAGE-XR) 500 MG 24 hr tablet Take 1 tablet (500 mg total) by mouth daily with breakfast.   metoprolol succinate (TOPROL XL) 25 MG 24 hr tablet Take 1 tablet (25 mg total) by mouth daily.   oxybutynin (DITROPAN-XL) 5 MG 24 hr tablet Take 5 mg by mouth daily.   tamsulosin (FLOMAX) 0.4 MG CAPS capsule TAKE 1 CAPSULE BY MOUTH EVERY DAY   No facility-administered encounter medications on file as of 09/29/2023.    Allergies (verified) Patient has no known allergies.   History: Past Medical History:  Diagnosis Date   Diabetes mellitus without complication (HCC)    Hyperlipemia    Controlled well with medications   Past Surgical History:  Procedure Laterality Date   COLONOSCOPY N/A 01/15/2015   RMR:  Multiple polyps removed as described above.   COLONOSCOPY  01/28/2021   [facility]leene [facility]   COLONOSCOPY  2016   CORONARY STENT INTERVENTION N/A 03/29/2023   Procedure: CORONARY STENT INTERVENTION;  Surgeon: Swaziland, Peter M, MD;  Location: [facility] INVASIVE CV LAB;  Service: Cardiovascular;  Laterality: N/A;   CORONARY STENT  INTERVENTION N/A 03/28/2023   Procedure: CORONARY STENT INTERVENTION;  Surgeon: Lennette Bihari, MD;  Location: [facility] INVASIVE CV LAB;  Service: Cardiovascular;  Laterality: N/A;   CRANIOTOMY  05/16/2012   Procedure: CRANIOTOMY HEMATOMA EVACUATION SUBDURAL;  Surgeon: Mariam Dollar, MD;  Location: [facility] NEURO ORS;  Service: Neurosurgery;  Laterality: N/A;  Right burr holes for evacuation of subdural hematoma   CRANIOTOMY  05/18/2012   Procedure: CRANIOTOMY HEMATOMA EVACUATION SUBDURAL;  Surgeon: Mariam Dollar, MD;  Location: [facility] NEURO ORS;  Service: Neurosurgery;   Laterality: Right;   LEFT HEART CATH AND CORONARY ANGIOGRAPHY N/A 03/28/2023   Procedure: LEFT HEART CATH AND CORONARY ANGIOGRAPHY;  Surgeon: Lennette Bihari, MD;  Location: [facility] INVASIVE CV LAB;  Service: Cardiovascular;  Laterality: N/A;   Family History  Problem Relation Age of Onset   Stroke Mother    Colon cancer Neg Hx    Esophageal cancer Neg Hx    Rectal cancer Neg Hx    Stomach cancer Neg Hx    Colon polyps Neg Hx    Social History   Socioeconomic History   Marital status: Divorced    Spouse name: Not on file   Number of children: Not on file   Years of education: Not on file   Highest education level: Not on file  Occupational History   Not on file  Tobacco Use   Smoking status: Never   Smokeless tobacco: Never  Vaping Use   Vaping status: Never Used  Substance and Sexual Activity   Alcohol use: No    Alcohol/week: 0.0 standard drinks of alcohol   Drug use: No   Sexual activity: Not on file  Other Topics Concern   Not on file  Social History Narrative   Not on file   Social Determinants of Health   Financial Resource Strain: Low Risk  (09/29/2023)   Overall Financial Resource Strain (CARDIA)    Difficulty of Paying Living Expenses: Not hard at all  Food Insecurity: No Food Insecurity (09/29/2023)   Hunger Vital Sign    Worried About Running Out of Food in the Last Year: Never true    Ran Out of Food in the Last Year: Never true  Transportation Needs: No Transportation Needs (09/29/2023)   PRAPARE - Administrator, Civil Service (Medical): No    Lack of Transportation (Non-Medical): No  Physical Activity: Sufficiently Active (09/29/2023)   Exercise Vital Sign    Days of Exercise per Week: 5 days    Minutes of Exercise per Session: 30 min  Stress: No Stress Concern Present (09/29/2023)   [employer] of Occupational Health - Occupational Stress Questionnaire    Feeling of Stress : Not at all  Social Connections: Moderately Isolated  (09/29/2023)   Social Connection and Isolation Panel [NHANES]    Frequency of Communication with Friends and Family: More than three times a week    Frequency of Social Gatherings with Friends and Family: Three times a week    Attends Religious Services: 1 to 4 times per year    Active Member of Clubs or Organizations: No    Attends Banker Meetings: Never    Marital Status: Divorced    Tobacco Counseling Counseling given: Not Answered   Clinical Intake:  Pre-visit preparation completed: Yes  Pain : No/denies pain     Diabetes: No  How often do you need to have someone help you when you read instructions, pamphlets, or other written materials from  your doctor or pharmacy?: 1 - Never  Interpreter Needed?: No  Information entered by :: Kandis Fantasia LPN   Activities of Daily Living    09/29/2023   10:01 AM 03/30/2023   12:32 PM  In your present state of health, do you have any difficulty performing the following activities:  Hearing? 0   Vision? 0   Difficulty concentrating or making decisions? 0   Walking or climbing stairs? 0   Dressing or bathing? 0   Doing errands, shopping? 0 0  Preparing Food and eating ? N   Using the Toilet? N   In the past six months, have you accidently leaked urine? N   Do you have problems with loss of bowel control? N   Managing your Medications? N   Managing your Finances? N   Housekeeping or managing your Housekeeping? N     Patient Care Team: Tiffany Kocher, DO as PCP - General (Family Medicine) Orbie Pyo, MD as PCP - Cardiology (Cardiology) Jena Gauss Gerrit Friends, MD as Consulting Physician (Gastroenterology) Jannifer Hick, MD as Consulting Physician (Urology)  Indicate any recent Medical Services you may have received from other than [facility] providers in the past year (date may be approximate).     Assessment:   This is a routine wellness examination for Frederick Knight.  Hearing/Vision screen Hearing Screening -  Comments:: Denies hearing difficulties   Vision Screening - Comments:: No vision problems; will schedule routine eye exam soon     Goals Addressed             This Visit's Progress    Remain active and independent        Depression Screen    09/29/2023    6:57 PM 07/24/2023    3:03 PM 05/04/2023    3:05 PM 04/06/2023    3:02 PM 12/15/2022    2:15 PM 04/14/2022   12:39 PM 02/24/2022   10:37 AM  PHQ 2/9 Scores  PHQ - 2 Score 0 0 0 0 0 0 0  PHQ- 9 Score  1 1 4  0 0 0    Fall Risk    09/29/2023    6:58 PM 07/28/2023   12:48 PM 07/26/2023   12:42 PM 07/24/2023   12:56 PM 07/21/2023   12:47 PM  Fall Risk   Falls in the past year? 0 0 0 0 0  Number falls in past yr: 0 0 0 0 0  Injury with Fall? 0 0 0 0 0  Risk for fall due to : No Fall Risks Impaired balance/gait Impaired balance/gait Impaired balance/gait Impaired balance/gait  Follow up Falls prevention discussed;Education provided;Falls evaluation completed Falls evaluation completed Falls evaluation completed Falls evaluation completed Falls evaluation completed    MEDICARE RISK AT HOME: Medicare Risk at Home Any stairs in or around the home?: No If so, are there any without handrails?: No Home free of loose throw rugs in walkways, pet beds, electrical cords, etc?: Yes Adequate lighting in your home to reduce risk of falls?: Yes Life alert?: No Use of a cane, walker or w/c?: No Grab bars in the bathroom?: Yes Shower chair or bench in shower?: No Elevated toilet seat or a handicapped toilet?: Yes  TIMED UP AND GO:  Was the test performed? No    Cognitive Function:        09/29/2023    6:59 PM  6CIT Screen  What Year? 0 points  What month? 0 points  What time? 0 points  Count  back from 20 0 points  Months in reverse 0 points  Repeat phrase 0 points  Total Score 0 points    Immunizations Immunization History  Administered Date(s) Administered   Influenza, Quadrivalent, Recombinant, Inj, Pf 09/09/2022    Influenza,inj,Quad PF,6+ Mos 09/26/2013, 10/15/2014, 09/09/2016, 10/30/2017, 11/17/2018   Influenza-Unspecified 11/17/2018, 09/06/2020, 09/11/2021, 09/09/2023   PFIZER(Purple Top)SARS-COV-2 Vaccination 01/24/2020, 02/18/2020, 10/09/2020   Pfizer Covid-19 Vaccine Bivalent Booster 5y-11y 11/29/2021   Pfizer(Comirnaty)Fall Seasonal Vaccine 12 years and older 10/19/2022   Pneumococcal Conjugate-13 10/05/2016   Pneumococcal Polysaccharide-23 10/30/2017   Tdap 03/31/2012   Zoster Recombinant(Shingrix) 07/06/2022, 09/09/2022    TDAP status: Due, Education has been provided regarding the importance of this vaccine. Advised may receive this vaccine at local pharmacy or Health Dept. Aware to provide a copy of the vaccination record if obtained from local pharmacy or Health Dept. Verbalized acceptance and understanding.  Flu Vaccine status: Up to date  Pneumococcal vaccine status: Up to date  Covid-19 vaccine status: Information provided on how to obtain vaccines.   Qualifies for Shingles Vaccine? Yes   Zostavax completed No   Shingrix Completed?: Yes  Screening Tests Health Maintenance  Topic Date Due   FOOT EXAM  Never done   OPHTHALMOLOGY EXAM  Never done   Diabetic kidney evaluation - Urine ACR  Never done   DTaP/Tdap/Td (2 - Td or Tdap) 03/31/2022   Colonoscopy  01/28/2023   COVID-19 Vaccine (6 - 2023-24 season) 08/13/2023   HEMOGLOBIN A1C  09/27/2023   Diabetic kidney evaluation - eGFR measurement  04/05/2024   Medicare Annual Wellness (AWV)  09/28/2024   Pneumonia Vaccine 54+ Years old  Completed   INFLUENZA VACCINE  Completed   Hepatitis C Screening  Completed   Zoster Vaccines- Shingrix  Completed   HPV VACCINES  Aged Out    Health Maintenance  Health Maintenance Due  Topic Date Due   FOOT EXAM  Never done   OPHTHALMOLOGY EXAM  Never done   Diabetic kidney evaluation - Urine ACR  Never done   DTaP/Tdap/Td (2 - Td or Tdap) 03/31/2022   Colonoscopy  01/28/2023    COVID-19 Vaccine (6 - 2023-24 season) 08/13/2023   HEMOGLOBIN A1C  09/27/2023    Colorectal cancer screening:  Patient declines at this time   Lung Cancer Screening: (Low Dose CT Chest recommended if Age 5-80 years, 20 pack-year currently smoking OR have quit w/in 15years.) does not qualify.   Lung Cancer Screening Referral: n/a  Additional Screening:  Hepatitis C Screening: does qualify; Completed 03/29/23  Vision Screening: Recommended annual ophthalmology exams for early detection of glaucoma and other disorders of the eye. Is the patient up to date with their annual eye exam?  No  Who is the provider or what is the name of the office in which the patient attends annual eye exams? none If pt is not established with a provider, would they like to be referred to a provider to establish care? No .   Dental Screening: Recommended annual dental exams for proper oral hygiene  Diabetic Foot Exam: Diabetic Foot Exam: Overdue, Pt has been advised about the importance in completing this exam. Pt is scheduled for diabetic foot exam on at next office visit.  Community Resource Referral / Chronic Care Management: CRR required this visit?  No   CCM required this visit?  No    Plan:     I have personally reviewed and noted the following in the patient's chart:   Medical and social  history Use of alcohol, tobacco or illicit drugs  Current medications and supplements including opioid prescriptions. Patient is not currently taking opioid prescriptions. Functional ability and status Nutritional status Physical activity Advanced directives List of other physicians Hospitalizations, surgeries, and ER visits in previous 12 months Vitals Screenings to include cognitive, depression, and falls Referrals and appointments  In addition, I have reviewed and discussed with patient certain preventive protocols, quality metrics, and best practice recommendations. A written personalized care plan  for preventive services as well as general preventive health recommendations were provided to patient.     Kandis Fantasia [place], [place]   [id number]/09/[id number]   After Visit Summary: (MyChart) Due to this being a telephonic visit, the after visit summary with patients personalized plan was offered to patient via MyChart   Nurse Notes: Patient is coming in for appointment and would like to know if labs needed from urology could be ordered by pcp.  I can find no notes where he saw [facility] but patient states that he was recently seen by Dr. Jettie Pagan

## 2023-09-29 NOTE — Patient Instructions (Signed)
Mr. Frederick Knight , Thank you for taking time to come for your Medicare Wellness Visit. I appreciate your ongoing commitment to your health goals. Please review the following plan we discussed and let me know if I can assist you in the future.   Referrals/Orders/Follow-Ups/Clinician Recommendations: Aim for 30 minutes of exercise or brisk walking, 6-8 glasses of water, and 5 servings of fruits and vegetables each day.  This is a list of the screening recommended for you and due dates:  Health Maintenance  Topic Date Due   Complete foot exam   Never done   Eye exam for diabetics  Never done   Yearly kidney health urinalysis for diabetes  Never done   DTaP/Tdap/Td vaccine (2 - Td or Tdap) 03/31/2022   Colon Cancer Screening  01/28/2023   COVID-19 Vaccine (6 - 2023-24 season) 08/13/2023   Hemoglobin A1C  09/27/2023   Yearly kidney function blood test for diabetes  04/05/2024   Medicare Annual Wellness Visit  09/28/2024   Pneumonia Vaccine  Completed   Flu Shot  Completed   Hepatitis C Screening  Completed   Zoster (Shingles) Vaccine  Completed   HPV Vaccine  Aged Out    Advanced directives: (ACP Link)Information on Advanced Care Planning can be found at [facility] of [place] Advance Health Care Directives Advance Health Care Directives ([email or web address]   Next Medicare Annual Wellness Visit scheduled for next year: Yes

## 2023-10-06 ENCOUNTER — Encounter: Payer: Self-pay | Admitting: Student

## 2023-10-06 ENCOUNTER — Ambulatory Visit (INDEPENDENT_AMBULATORY_CARE_PROVIDER_SITE_OTHER): Payer: Medicare Other | Admitting: Student

## 2023-10-06 VITALS — BP 131/77 | HR 69

## 2023-10-06 DIAGNOSIS — R300 Dysuria: Secondary | ICD-10-CM | POA: Diagnosis not present

## 2023-10-06 DIAGNOSIS — E119 Type 2 diabetes mellitus without complications: Secondary | ICD-10-CM

## 2023-10-06 DIAGNOSIS — R7303 Prediabetes: Secondary | ICD-10-CM

## 2023-10-06 LAB — POCT URINALYSIS DIP (MANUAL ENTRY)
Bilirubin, UA: NEGATIVE
Glucose, UA: >=1000 mg/dL — AB
Ketones, POC UA: NEGATIVE mg/dL
Nitrite, UA: NEGATIVE
Protein Ur, POC: NEGATIVE mg/dL
Spec Grav, UA: 1.02 (ref 1.010–1.025)
Urobilinogen, UA: 1 U/dL
pH, UA: 5.5 (ref 5.0–8.0)

## 2023-10-06 LAB — POCT UA - MICROSCOPIC ONLY

## 2023-10-06 LAB — POCT GLYCOSYLATED HEMOGLOBIN (HGB A1C): HbA1c, POC (controlled diabetic range): 5.7 % (ref 0.0–7.0)

## 2023-10-06 MED ORDER — CEPHALEXIN 500 MG PO CAPS
500.0000 mg | ORAL_CAPSULE | Freq: Two times a day (BID) | ORAL | 0 refills | Status: AC
Start: 2023-10-06 — End: 2023-10-13

## 2023-10-06 NOTE — Assessment & Plan Note (Signed)
A1c of 5.7, in goal.  He is interested in discontinuing metformin, we will discuss at next visit. - Follow-up in 2 weeks - UACR

## 2023-10-06 NOTE — Progress Notes (Signed)
    SUBJECTIVE:   CHIEF COMPLAINT / HPI:    Dysuria microscopic hematuria suprapubic pain Patient presents with dysuria.  He was recently seen October by [facility] urology.  They had found microscopic hematuria, he was asymptomatic that time.  He is undergoing hematuria workup including CT scan in November, with possible cystoscopy in December.  He is not currently on treatment other than tamsulosin for BPH.  He is up-to-date with routine lab work.  Dysuria started 2 weeks ago, burning sensation when urinating.  Denies increased frequency from his normal.  Suprapubic pain is worse in the morning after waking up.  No flank pain.  No other systemic symptoms.  Questioning for other causes of lower abdominal pain negative including: Bulges, rashes, pain after eating, nausea vomiting diarrhea, bloody stools.  Additionally he has colonoscopy coming up at the end of this year.  T2DM Compliant with metformin and Jardiance.  He is interested in coming off of metformin.  Would like A1c checked today.  PERTINENT  PMH / PSH: Ischemic cardiomyopathy, GERD, type 2 diabetes, BPH, urge incontinence  OBJECTIVE:   BP 131/77   Pulse 69   SpO2 98%    General: NAD, pleasant Cardio: RRR, no MRG. Respiratory: CTAB, normal wob on RA GI: Abdomen is soft, not distended. No masses, no hernias. BS present. Tenderness over suprapubic region. Skin: Warm and dry, no rashes   ASSESSMENT/PLAN:   Assessment & Plan Dysuria Dysuria with suprapubic pain.  UA positive for microscopic hematuria as well as trace leukocytes.  Given symptoms, will treat as UTI. Low concern for obstructing stone.  He is already undergoing workup for hematuria. - Keflex 5 mg twice daily, 7 days - Follow-up urine culture - CT abdomen pelvis in November - Follow-up with urology - Follow-up in 2-4 weeks if symptoms are not improving Type 2 diabetes mellitus without complication, without long-term current use of insulin (HCC) A1c of  5.7, in goal.  He is interested in discontinuing metformin, we will discuss at next visit. - Follow-up in 2 weeks - UACR  Tiffany Kocher, DO [facility]

## 2023-10-06 NOTE — Assessment & Plan Note (Addendum)
Dysuria with suprapubic pain.  UA positive for microscopic hematuria as well as trace leukocytes.  Given symptoms, will treat as UTI. Low concern for obstructing stone.  He is already undergoing workup for hematuria. - Keflex 5 mg twice daily, 7 days - Follow-up urine culture - CT abdomen pelvis in November - Follow-up with urology - Follow-up in 2-4 weeks if symptoms are not improving

## 2023-10-06 NOTE — Patient Instructions (Signed)
It was great to see you! Thank you for allowing me to participate in your care!   I recommend that you always bring your medications to each appointment as this makes it easy to ensure we are on the correct medications and helps [place] not miss when refills are needed.  Our plans for today:  - Continue taking Tamsulosin - Please go to your CT scan, and urology follow-up - Take 500 mg of Keflex twice a day for 7 days to treat urinary tract infection  We are checking some labs today, I will call you if they are abnormal will send you a MyChart message or a letter if they are normal.  If you do not hear about your labs in the next 2 weeks please let us know.  Take care and seek immediate care sooner if you develop any concerns. Please remember to show up 15 minutes before your scheduled appointment time!  Tiffany Kocher, DO [facility]

## 2023-10-07 LAB — MICROALBUMIN / CREATININE URINE RATIO
Creatinine, Urine: 124.7 mg/dL
Microalb/Creat Ratio: 6 mg/g{creat} (ref 0–29)
Microalbumin, Urine: 7.5 ug/mL

## 2023-10-08 LAB — URINE CULTURE

## 2023-10-18 ENCOUNTER — Other Ambulatory Visit: Payer: Self-pay | Admitting: Physician Assistant

## 2023-10-23 ENCOUNTER — Ambulatory Visit: Payer: Medicare Other | Admitting: Student

## 2023-10-23 DIAGNOSIS — R3121 Asymptomatic microscopic hematuria: Secondary | ICD-10-CM | POA: Diagnosis not present

## 2023-10-23 DIAGNOSIS — R3129 Other microscopic hematuria: Secondary | ICD-10-CM | POA: Diagnosis not present

## 2023-10-23 DIAGNOSIS — N281 Cyst of kidney, acquired: Secondary | ICD-10-CM | POA: Diagnosis not present

## 2023-10-25 ENCOUNTER — Ambulatory Visit: Payer: Medicare Other | Admitting: Student

## 2023-11-03 ENCOUNTER — Ambulatory Visit (INDEPENDENT_AMBULATORY_CARE_PROVIDER_SITE_OTHER): Payer: Medicare Other | Admitting: Student

## 2023-11-03 ENCOUNTER — Encounter: Payer: Self-pay | Admitting: Student

## 2023-11-03 VITALS — BP 118/64 | HR 65 | Wt 143.0 lb

## 2023-11-03 DIAGNOSIS — R300 Dysuria: Secondary | ICD-10-CM | POA: Diagnosis not present

## 2023-11-03 NOTE — Patient Instructions (Signed)
It was great to see you! Thank you for allowing me to participate in your care!   I recommend that you always bring your medications to each appointment as this makes it easy to ensure we are on the correct medications and helps [place] not miss when refills are needed.  Our plans for today:  - Follow-up in January to discuss painful urination and other medical conditions  Take care and seek immediate care sooner if you develop any concerns. Please remember to show up 15 minutes before your scheduled appointment time!  Tiffany Kocher, DO [facility]

## 2023-11-03 NOTE — Progress Notes (Deleted)
    SUBJECTIVE:   CHIEF COMPLAINT / HPI:   ***  PERTINENT  PMH / PSH: ***  OBJECTIVE:   BP 118/64   Pulse 65   Wt 143 lb (64.9 kg)   SpO2 98%   BMI 21.74 kg/m   ***  ASSESSMENT/PLAN:   No problem-specific Assessment & Plan notes found for this encounter.     Tiffany Kocher, DO [facility]

## 2023-11-03 NOTE — Assessment & Plan Note (Signed)
Currently resolved.  Undergoing workup for hematuria diarrhea with urology, his next appointment is December-they will likely do a cystoscopy.  With regard to dysuria, UTI was considered particularly with the use of Jardiance-however culture was negative and do not suspect this is UTI.  Prostatitis remains in differential.  Due to transient nature of dysuria with hematuria, may also have been urolithiasis.  Dysuria may also be 2/2 urethral irritation from hematuria. Unable to obtain UA today, as patient is taking AZO pills. - Discontinue AZO pills - Follow-up with urology next month - Follow-up with PCP in January

## 2023-11-03 NOTE — Progress Notes (Signed)
    SUBJECTIVE:   CHIEF COMPLAINT / HPI:   Dysuria Patient presents for follow-up of his dysuria.  He is currently being worked up for microscopic hematuria with urology.  He recently had a CT scan, however does not have the results of this.  I also unable to see results of this CT scan.  He was treated for urinary tract infection by our office, urinary culture came back negative for infection.  He does feel that his pain improved with antibiotic, however pain returned temporarily for couple days.  For the past 5 days he has not had any pain, however last 3 days started Azo pills.  Discussed discontinuing Azo pills today.  He states that he is no longer seeing blood in his urine.  OBJECTIVE:   BP 118/64   Pulse 65   Wt 143 lb (64.9 kg)   SpO2 98%   BMI 21.74 kg/m    General: NAD, pleasant Cardio: RRR, no MRG. Respiratory: CTAB, normal wob on RA GI: Abdomen is soft, not tender, not distended. BS present Skin: Warm and dry  ASSESSMENT/PLAN:   Assessment & Plan Dysuria Currently resolved.  Undergoing workup for hematuria diarrhea with urology, his next appointment is December-they will likely do a cystoscopy.  With regard to dysuria, UTI was considered particularly with the use of Jardiance-however culture was negative and do not suspect this is UTI.  Prostatitis remains in differential.  Due to transient nature of dysuria with hematuria, may also have been urolithiasis.  Dysuria may also be 2/2 urethral irritation from hematuria. Unable to obtain UA today, as patient is taking AZO pills. - Discontinue AZO pills - Follow-up with urology next month - Follow-up with PCP in January  Tiffany Kocher, DO [facility]

## 2023-11-09 ENCOUNTER — Other Ambulatory Visit: Payer: Self-pay | Admitting: Student

## 2023-11-13 ENCOUNTER — Telehealth: Payer: Self-pay | Admitting: Physician Assistant

## 2023-11-13 NOTE — Telephone Encounter (Signed)
Patient has dropped off prescription assistance paperwork, in two large yellow envelopes.  I am placing these envelopes in [employer] box.  Thank you.

## 2023-11-17 ENCOUNTER — Telehealth: Payer: Self-pay | Admitting: Internal Medicine

## 2023-11-17 NOTE — Telephone Encounter (Signed)
  Patient is calling following up on his patient assistance paperwork. Please let patient know.

## 2023-11-20 NOTE — Telephone Encounter (Signed)
You're welcome to fax them over to [phone]. We work for [employer] [address] office for patient assistance, but we are not onsite. You could index them to patient's chart as well and I can access them there, whichever is easier for you.

## 2023-11-21 NOTE — Telephone Encounter (Signed)
Ok. I have given the forms to the lady that handles [employer] and she will index the forms to the chart and send you a notification.

## 2023-11-21 NOTE — Telephone Encounter (Signed)
Sounds good, thank you 

## 2023-11-22 NOTE — Telephone Encounter (Signed)
ERROR

## 2023-11-23 NOTE — Telephone Encounter (Signed)
Left voicemail to return call to office.

## 2023-11-23 NOTE — Telephone Encounter (Signed)
Patient wants a call back to follow-up on the status of his application for medication.  Patient wants a copy of the completed application and wants to stop by the office to collect the copy.

## 2023-11-24 NOTE — Telephone Encounter (Signed)
Provider page completed and signed. Faxed to [phone].                     -------Fax Transmission Report-------  To:               Recipient at [phone] Subject:          Fw: Send data from [id number] 11/24/2023 16:12 Result:           The transmission was successful. Explanation:      All Pages Ok Pages Sent:       8 Connect Time:     6 minutes, 55 seconds Transmit Time:    11/24/2023 15:46 Transfer Rate:    14400 Status Code:      0000 Retry Count:      0 Job Id:           [id number] Unique Id:        [id number] Fax Line:         85 Fax Server:       [id number]

## 2023-11-24 NOTE — Telephone Encounter (Signed)
Patient is returning call. Please advise? 

## 2023-11-24 NOTE — Telephone Encounter (Signed)
Paperwork scanned into chart media does not have provider signature on them so I am unable to fax them to the company until this step has been completed. Please have provider sign the provider form on both applications and make [place] aware when they have been faxed back to [phone] or once they have been uploaded to patient chart media, whichever is easiest.

## 2023-11-24 NOTE — Telephone Encounter (Signed)
Pt aware copies of  completed paperwork will be left at the front desk.  Pt then started asking about if Rx was sent in with paperwork, etc.  Aware I will forward to medication assistance team to call him and answer these questions.

## 2023-11-27 ENCOUNTER — Telehealth: Payer: Self-pay

## 2023-11-27 NOTE — Telephone Encounter (Signed)
No worries! I really appreciate you taking the time to fax them both over. Thanks for all of your help!

## 2023-11-27 NOTE — Telephone Encounter (Signed)
Received completed application 11/27/23.  PAP: Application for ELIQUIS has been submitted to PAP Companies: [employer], via fax.For further updates, please refer patient to contact [employer] ([employer]) at [phone]. For copy of application, please refer to chart media.

## 2023-11-27 NOTE — Telephone Encounter (Signed)
Received application with signature for Jardiance, but still needing his signed Eliquis application faxed to [phone].

## 2023-11-27 NOTE — Telephone Encounter (Addendum)
Application received is incomplete (for copy of application, please refer to chart media). Missing provider signature. Clinic staff made aware.

## 2023-11-27 NOTE — Telephone Encounter (Signed)
Patient assistance paperwork for Eliquis signed by provider Jari Favre, PA-C and faxed to Med Assist Team.                   -------Fax Transmission Report-------  To:               Recipient at [phone] Subject:          Fw: Send data from [id number] 11/27/2023 11:18 Result:           The transmission was successful. Explanation:      All Pages Ok Pages Sent:       9 Connect Time:     9 minutes, 5 seconds Transmit Time:    11/27/2023 11:46 Transfer Rate:    14400 Status Code:      0000 Retry Count:      0 Job Id:           [id number] Unique Id:        [id number] Fax Line:         13 Fax Server:       [employer]

## 2023-11-27 NOTE — Telephone Encounter (Addendum)
Received completed application. PAP: Application for [place] has been submitted to PAP Companies: [employer], via fax. If patient requests update in the meantime, please refer them to contact [employer] ([employer]) at [phone].  Please note* [employer] is still very behind on processing applications, expect a 2-3 week waiting period, possibly longer. For copy of application, please refer to chart media.

## 2023-12-07 NOTE — Telephone Encounter (Signed)
Patient states that [facility] [employer] is needing page 5 section 8 needs dosage strength and directions added for the medication. They state this was missed. Requesting this be sent as soon as possible to process application.   Fax:  [phone]

## 2023-12-07 NOTE — Telephone Encounter (Signed)
Fax could be washed out on their end. Resent fax to PAP company.

## 2023-12-14 NOTE — Telephone Encounter (Signed)
 PAP: Patient assistance application for Jardiance  has been approved by PAP Companies: [employer] from 12/11/23 to 12/10/24. Medication should be delivered to PAP Delivery: Home For further shipping updates, please contact [employer] ([employer]) at [phone] Pt ID is: [id number]

## 2023-12-15 NOTE — Telephone Encounter (Signed)
 PAP: Patient has been denied for pt assistance by PAP Companies: [employer] due to 3% OUT OF POCKET PRESCRIPTION EXPENSES BASED ON HOUSEHOLD INCOME NOT MET, LETTER HAS BEEN SENT TO PATIENT.

## 2023-12-26 ENCOUNTER — Telehealth: Payer: Self-pay | Admitting: Internal Medicine

## 2023-12-26 DIAGNOSIS — I214 Non-ST elevation (NSTEMI) myocardial infarction: Secondary | ICD-10-CM

## 2023-12-26 NOTE — Telephone Encounter (Signed)
*  STAT* If patient is at the pharmacy, call can be transferred to refill team.   1. Which medications need to be refilled? (please list name of each medication and dose if known)   apixaban  (ELIQUIS ) 5 MG TABS tablet    Take 1 tablet (5 mg total) by mouth 2 (two) times daily.     2. Would you like to learn more about the convenience, safety, & potential cost savings by using the [facility]? No   3. Are you open to using the [facility] No   4. Which pharmacy/location (including street and city if local pharmacy) is medication to be sent to?[id number] - [place],  - [address]    5. Do they need a 30 day or 90 day supply? 90 Day Supply

## 2023-12-27 MED ORDER — APIXABAN 5 MG PO TABS: 5.0000 mg | ORAL_TABLET | Freq: Two times a day (BID) | ORAL | 1 refills | Status: DC

## 2023-12-27 NOTE — Telephone Encounter (Signed)
 Prescription refill request for Eliquis  received. Indication: NSTEMI with PCI  Last office visit: 07/14/23 Frederick Knight)  Scr: 1.00 (04/06/23)  Age: 73 Weight: 64.9kg  Appropriate dose. Refill sent.

## 2024-01-02 ENCOUNTER — Ambulatory Visit: Payer: Medicare Other | Admitting: Student

## 2024-01-02 ENCOUNTER — Encounter: Payer: Self-pay | Admitting: Student

## 2024-01-02 VITALS — BP 116/66 | HR 93 | Temp 97.7°F | Wt 146.0 lb

## 2024-01-02 DIAGNOSIS — R300 Dysuria: Secondary | ICD-10-CM

## 2024-01-02 DIAGNOSIS — L03811 Cellulitis of head [any part, except face]: Secondary | ICD-10-CM | POA: Diagnosis not present

## 2024-01-02 MED ORDER — DOXYCYCLINE HYCLATE 100 MG PO TABS: 100.0000 mg | ORAL_TABLET | Freq: Two times a day (BID) | ORAL | 0 refills | Status: AC

## 2024-01-02 NOTE — Patient Instructions (Addendum)
It was great to see you! Thank you for allowing me to participate in your care!   I recommend that you always bring your medications to each appointment as this makes it easy to ensure we are on the correct medications and helps [place] not miss when refills are needed.  Our plans for today:  - Please take Doxycycline 100 mg twice daily for 7 days - Please keep area clean. You may cover with a non-stick bandage to keep clean. - Please call [phone] and ask for an appointment to see Dr. Wynetta Emery about the plate in your head to ensure he doesn't have concerns for more advanced infection. - Follow-up in 1 week  Take care and seek immediate care sooner if you develop any concerns. Please remember to show up 15 minutes before your scheduled appointment time!  Tiffany Kocher, DO [facility]

## 2024-01-02 NOTE — Assessment & Plan Note (Signed)
Pending urologic evaluation.  If not improving, and no urologic intervention provided-would consider treating for proctitis given symptoms and duration.  Could also consider increasing his Flomax. - Follow-up with urology

## 2024-01-02 NOTE — Progress Notes (Signed)
    SUBJECTIVE:   CHIEF COMPLAINT / HPI:   Skin infection on scalp Patient reports that he bumped his head on something a couple of months ago, and since then has started to have increased pain around the site of his prior craniotomy.  Occasionally it will ooze and is painful to touch.  He has been using OTC antibiotic ointment without improvement.  No bleeding.  No headaches, chills, fever, NVD or other systemic symptoms.  Dysuria Patient has appointment with urology on Thursday.  He is still having dysuria, hesitancy and frequency. He is taking his Flomax 0.5 mg daily.  Discussed urology was considering cystoscopy with him, and they will need to follow-up with him on Thursday about this.  He has been treated for UTI in the past without improvement, and Uas + culture are usually unremarkable.  Using shared decision making, patient will follow-up with me after his urology appointment.  OBJECTIVE:   BP 116/66   Pulse 93   Wt 146 lb (66.2 kg)   SpO2 100%   BMI 22.20 kg/m    General: NAD, pleasant Cardio: RRR, no MRG. Cap Refill <2s. Respiratory: CTAB, normal wob on RA Skin: Lesion on scalp is tender, no drainage, mild warmth, no fluctuance.  See picture below. Neuro: Alert and oriented x4, normal gait    ASSESSMENT/PLAN:   Assessment & Plan Cellulitis of head except face Afebrile, well-appearing, no systemic symptoms.  Low concern for systemic infection.  See image above.  Suspect superficial skin infection, without abscess.  Given history of craniotomy and metal plate, will cover for MRSA out of precaution. - Doxycycline 100 mg twice daily - Wound care discussed - ED precautions discussed - Recommend follow-up with neurosurgeon to ensure there is no concern of infection near metal or need for revision.  Number provided patient will call, can send referral if needed. - Follow-up in 1 week to ensure this is improving - Although I suspect the ridges are scar tissue, given the  ulcerations if patient does not have improvement with antibiotics he may need a biopsy for malignancy. Dysuria Pending urologic evaluation.  If not improving, and no urologic intervention provided-would consider treating for proctitis given symptoms and duration.  Could also consider increasing his Flomax. - Follow-up with urology   Tiffany Kocher, DO [facility]

## 2024-01-04 DIAGNOSIS — R3121 Asymptomatic microscopic hematuria: Secondary | ICD-10-CM | POA: Diagnosis not present

## 2024-01-04 DIAGNOSIS — R350 Frequency of micturition: Secondary | ICD-10-CM | POA: Diagnosis not present

## 2024-01-11 ENCOUNTER — Ambulatory Visit: Payer: Medicare Other | Admitting: Student

## 2024-01-18 ENCOUNTER — Telehealth: Payer: Self-pay

## 2024-01-18 ENCOUNTER — Other Ambulatory Visit: Payer: Self-pay | Admitting: Student

## 2024-01-18 DIAGNOSIS — L03811 Cellulitis of head [any part, except face]: Secondary | ICD-10-CM

## 2024-01-18 DIAGNOSIS — Z9889 Other specified postprocedural states: Secondary | ICD-10-CM

## 2024-01-18 NOTE — Telephone Encounter (Signed)
 Patient calls nurse line regarding follow up from visit on 01/02/24.  He reports that he called Dr. Bartholomew Light office regarding follow up, however, they will need referral.   Forwarding to PCP for referral placement.   Elsie Halo, RN

## 2024-01-18 NOTE — Telephone Encounter (Signed)
 Referral placed.

## 2024-01-18 NOTE — Progress Notes (Signed)
 Patient previously seen by Dr. Lamon Pillow (neurosurgery).  New referral required.  Referral sent for evaluation of cellulitis near prior craniotomy site.

## 2024-01-19 ENCOUNTER — Encounter: Payer: Self-pay | Admitting: Physician Assistant

## 2024-01-19 ENCOUNTER — Ambulatory Visit: Payer: Medicare Other | Attending: Physician Assistant | Admitting: Physician Assistant

## 2024-01-19 VITALS — BP 130/70 | HR 62 | Resp 16 | Ht 68.0 in | Wt 149.0 lb

## 2024-01-19 DIAGNOSIS — I214 Non-ST elevation (NSTEMI) myocardial infarction: Secondary | ICD-10-CM | POA: Diagnosis not present

## 2024-01-19 DIAGNOSIS — I255 Ischemic cardiomyopathy: Secondary | ICD-10-CM | POA: Diagnosis not present

## 2024-01-19 DIAGNOSIS — E785 Hyperlipidemia, unspecified: Secondary | ICD-10-CM

## 2024-01-19 MED ORDER — EMPAGLIFLOZIN 10 MG PO TABS: 10.0000 mg | ORAL_TABLET | Freq: Every day | ORAL | 3 refills | Status: DC

## 2024-01-19 MED ORDER — APIXABAN 5 MG PO TABS: 5.0000 mg | ORAL_TABLET | Freq: Two times a day (BID) | ORAL | 3 refills | Status: DC

## 2024-01-19 NOTE — Patient Instructions (Addendum)
 Medication Instructions:  Your physician recommends that you continue on your current medications as directed. Please refer to the Current Medication list given to you today.  *If you need a refill on your cardiac medications before your next appointment, please call your pharmacy*  Lab Work: None ordered.  If you have labs (blood work) drawn today and your tests are completely normal, you will receive your results only by: MyChart Message (if you have MyChart) OR A paper copy in the mail If you have any lab test that is abnormal or we need to change your treatment, we will call you to review the results.  Testing/Procedures: Your physician has requested that you have an echocardiogram. Echocardiography is a painless test that uses sound waves to create images of your heart. It provides your doctor with information about the size and shape of your heart and how well your heart's chambers and valves are working. This procedure takes approximately one hour. There are no restrictions for this procedure. Please do NOT wear cologne, perfume, aftershave, or lotions (deodorant is allowed). Please arrive 15 minutes prior to your appointment time.  Please note: We ask at that you not bring children with you during ultrasound (echo/ vascular) testing. Due to room size and safety concerns, children are not allowed in the ultrasound rooms during exams. Our front office staff cannot provide observation of children in our lobby area while testing is being conducted. An adult accompanying a patient to their appointment will only be allowed in the ultrasound room at the discretion of the ultrasound technician under special circumstances. We apologize for any inconvenience.   Follow-Up: At [facility], you and your health needs are our priority.  As part of our continuing mission to provide you with exceptional heart care, we have created designated Provider Care Teams.  These Care Teams include your primary  Cardiologist (physician) and Advanced Practice Providers (APPs -  Physician Assistants and Nurse Practitioners) who all work together to provide you with the care you need, when you need it.   Your next appointment:   6 months  The format for your next appointment:   In Person  Provider:   Dr Wendel  Important Information About Sugar

## 2024-01-19 NOTE — Progress Notes (Signed)
 Office Visit    Patient Name: Frederick Knight Date of Encounter: 01/19/2024  PCP:  Howell Lunger, DO   [facility]  Cardiologist:  Arun K Thukkani, MD  Advanced Practice Provider:  No care team member to display Electrophysiologist:  None   HPI    Frederick Knight is a 73 y.o. male with a past medical history of type 2 diabetes mellitus, hyperlipidemia, GERD, BPH presents today for hospital follow-up.  Patient presented to the ED with acute onset of chest pain.  Hemodynamically stable on admission.  In the ED, labs significant for troponin 2304 >>> 5049, which trended as high as 17,821.  Started on heparin , nitro, aspirin  and high intensity statin day of admission.  LHC on 4/16 showed 100% stenosis of mid LAD which was stented, 85% stenosis of RCA +80% stenosis of the diagonal artery.  Underwent second [facility] on 4/17 with stent placed to RCA.  Cardiology had recommended medical management of the diagonal lesion.  Antiplatelet and anticoagulation plan was aspirin  plus Plavix  plus Eliquis  x 1 month then Plavix  and Eliquis  alone until anterior akinesis improves.  If AKI improves, add back aspirin  and complete DAPT for 1 year.  After 1 year of DAPT transition to Plavix  monotherapy indefinitely.  Lipid panel at goal and continue atorvastatin  80 mg at discharge.  Chest pain resolved patient was discharged 03/30/2023 in stable condition.  Patient also had a diagnosis of ischemic cardiomyopathy with LVEF 45 to 50%, LV mild decreased function with regional wall motion abnormalities and diastolic parameters consistent with grade 1 DD.  Started on metoprolol  succinate, Farxiga , and losartan  for GDMT.  He was last seen 4/24, he tells me that he is doing well with his new medications.  Tolerating Eliquis  5 mg twice a day as well as Farxiga  and metoprolol .  He is worried that his blood pressure has been low normal.  We discussed decreasing his losartan .  Otherwise plan for follow-up  echocardiogram.  Some left arm pain but he thinks it could be musculoskeletal since he lays on that side at night.  We encourage participation in cardiac rehab and sent a referral today.  He saw me 07/14/23, he is doing well. His BP is low normal today. He has some dizziness in the mornings and have been getting some low Bps at home.  Otherwise, doing okay.  He has 3 granddaughters that he enjoys spending time with.  Tolerating Plavix  and Eliquis , off aspirin .  We discussed supplements that are okay to take with his heart medications.  Advised not to take vitamin A since it is a blood thinner.  We asked him to keep track of his blood pressure with this new change for the next couple of weeks.  Reports no shortness of breath nor dyspnea on exertion. Reports no chest pain, pressure, or tightness. No edema, orthopnea, PND. Reports no palpitations.   Today, he presents with a history of hypertension and hyperlipidemia for a routine follow-up. He reports no issues with his current medications. He was previously on losartan  for hypertension, but it was discontinued due to concerns of hypotension. Since discontinuation, he reports feeling better and his blood pressure readings have been within normal limits. He is currently on metoprolol  25mg  daily, Lipitor 80mg  daily, Eliquis  twice daily, and Jardiance . He reports no issues with swelling or shortness of breath and maintains an active lifestyle, going for walks ten times a day. 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  Reports no shortness of breath nor dyspnea on exertion. Reports no chest pain, pressure, or tightness. No edema, orthopnea, PND. Reports no palpitations.   Discussed the use of AI scribe software for clinical note transcription with the patient, who gave verbal consent to proceed.  Past Medical History    Past Medical History:  Diagnosis Date   Diabetes mellitus  without complication (HCC)    Hyperlipemia    Controlled well with medications   Past Surgical History:  Procedure Laterality Date   COLONOSCOPY N/A 01/15/2015   RMR:  Multiple polyps removed as described above.   COLONOS[facility]  01/28/2021   [facility]   COLONOSCOPY  2016   CORONARY STENT INTERVENTION N/A 03/29/2023   Procedure: CORONARY STENT INTERVENTION;  Surgeon: Jordan, Peter M, MD;  Location: [facility] INVASIVE CV LAB;  Service: Cardiovascular;  Laterality: N/A;   CORONARY STENT INTERVENTION N/A 03/28/2023   Procedure: CORONARY STENT INTERVENTION;  Surgeon: Burnard Debby LABOR, MD;  Location: [facility] INVASIVE CV LAB;  Service: Cardiovascular;  Laterality: N/A;   CRANIOTOMY  05/16/2012   Procedure: CRANIOTOMY HEMATOMA EVACUATION SUBDURAL;  Surgeon: Arley SHAUNNA Helling, MD;  Location: [facility] NEURO ORS;  Service: Neurosurgery;  Laterality: N/A;  Right burr holes for evacuation of subdural hematoma   CRANIOTOMY  05/18/2012   Procedure: CRANIOTOMY HEMATOMA EVACUATION SUBDURAL;  Surgeon: Arley SHAUNNA Helling, MD;  Location: [facility] NEURO ORS;  Service: Neurosurgery;  Laterality: Right;   LEFT HEART CATH AND CORONARY ANGIOGRAPHY N/A 03/28/2023   Procedure: LEFT HEART CATH AND CORONARY ANGIOGRAPHY;  Surgeon: Burnard Debby LABOR, MD;  Location: [facility] INVASIVE CV LAB;  Service: Cardiovascular;  Laterality: N/A;    Allergies  No Known Allergies   EKGs/Labs/Other Studies Reviewed:   The following studies were reviewed today: Cardiac Studies & Procedures   CARDIAC CATHETERIZATION  CARDIAC CATHETERIZATION 03/29/2023  Narrative   Dist RCA lesion is 85% stenosed.   A drug-eluting stent was successfully placed using a SYNERGY XD 4.0X12.   Post intervention, there is a 0% residual stenosis.  Successful PCI of the distal RCA with DES  Plan; DAPT for one year. Anticipate DC tomorrow.  Findings Coronary Findings Diagnostic  Dominance: Right  Left Anterior Descending Non-stenotic Mid LAD lesion was previously treated. Non-stenotic  Mid LAD to Dist LAD lesion.  First Diagonal Branch Vessel is small in size. 1st Diag lesion is 80% stenosed.  Second Diagonal Branch Vessel is small in size.  Ramus Intermedius Ramus lesion is 20% stenosed.  Left Circumflex Vessel is small.  Right Coronary Artery Vessel is large. The vessel exhibits minimal luminal irregularities. Dist RCA lesion is 85% stenosed.  Right Posterior Descending Artery RPDA lesion is 40% stenosed.  Second Right Posterolateral Branch 2nd RPL lesion is 20% stenosed.  Intervention  Dist RCA lesion Stent CATHETER LAUNCHER 6FR AL1 guide catheter was inserted. Lesion crossed with guidewire using a WIRE ASAHI PROWATER 180CM. Pre-stent angioplasty was performed using a BALLN EMERGE MR 2.5X12. A drug-eluting stent was successfully placed using a SYNERGY XD 4.0X12. Stent strut is well apposed. Post-stent angioplasty was performed using a BALL SAPPHIRE NC24 4.5X10. Maximum pressure:  16 atm. Post-Intervention Lesion Assessment The intervention was successful. Pre-interventional TIMI flow is 3. Post-intervention TIMI flow is 3. No complications occurred at this lesion. There is a 0% residual stenosis post intervention.   CARDIAC CATHETERIZATION 03/28/2023  Narrative   Dist RCA lesion is 85% stenosed.   RPDA lesion is 40% stenosed.  Mid LAD lesion is 100% stenosed.   2nd RPL lesion is 20% stenosed.   Ramus lesion is 20% stenosed.   1st Diag lesion is 80% stenosed.   Non-stenotic Mid LAD to Dist LAD lesion.   A drug-eluting stent was successfully placed.   Post intervention, there is a 0% residual stenosis.   The left ventricular ejection fraction is 50-55% by visual estimate.  Acute coronary syndrome secondary to total LAD occlusion after the first diagonal vessel with 80% stenosis in a small caliber first diagonal vessel proximal to the total occlusion.  Mild 20% stenosis in the ramus intermediate vessel.  Small normal left circumflex  vessel.  Large dominant RCA with mild luminal irregularity and focal 85% stenosis proximal to the PDA takeoff with 40% mid PDA stenosis and 20% PL stenosis.  Mild LV dysfunction with mid distal anterolateral and apical hypocontractility.  EF estimate approximately 50%.  LVEDP 22 mm.  Successful PCI to the total LAD occlusion with ultimate insertion of a 3.0 x 15 mm Onyx frontier DES stent postdilated to 3.25 mm with 100% occlusion being reduced to 0%.  TIMI 0 flow was improved to TIMI-3 flow.  There is evidence for significant systolic bridging of the mid LAD proximal to the mid diagonal vessel.  RECOMMENDATION: DAPT with aspirin /Brilinta  for minimum of 1 year.  Will hydrate postprocedure.  Plan for staged PCI to his distal RCA which is a large-caliber vessel.  Medical therapy versus PCI to the diagonal vessel.  Findings Coronary Findings Diagnostic  Dominance: Right  Left Anterior Descending Mid LAD lesion is 100% stenosed. Non-stenotic Mid LAD to Dist LAD lesion.  First Diagonal Branch Vessel is small in size. 1st Diag lesion is 80% stenosed.  Second Diagonal Branch Vessel is small in size.  Ramus Intermedius Ramus lesion is 20% stenosed.  Left Circumflex Vessel is small.  Right Coronary Artery Vessel is large. The vessel exhibits minimal luminal irregularities. Dist RCA lesion is 85% stenosed.  Right Posterior Descending Artery RPDA lesion is 40% stenosed.  Second Right Posterolateral Branch 2nd RPL lesion is 20% stenosed.  Intervention  Mid LAD lesion Stent A drug-eluting stent was successfully placed. Stent strut is well apposed. Post-Intervention Lesion Assessment The intervention was successful. Pre-interventional TIMI flow is 0. Post-intervention TIMI flow is 3. No complications occurred at this lesion. There is a 0% residual stenosis post intervention.    ECHOCARDIOGRAM  ECHOCARDIOGRAM LIMITED 05/09/2023  Narrative ECHOCARDIOGRAM LIMITED  REPORT    Patient Name:   Frederick Knight Date of Exam: 05/09/2023 Medical Rec #:  [id number]        Height:       68.0 in Accession #:    [id number]       Weight:       159.0 lb Date of Birth:  November 14, 1951        BSA:          1.854 m Patient Age:    72 years         BP:           102/60 mmHg Patient Gender: M                HR:           99 bpm. Exam Location:  [address]  Procedure: Limited Echo, Cardiac Doppler, Limited Color Doppler and 2D Echo  Indications:    I25.5 Ischemic Cardiomyopathy  History:        Patient has prior history of Echocardiogram  examinations, most recent 03/28/2023. Previous Myocardial Infarction, Signs/Symptoms:Fatigue and Chest Pain; Risk Factors:Dyslipidemia and Diabetes. Ischemic Cardiomyopathy (prior EF 45%) with NSTEMI.  Sonographer:    Heather Hawks RDCS Referring Phys: ORREN SAILOR Walker [place]  IMPRESSIONS   1. Left ventricular ejection fraction, by estimation, is 60 to 65%. Left ventricular ejection fraction by PLAX is 65 %. The left ventricle has normal function. The left ventricle demonstrates regional wall motion abnormalities (see scoring diagram/findings for description). There is mild left ventricular hypertrophy. Left ventricular diastolic parameters are consistent with Grade I diastolic dysfunction (impaired relaxation). There is moderate hypokinesis of the left ventricular, mid-apical anteroseptal wall and apical segment. 2. Right ventricular systolic function is normal. The right ventricular size is normal. 3. The aortic valve is tricuspid. Aortic valve regurgitation is not visualized.  Comparison(s): Changes from prior study are noted. 03/28/2023: LVEF 45-50%, distal LAD territory [facility].  FINDINGS Left Ventricle: Left ventricular ejection fraction, by estimation, is 60 to 65%. Left ventricular ejection fraction by PLAX is 65 %. The left ventricle has normal function. The left ventricle demonstrates regional wall motion abnormalities.  Moderate hypokinesis of the left ventricular, mid-apical anteroseptal wall and apical segment. The left ventricular internal cavity size was normal in size. There is mild left ventricular hypertrophy. Left ventricular diastolic parameters are consistent with Grade I diastolic dysfunction (impaired relaxation). Indeterminate filling pressures.   LV Wall Scoring: The apical septal segment and apex are hypokinetic.  Right Ventricle: The right ventricular size is normal. No increase in right ventricular wall thickness. Right ventricular systolic function is normal.  Left Atrium: Left atrial size was normal in size.  Right Atrium: Right atrial size was normal in size.  Aortic Valve: The aortic valve is tricuspid. Aortic valve regurgitation is not visualized.  Aorta: The aortic root and ascending aorta are structurally normal, with no evidence of dilitation.  IAS/Shunts: No atrial level shunt detected by color flow Doppler.  LEFT VENTRICLE PLAX 2D LV EF:         Left            Diastology ventricular     LV e' medial:    7.65 cm/s ejection        LV E/e' medial:  8.2 fraction by     LV e' lateral:   12.50 cm/s PLAX is 65      LV E/e' lateral: 5.0 %. LVIDd:         2.90 cm LVIDs:         1.90 cm LV PW:         0.80 cm LV IVS:        1.10 cm LVOT diam:     2.10 cm LV SV:         69 LV SV Index:   37 LVOT Area:     3.46 cm  LV Volumes (MOD) LV vol d, MOD    56.9 ml A2C: LV vol d, MOD    79.9 ml A4C: LV vol s, MOD    12.5 ml A2C: LV vol s, MOD    35.1 ml A4C: LV SV MOD A2C:   44.4 ml LV SV MOD A4C:   79.9 ml LV SV MOD BP:    46.8 ml  RIGHT VENTRICLE RV Basal diam:  2.20 cm RV S prime:     15.00 cm/s TAPSE (M-mode): 2.4 cm  LEFT ATRIUM             Index  RIGHT ATRIUM           Index LA diam:        2.50 cm 1.35 cm/m   RA Pressure: 3.00 mmHg LA Vol (A2C):   38.0 ml 20.50 ml/m  RA Area:     15.60 cm LA Vol (A4C):   36.3 ml 19.58 ml/m  RA Volume:   37.40 ml   20.18 ml/m LA Biplane Vol: 39.3 ml 21.20 ml/m AORTIC VALVE LVOT Vmax:   126.50 cm/s LVOT Vmean:  86.950 cm/s LVOT VTI:    0.200 m  AORTA Ao Root diam: 3.60 cm Ao Asc diam:  3.50 cm  MITRAL VALVE                TRICUSPID VALVE MV Area (PHT): cm          Estimated RAP:  3.00 mmHg MV Decel Time: 167 msec MV E velocity: 62.70 cm/s   SHUNTS MV A velocity: 135.00 cm/s  Systemic VTI:  0.20 m MV E/A ratio:  0.46         Systemic Diam: 2.10 cm  Vinie Maxcy MD Electronically signed by Vinie Maxcy MD Signature Date/Time: 05/09/2023/10:27:40 PM    Final              EKG:  EKG is not ordered today.   Recent Labs: 03/28/2023: Magnesium  2.1; TSH 6.798 04/06/2023: ALT 44; BUN 20; Creatinine, Ser 1.00; Hemoglobin 15.8; Platelets 200; Potassium 4.0; Sodium 139  Recent Lipid Panel    Component Value Date/Time   CHOL 114 03/29/2023 0104   CHOL 151 01/04/2022 1133   TRIG 79 03/29/2023 0104   HDL 45 03/29/2023 0104   HDL 44 01/04/2022 1133   CHOLHDL 2.5 03/29/2023 0104   VLDL 16 03/29/2023 0104   LDLCALC 53 03/29/2023 0104   LDLCALC 84 01/04/2022 1133    Home Medications   No outpatient medications have been marked as taking for the 01/19/24 encounter (Office Visit) with Lucien Orren SAILOR, PA-C.     Review of Systems      All other systems reviewed and are otherwise negative except as noted above.  Physical Exam    VS:  BP 130/70 (BP Location: Right Arm, Patient Position: Sitting, Cuff Size: Small)   Pulse 62   Resp 16   Ht 5' 8 (1.727 m)   Wt 149 lb (67.6 kg)   SpO2 97%   BMI 22.66 kg/m  , BMI Body mass index is 22.66 kg/m.  Wt Readings from Last 3 Encounters:  01/19/24 149 lb (67.6 kg)  01/02/24 146 lb (66.2 kg)  11/03/23 143 lb (64.9 kg)     GEN: Well nourished, well developed, in no acute distress. HEENT: normal. Neck: Supple, no JVD, carotid bruits, or masses. Cardiac: RRR, no murmurs, rubs, or gallops. No clubbing, cyanosis, edema.  Radials/PT 2+ and equal  bilaterally.  Respiratory:  Respirations regular and unlabored, clear to auscultation bilaterally. GI: Soft, nontender, nondistended. MS: No deformity or atrophy. Skin: Warm and dry, no rash. Neuro:  Strength and sensation are intact. Psych: Normal affect. Some ecchymosis from cath site, no hematoma  Assessment & Plan    Hypertension Improved blood pressure control since discontinuation of Losartan  due to hypotension. Currently on Metoprolol  25mg  daily. -Continue current regimen and monitor blood pressure.  Hyperlipidemia Patient reports discontinuation of Atorvastatin  80mg  daily due to primary care physician's advice after good lipid panel results. Discussed the importance of maintaining lipid control. -Resume Atorvastatin  80mg  every other day until  next lipid panel in spring.  Anticoagulation for Stent/Ischemic CM Patient is on Eliquis  twice daily. Discussed potential discontinuation of Eliquis  if echocardiogram shows improvement in anterior akinesis. -Schedule echocardiogram in May. -Based on echocardiogram results, consider discontinuation of Eliquis .  Follow-up in 6 months.      Disposition: Follow up 6 months with Arun K Thukkani, MD or APP.  Signed, Orren LOISE Fabry, PA-C 01/19/2024, 1:48 PM [facility]

## 2024-02-13 DIAGNOSIS — L989 Disorder of the skin and subcutaneous tissue, unspecified: Secondary | ICD-10-CM | POA: Diagnosis not present

## 2024-02-15 ENCOUNTER — Other Ambulatory Visit: Payer: Self-pay | Admitting: Neurosurgery

## 2024-02-15 DIAGNOSIS — L989 Disorder of the skin and subcutaneous tissue, unspecified: Secondary | ICD-10-CM

## 2024-02-21 ENCOUNTER — Ambulatory Visit: Payer: Medicare Other | Admitting: Gastroenterology

## 2024-03-02 ENCOUNTER — Other Ambulatory Visit: Payer: Self-pay | Admitting: Student

## 2024-03-03 ENCOUNTER — Other Ambulatory Visit: Payer: Self-pay | Admitting: Student

## 2024-03-03 ENCOUNTER — Other Ambulatory Visit: Payer: Self-pay | Admitting: Physician Assistant

## 2024-03-08 ENCOUNTER — Ambulatory Visit
Admission: RE | Admit: 2024-03-08 | Discharge: 2024-03-08 | Disposition: A | Discharge status: Home / Self Care | Source: Ambulatory Visit | Attending: Neurosurgery | Admitting: Neurosurgery

## 2024-03-08 DIAGNOSIS — C4440 Unspecified malignant neoplasm of skin of scalp and neck: Secondary | ICD-10-CM | POA: Diagnosis not present

## 2024-03-08 DIAGNOSIS — I255 Ischemic cardiomyopathy: Secondary | ICD-10-CM | POA: Diagnosis not present

## 2024-03-08 DIAGNOSIS — L989 Disorder of the skin and subcutaneous tissue, unspecified: Secondary | ICD-10-CM

## 2024-03-08 DIAGNOSIS — I214 Non-ST elevation (NSTEMI) myocardial infarction: Secondary | ICD-10-CM | POA: Diagnosis not present

## 2024-04-02 ENCOUNTER — Telehealth: Payer: Self-pay | Admitting: Plastic Surgery

## 2024-04-02 NOTE — Telephone Encounter (Signed)
 Called and left a VM.

## 2024-04-16 ENCOUNTER — Institutional Professional Consult (permissible substitution): Admitting: Plastic Surgery

## 2024-04-18 ENCOUNTER — Ambulatory Visit: Admitting: Plastic Surgery

## 2024-04-18 ENCOUNTER — Encounter: Payer: Self-pay | Admitting: Plastic Surgery

## 2024-04-18 VITALS — BP 136/73 | HR 85 | Ht 67.0 in | Wt 149.0 lb

## 2024-04-18 DIAGNOSIS — T847XXA Infection and inflammatory reaction due to other internal orthopedic prosthetic devices, implants and grafts, initial encounter: Secondary | ICD-10-CM | POA: Diagnosis not present

## 2024-04-18 NOTE — Progress Notes (Signed)
 Referring Provider Lavada Porteous, DO [address] [place],  [place] [address]   CC:  Chief Complaint  Patient presents with   Skin Problem   Advice Only      Frederick Knight is an 73 y.o. male.  HPI: Mr Panciera is a 73 year old male who is referred by Dr. Bert Britain for evaluation and management of scalp breakdown over previous craniotomy site.  Patient underwent bur hole decompression of a right subdural hematoma in 2013.  He was taken back to the operating room 2 days later where a formal craniotomy was performed and the bone was reapproximated with a Biomet plating system.  The patient states that he hit his head several months ago and did not think about it at the time but since then he has had a wound on his head which has been increasing in size.  Denies any pain around the site but in the center of the wound it is painful when touched.  He does report drainage from the wound.  He denies any fever or chills at this time.  No Known Allergies  Outpatient Encounter Medications as of 04/18/2024  Medication Sig   acetaminophen  (TYLENOL ) 325 MG tablet Take 2 tablets (650 mg total) by mouth every 4 (four) hours as needed for headache or mild pain.   apixaban  (ELIQUIS ) 5 MG TABS tablet Take 1 tablet (5 mg total) by mouth 2 (two) times daily.   atorvastatin  (LIPITOR) 80 MG tablet TAKE 1 TABLET BY MOUTH EVERY DAY   clopidogrel  (PLAVIX ) 75 MG tablet TAKE 1 TABLET BY MOUTH EVERY DAY   empagliflozin  (JARDIANCE ) 10 MG TABS tablet Take 1 tablet (10 mg total) by mouth daily.   metFORMIN  (GLUCOPHAGE -XR) 500 MG 24 hr tablet TAKE 1 TABLET BY MOUTH EVERY DAY WITH BREAKFAST   metoprolol  succinate (TOPROL  XL) 25 MG 24 hr tablet Take 1 tablet (25 mg total) by mouth daily.   oxybutynin (DITROPAN-XL) 5 MG 24 hr tablet Take 5 mg by mouth daily.   tamsulosin  (FLOMAX ) 0.4 MG CAPS capsule TAKE 1 CAPSULE BY MOUTH EVERY DAY   No facility-administered encounter medications on file as of 04/18/2024.     Past  Medical History:  Diagnosis Date   Diabetes mellitus without complication (HCC)    Hyperlipemia    Controlled well with medications    Past Surgical History:  Procedure Laterality Date   COLONOSCOPY N/A 01/15/2015   RMR:  Multiple polyps removed as described above.   [facility]  01/28/2021   [facility]   COLONOSCOPY  2016   CORONARY STENT INTERVENTION N/A 03/29/2023   Procedure: CORONARY STENT INTERVENTION;  Surgeon: [place], Peter M, MD;  Location[facility] INVASIVE CV LAB;  Service: Cardiovascular;  Laterality: N/A;   CORONARY STENT INTERVENTION N/A 03/28/2023   Procedure: CORONARY STENT INTERVENTION;  Surgeon: Millicent Ally, MD;  Location: [facility] INVASIVE CV LAB;  Service: Cardiovascular;  Laterality: N/A;   CRANIOTOMY  05/16/2012   Procedure: CRANIOTOMY HEMATOMA EVACUATION SUBDURAL;  Surgeon: Ferris Hua, MD;  Location: [facility] NEURO ORS;  Service: Neurosurgery;  Laterality: N/A;  Right burr holes for evacuation of subdural hematoma   CRANIOTOMY  05/18/2012   Procedure: CRANIOTOMY HEMATOMA EVACUATION SUBDURAL;  Surgeon: Ferris Hua, MD;  Location: [facility] NEURO ORS;  Service: Neurosurgery;  Laterality: Right;   LEFT HEART CATH AND CORONARY ANGIOGRAPHY N/A 03/28/2023   Procedure: LEFT HEART CATH AND CORONARY ANGIOGRAPHY;  Surgeon: Millicent Ally, MD;  Location: [facility] INVASIVE CV LAB;  Service: Cardiovascular;  Laterality: N/A;    Family History  Problem Relation Age of Onset   Stroke Mother    Colon cancer Neg Hx    Esophageal cancer Neg Hx    Rectal cancer Neg Hx    Stomach cancer Neg Hx    Colon polyps Neg Hx     Social History   Social History Narrative   Not on file     Review of Systems General: Denies fevers, chills, weight loss CV: Denies chest pain, shortness of breath, palpitations Head: History of craniotomy for subdural hematoma approximately 12 years ago.  Trauma to the scalp recently resulted in a wound that is tender at its center but otherwise nontender.  There is  drainage from the wound.  Physical Exam    04/18/2024    2:16 PM 01/19/2024    1:20 PM 01/02/2024    3:48 PM  Vitals with BMI  Height 5\' 7"  5\' 8"    Weight 149 lbs 149 lbs 146 lbs  BMI 23.33 22.66   Systolic 136 130 161  Diastolic 73 70 66  Pulse 85 62 93    General:  No acute distress,  Alert and oriented, Non-Toxic, Normal speech and affect Scalp: On evaluation the patient has a parietal scalp wound that is approximately 2 cm across.  The plate used in his craniotomy is completely exposed and there is fluid under the plate.  There is pain to palpation.   Assessment/Plan Scalp wound, exposed plate: The patient has a scalp wound at the base of which there is a plate which is exposed.  While not grossly infected there is clearly colonization within the plate.  Patient will need soft tissue coverage for this wound.  His scalp is thin and tight and there is no give for a rotational flap.  Complicating his clinical picture is a non-STEMI MI recently with stent placement.  He is on both Plavix  and Eliquis  for this.  He states that his most recent echocardiogram shows normal ejection fraction.  Conventional treatment for this patient's problem would be free tissue transfer most likely a latissimus flap.  He would very much like to try something less aggressive if possible.  The only way that this wound might heal with something other than free tissue transfer is if the craniotomy plates can be removed.  They can then a trial of myriad under the scalp is possible though he understands it is truly a long shot and I do not have great hopes for being able to reepithelialized his wound.  He is scheduled to see Dr. Lamon Pillow in 1 week.  I will try to contact him to determine if the plates can be removed.  If they cannot he will need referral to a center that performs free tissue transfer.  50 minutes spent reviewing the patient's chart, examining the patient, discussing treatment plans, coordinating care, and  documenting    Teretha Ferguson 04/18/2024, 2:57 PM

## 2024-04-25 DIAGNOSIS — L989 Disorder of the skin and subcutaneous tissue, unspecified: Secondary | ICD-10-CM | POA: Diagnosis not present

## 2024-04-26 ENCOUNTER — Other Ambulatory Visit (HOSPITAL_COMMUNITY): Payer: Medicare Other

## 2024-04-29 ENCOUNTER — Ambulatory Visit: Admitting: Plastic Surgery

## 2024-04-29 ENCOUNTER — Encounter: Payer: Self-pay | Admitting: Plastic Surgery

## 2024-04-29 VITALS — BP 128/71 | HR 75 | Ht 66.0 in | Wt 146.6 lb

## 2024-04-29 DIAGNOSIS — T847XXA Infection and inflammatory reaction due to other internal orthopedic prosthetic devices, implants and grafts, initial encounter: Secondary | ICD-10-CM | POA: Diagnosis not present

## 2024-04-29 NOTE — Progress Notes (Signed)
 Mr. Frederick Knight returns today for follow-up evaluation of the exposed plate on the right parietal region.  He saw Dr. Lamon Pillow last week who is stated that the plate can be removed.  Mr Frederick Knight niece, a neurologist, joined us  today by video chat with his permission.  I showed her the exposed plate and loss of soft tissue as well as the plate immediately posterior to the open wound in the impending erosion through the skin.  We discussed management of these wounds.  The gold standard would be removal of the plate and resurfacing of the site with a free tissue transfer.  They understand that this is a significant procedure especially with his comorbidities.  A less complex approach though 1 that is in no way guaranteed to work would be removal of the plates and placement of myriad tissue matrix over the area of tissue loss.  The matrix would be used for reepithelialization of the wound.  Would likely require burring the outer table of the skull.  All questions were answered regarding this procedure.  Will obtain medical and cardiac clearance from Dr. Telford Feather and Dr. Lorie Rook.  Mr. Frederick Knight will need to be off of his Plavix  and Eliquis  prior to surgery and for at least 2 days postoperatively.  Will obtain clearance for this as well.  Will review the CT scan again with radiology as I am concerned that there is a paucity of bone underneath the plate.  If there is no bone in this area the patient will will need to be transferred for more definitive care and coordination with neurosurgery.  I spent 30 minutes reviewing the patient's chart and CT scan discussing care with the patient and his family coordinating care and documenting.

## 2024-04-30 ENCOUNTER — Ambulatory Visit (HOSPITAL_COMMUNITY)
Admission: RE | Admit: 2024-04-30 | Discharge: 2024-04-30 | Disposition: A | Discharge status: Home / Self Care | Source: Ambulatory Visit | Attending: Cardiology | Admitting: Cardiology

## 2024-04-30 ENCOUNTER — Telehealth: Payer: Self-pay

## 2024-04-30 ENCOUNTER — Ambulatory Visit: Payer: Self-pay | Admitting: Physician Assistant

## 2024-04-30 DIAGNOSIS — I255 Ischemic cardiomyopathy: Secondary | ICD-10-CM | POA: Insufficient documentation

## 2024-04-30 LAB — ECHOCARDIOGRAM COMPLETE
Area-P 1/2: 3.77 cm2
S' Lateral: 2.5 cm

## 2024-04-30 MED ORDER — PERFLUTREN LIPID MICROSPHERE
1.0000 mL | INTRAVENOUS | Status: AC | PRN
  Administered 2024-04-30: 3 mL via INTRAVENOUS

## 2024-04-30 NOTE — Telephone Encounter (Signed)
   Pre-operative Risk Assessment    Patient Name: Frederick Knight  DOB: 11/05/1951 MRN: [id number]   Date of last office visit: 01/19/2024 Date of next office visit: Not Scheduled   Request for Surgical Clearance    Procedure:  Scalp wound preportion, hardware removal, placement of tissue matrix  Date of Surgery:  Clearance TBD                                Surgeon:  Dr. Larraine Plush Surgeon's Group or Practice Name:  [facility] Phone number:  [phone] Fax number:  [phone]   Type of Clearance Requested:   - Medical  - Pharmacy:  Hold Clopidogrel  (Plavix ) and Apixaban  (Eliquis )     Type of Anesthesia:  General    Additional requests/questions:    Gardiner Jumper   04/30/2024, 10:03 AM

## 2024-05-01 NOTE — Telephone Encounter (Signed)
 Pharmacy please advise on holding Eliquis  prior to  Scalp wound preportion, hardware removal, placement of tissue matrix   scheduled for TBD. Thank you.

## 2024-05-02 NOTE — Telephone Encounter (Signed)
 The patient has been notified of the result and verbalized understanding.  All questions (if any) were answered.  Pt aware that per Marlyse Single PA-C (covering for Lovette Rud PA-C while on maternity leave), he reviewed his echo with Dr. Lorie Rook and they both agreed that he can stop taking Eliquis , but must continue taking his plavix  75 mg po daily.   Pt verbalized understanding and agrees with this plan.  Eliquis  was removed from the pts med list by triage RN yesterday.

## 2024-05-02 NOTE — Telephone Encounter (Signed)
 Marlyse Single T, PA-C to TRASON SHIFFLET     05/01/24  2:39 PM I have reviewed your results for Tessa while she is out of the office. Your echocardiogram demonstrates normal ejection fraction (heart function).  Your wall motion abnormalities are improved.  I reviewed the echocardiogram with Dr. Lorie Rook.  We can stop your Eliquis .  You will need to remain on clopidogrel  (Plavix ) 75 mg daily. Marlyse Single, PA-C    05/01/2024 2:36 PM    Last read by Conchetta Deeds at 7:42PM on 05/01/2024. ECHOCARDIOGRAM COMPLETE Thukkani, Arun K, MD to Gabino Joe, PA-C AT   04/30/24  6:02 PM Yes, ok to stop Eliquis  and continue plavix  monotherapy View older events  Gabino Joe, PA-C to Thukkani, Arun K, MD    04/30/24  5:45 PM Kelsey Patricia I am covering for [facility] while she is on maternity leave. This pt had a NSTEMI in 03/2023. 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 This echocardiogram seems to show improved wall motion and his EF is normal. Is it ok to stop Eliquis ? If so, since it has been a year already, should we continue him on Clopidogrel  alone? Thanks [employer]

## 2024-05-02 NOTE — Telephone Encounter (Signed)
-----   Message from Marlyse Single sent at 05/01/2024  2:39 PM EDT ----- Results sent to Conchetta Deeds via [employer].  Copy sent to PCP as FYI. PLAN: -Stop Eliquis  -Continue Clopidogrel  75 mg once daily   I have reviewed your results for Frederick Knight while she is out of the office. Your echocardiogram demonstrates normal ejection fraction (heart function).  Your wall motion abnormalities are improved.  I reviewed the echocardiogram with Dr. Lorie Rook.  We can stop your Eliquis .  You will need to remain on clopidogrel  (Plavix ) 75 mg daily. Marlyse Single, PA-C    05/01/2024 2:36 PM

## 2024-05-05 NOTE — Telephone Encounter (Signed)
 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    Procedure:  Scalp wound preportion, hardware removal, placement of tissue matrix   Date of Surgery:  Clearance TBD    CrCl 55 Platelet count 200  Patient has not had an Afib/aflutter ablation within the last 3 months or DCCV within the last 30 days  Diagnosis is not covered under PharmD protocol.  Will forward to Dr. Lorie Rook to determine clearance.   **This guidance is not considered finalized until pre-operative APP has relayed final recommendations.**

## 2024-05-07 ENCOUNTER — Telehealth: Payer: Self-pay

## 2024-05-07 NOTE — Telephone Encounter (Signed)
 S/W pt. Confirmed with pt the following:  Pt is not taking Eliquis . Pt is taking Plavix  75mg  daily.

## 2024-05-07 NOTE — Telephone Encounter (Signed)
 med rec and consent done     Patient Consent for Virtual Visit        Frederick Knight has provided verbal consent on 05/07/2024 for a virtual visit (video or telephone).   CONSENT FOR VIRTUAL VISIT FOR:  Frederick Knight  By participating in this virtual visit I agree to the following:  I hereby voluntarily request, consent and authorize [facility] and its employed or contracted physicians, physician assistants, nurse practitioners or other licensed health care professionals (the Practitioner), to provide me with telemedicine health care services (the "Services") as deemed necessary by the treating Practitioner. I acknowledge and consent to receive the Services by the Practitioner via telemedicine. I understand that the telemedicine visit will involve communicating with the Practitioner through live audiovisual communication technology and the disclosure of certain medical information by electronic transmission. I acknowledge that I have been given the opportunity to request an in-person assessment or other available alternative prior to the telemedicine visit and am voluntarily participating in the telemedicine visit.  I understand that I have the right to withhold or withdraw my consent to the use of telemedicine in the course of my care at any time, without affecting my right to future care or treatment, and that the Practitioner or I may terminate the telemedicine visit at any time. I understand that I have the right to inspect all information obtained and/or recorded in the course of the telemedicine visit and may receive copies of available information for a reasonable fee.  I understand that some of the potential risks of receiving the Services via telemedicine include:  Delay or interruption in medical evaluation due to technological equipment failure or disruption; Information transmitted may not be sufficient (e.g. poor resolution of images) to allow for appropriate medical  decision making by the Practitioner; and/or  In rare instances, security protocols could fail, causing a breach of personal health information.  Furthermore, I acknowledge that it is my responsibility to provide information about my medical history, conditions and care that is complete and accurate to the best of my ability. I acknowledge that Practitioner's advice, recommendations, and/or decision may be based on factors not within their control, such as incomplete or inaccurate data provided by me or distortions of diagnostic images or specimens that may result from electronic transmissions. I understand that the practice of medicine is not an exact science and that Practitioner makes no warranties or guarantees regarding treatment outcomes. I acknowledge that a copy of this consent can be made available to me via my patient portal [facility] MyChart), or I can request a printed copy by calling the office of [facility].    I understand that my insurance will be billed for this visit.   I have read or had this consent read to me. I understand the contents of this consent, which adequately explains the benefits and risks of the Services being provided via telemedicine.  I have been provided ample opportunity to ask questions regarding this consent and the Services and have had my questions answered to my satisfaction. I give my informed consent for the services to be provided through the use of telemedicine in my medical care

## 2024-05-07 NOTE — Telephone Encounter (Signed)
   Name: Frederick Knight  DOB: October 30, 1951  MRN: [id number]  Primary Cardiologist: Arun K Thukkani, MD   Preoperative team, please contact this patient and set up a phone call appointment for further preoperative risk assessment. Please obtain consent and complete medication review. Thank you for your help.  I confirm that guidance regarding antiplatelet and oral anticoagulation therapy has been completed and, if necessary, noted below.  Patient has confirmed that he is no longer using Eliquis  and is currently on Plavix  75 mg.  Per protocol patient can hold Plavix  75 mg 5 days prior to procedure and should restart postprocedure when surgically safe and hemostasis is achieved.  I also confirmed the patient resides in the state of [place] . As per [facility] Medical Board telemedicine laws, the patient must reside in the state in which the provider is licensed.   Francene Ing, Retha Cast, NP 05/07/2024, 3:57 PM [facility]

## 2024-05-07 NOTE — Telephone Encounter (Signed)
 S/W pt and scheduled TELE Preop appt 05/15/24. med rec and consent done

## 2024-05-09 ENCOUNTER — Other Ambulatory Visit: Payer: Self-pay | Admitting: Physician Assistant

## 2024-05-15 ENCOUNTER — Ambulatory Visit: Attending: Cardiology

## 2024-05-15 DIAGNOSIS — Z01810 Encounter for preprocedural cardiovascular examination: Secondary | ICD-10-CM | POA: Diagnosis not present

## 2024-05-15 NOTE — Progress Notes (Signed)
 Virtual Visit via Telephone Note   Because of Frederick Knight co-morbid illnesses, he is at least at moderate risk for complications without adequate follow up.  This format is felt to be most appropriate for this patient at this time.  Due to technical limitations with video connection (technology), today's appointment will be conducted as an audio only telehealth visit, and Frederick Knight verbally agreed to proceed in this manner.   All issues noted in this document were discussed and addressed.  No physical exam could be performed with this format.  Evaluation Performed:  Preoperative cardiovascular risk assessment _____________   Date:  05/15/2024   Patient ID:  Frederick, Knight 03-Nov-1951, MRN [id number] Patient Location:  Home Provider location:   Office  Primary Care Provider:  Lavada Porteous, DO Primary Cardiologist:  Arun K Thukkani, MD  Chief Complaint / Patient Profile  73 y.o. y/o male with a h/o CAD with stenting of the RCA in 03/2023, hyperlipidemia, GERD, BPH who is pending scalp wound preportion, hardware removal, placement of tissue matrix  and presents today for telephonic preoperative cardiovascular risk assessment. History of Present Illness  Frederick Knight is a 73 y.o. male who presents via audio/video conferencing for a telehealth visit today.  Pt was last seen in cardiology clinic on 01/19/24 by Vivien Grout.  At that time Frederick Knight was doing well.  The patient is now pending procedure as outlined above. Since his last visit, he has remained stable from a cardiac standpoint. Patient is able to achieve greater than 4 METs of activity. Today he denies chest pain, shortness of breath, lower extremity edema, fatigue, palpitations, melena, hematuria, hemoptysis, diaphoresis, weakness, presyncope, syncope, orthopnea, and PND.  Past Medical History    Past Medical History:  Diagnosis Date   Diabetes mellitus without complication (HCC)    Hyperlipemia     Controlled well with medications   Past Surgical History:  Procedure Laterality Date   COLONOSCOPY N/A 01/15/2015   RMR:  Multiple polyps removed as described above.   COLONOSCOPY  01/28/2021   [facility]   COLONOSCOPY  2016   CORONARY STENT INTERVENTION N/A 03/29/2023   Procedure: CORONARY STENT INTERVENTION;  Surgeon: Swazil[place], Peter M, MD;  [facility] INVASIVE CV LAB;  Service: Cardiovascular;  Laterality: N/A;   CORONARY STENT INTERVENTION N/A 03/28/2023   Procedure: CORONARY STENT INTERVENTION;  Surgeon: Millicent Ally, MD;  Location: [facility] INVASIVE CV LAB;  Service: Cardiovascular;  Laterality: N/A;   CRANIOTOMY  05/16/2012   Procedure: CRANIOTOMY HEMATOMA EVACUATION SUBDURAL;  Surgeon: Ferris Hua, MD;  Location: [facility] NEURO ORS;  Service: Neurosurgery;  Laterality: N/A;  Right burr holes for evacuation of subdural hematoma   CRANIOTOMY  05/18/2012   Procedure: CRANIOTOMY HEMATOMA EVACUATION SUBDURAL;  Surgeon: Ferris Hua, MD;  Location: [facility] NEURO ORS;  Service: Neurosurgery;  Laterality: Right;   LEFT HEART CATH AND CORONARY ANGIOGRAPHY N/A 03/28/2023   Procedure: LEFT HEART CATH AND CORONARY ANGIOGRAPHY;  Surgeon: Millicent Ally, MD;  Location: [facility] INVASIVE CV LAB;  Service: Cardiovascular;  Laterality: N/A;   Allergies No Known Allergies Home Medications    Prior to Admission medications   Medication Sig Start Date End Date Taking? Authorizing Provider  acetaminophen  (TYLENOL ) 325 MG tablet Take 2 tablets (650 mg total) by mouth every 4 (four) hours as needed for headache or mild pain. 03/30/23   Santa Cuba, MD  atorvastatin  (LIPITOR) 80 MG tablet TAKE 1 TABLET BY MOUTH  EVERY DAY 10/18/23   Von Grumbling, PA-C  clopidogrel  (PLAVIX ) 75 MG tablet TAKE 1 TABLET BY MOUTH EVERY DAY 05/09/24   Von Grumbling, PA-C  empagliflozin  (JARDIANCE ) 10 MG TABS tablet Take 1 tablet (10 mg total) by mouth daily. 01/19/24   Von Grumbling, PA-C  metFORMIN  (GLUCOPHAGE -XR) 500 MG 24 hr tablet TAKE 1  TABLET BY MOUTH EVERY DAY WITH BREAKFAST 03/04/24   Lavada Porteous, DO  metoprolol  succinate (TOPROL  XL) 25 MG 24 hr tablet Take 1 tablet (25 mg total) by mouth daily. 05/26/23   Thukkani, Arun K, MD  oxybutynin (DITROPAN-XL) 5 MG 24 hr tablet Take 5 mg by mouth daily. 07/25/23   [provider]  tamsulosin  (FLOMAX ) 0.4 MG CAPS capsule TAKE 1 CAPSULE BY MOUTH EVERY DAY 03/04/24   Lavada Porteous, DO    Physical Exam    Vital Signs:  Frederick Knight does not have vital signs available for review today.  Given telephonic nature of communication, physical exam is limited. AAOx3. NAD. Normal affect.  Speech and respirations are unlabored.  Accessory Clinical Findings  None Assessment & Plan    1.  Preoperative Cardiovascular Risk Assessment:  Frederick Knight perioperative risk of a major cardiac event is 6.6% according to the Revised Cardiac Risk Index (RCRI).  Therefore, he is at high risk for perioperative complications.   His functional capacity is good at 6.36 METs according to the Duke Activity Status Index (DASI). Recommendations: According to ACC/AHA guidelines, no further cardiovascular testing needed.  The patient may proceed to surgery at acceptable risk.   Antiplatelet and/or Anticoagulation Recommendations: Patient has confirmed that he is no longer using Eliquis  and is currently on Plavix  75 mg. Per protocol patient can hold Plavix  75 mg for five days prior to procedure and should restart postprocedure when surgically safe and hemostasis is achieved.  The patient was advised that if he develops new symptoms prior to surgery to contact our office to arrange for a follow-up visit, and he verbalized understanding.  A copy of this note will be routed to requesting surgeon.  Time:   Today, I have spent 10 minutes with the patient with telehealth technology discussing medical history, symptoms, and management plan.    Frederick Holston D Lesli Issa, NP  05/15/2024, 10:29 AM

## 2024-05-22 ENCOUNTER — Ambulatory Visit: Admitting: Plastic Surgery

## 2024-05-22 ENCOUNTER — Encounter: Payer: Self-pay | Admitting: Plastic Surgery

## 2024-05-22 VITALS — BP 128/67 | HR 74 | Ht 67.0 in | Wt 147.0 lb

## 2024-05-22 DIAGNOSIS — T847XXD Infection and inflammatory reaction due to other internal orthopedic prosthetic devices, implants and grafts, subsequent encounter: Secondary | ICD-10-CM | POA: Diagnosis not present

## 2024-05-22 DIAGNOSIS — T847XXA Infection and inflammatory reaction due to other internal orthopedic prosthetic devices, implants and grafts, initial encounter: Secondary | ICD-10-CM

## 2024-05-22 NOTE — Progress Notes (Signed)
 After Frederick Knight last appointment I reviewed the CT scan of his head with the radiologist.  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  I discussed this with him today and his daughter who is a physician who joined us  by FaceTime.  I do not believe that removing the hardware and placing a tissue matrix over this is a reasonable plan.  I believe that he needs to be seen by both neurosurgery and plastic surgery that can provide a free tissue transfer understanding that he has not an ideal candidate due to his medical issues.  They understand and have asked that I place a referral to [name] for neurosurgical evaluation and plastic surgery evaluation for reconstruction of the defect.  I spent 30 minutes discussing the case with this radiology, discussing the and change in treatment plans with the patient and his daughter, coordinating care, and documenting.

## 2024-05-24 DIAGNOSIS — T8489XA Other specified complication of internal orthopedic prosthetic devices, implants and grafts, initial encounter: Secondary | ICD-10-CM | POA: Diagnosis not present

## 2024-06-03 ENCOUNTER — Other Ambulatory Visit: Payer: Self-pay

## 2024-06-03 MED ORDER — METOPROLOL SUCCINATE ER 25 MG PO TB24: 25.0000 mg | ORAL_TABLET | Freq: Every day | ORAL | 2 refills | Status: DC

## 2024-06-04 DIAGNOSIS — M869 Osteomyelitis, unspecified: Secondary | ICD-10-CM | POA: Diagnosis not present

## 2024-06-04 DIAGNOSIS — T85698A Other mechanical complication of other specified internal prosthetic devices, implants and grafts, initial encounter: Secondary | ICD-10-CM | POA: Diagnosis not present

## 2024-06-04 DIAGNOSIS — Z792 Long term (current) use of antibiotics: Secondary | ICD-10-CM | POA: Diagnosis not present

## 2024-06-04 DIAGNOSIS — S0100XA Unspecified open wound of scalp, initial encounter: Secondary | ICD-10-CM | POA: Diagnosis not present

## 2024-06-04 DIAGNOSIS — Z452 Encounter for adjustment and management of vascular access device: Secondary | ICD-10-CM | POA: Diagnosis not present

## 2024-06-04 DIAGNOSIS — I2510 Atherosclerotic heart disease of native coronary artery without angina pectoris: Secondary | ICD-10-CM | POA: Diagnosis not present

## 2024-06-04 DIAGNOSIS — Z7902 Long term (current) use of antithrombotics/antiplatelets: Secondary | ICD-10-CM | POA: Diagnosis not present

## 2024-06-04 DIAGNOSIS — E119 Type 2 diabetes mellitus without complications: Secondary | ICD-10-CM | POA: Diagnosis not present

## 2024-06-04 DIAGNOSIS — Z955 Presence of coronary angioplasty implant and graft: Secondary | ICD-10-CM | POA: Diagnosis not present

## 2024-06-04 DIAGNOSIS — T84498A Other mechanical complication of other internal orthopedic devices, implants and grafts, initial encounter: Secondary | ICD-10-CM | POA: Diagnosis not present

## 2024-06-04 DIAGNOSIS — T84218A Breakdown (mechanical) of internal fixation device of other bones, initial encounter: Secondary | ICD-10-CM | POA: Diagnosis not present

## 2024-06-04 DIAGNOSIS — T8579XA Infection and inflammatory reaction due to other internal prosthetic devices, implants and grafts, initial encounter: Secondary | ICD-10-CM | POA: Diagnosis not present

## 2024-06-04 DIAGNOSIS — I252 Old myocardial infarction: Secondary | ICD-10-CM | POA: Diagnosis not present

## 2024-06-04 DIAGNOSIS — Z7984 Long term (current) use of oral hypoglycemic drugs: Secondary | ICD-10-CM | POA: Diagnosis not present

## 2024-06-04 DIAGNOSIS — Z9189 Other specified personal risk factors, not elsewhere classified: Secondary | ICD-10-CM | POA: Diagnosis not present

## 2024-06-04 DIAGNOSIS — E785 Hyperlipidemia, unspecified: Secondary | ICD-10-CM | POA: Diagnosis not present

## 2024-06-05 DIAGNOSIS — T84498A Other mechanical complication of other internal orthopedic devices, implants and grafts, initial encounter: Secondary | ICD-10-CM | POA: Diagnosis not present

## 2024-06-05 DIAGNOSIS — Z9189 Other specified personal risk factors, not elsewhere classified: Secondary | ICD-10-CM | POA: Diagnosis not present

## 2024-06-06 DIAGNOSIS — T84498A Other mechanical complication of other internal orthopedic devices, implants and grafts, initial encounter: Secondary | ICD-10-CM | POA: Diagnosis not present

## 2024-06-06 DIAGNOSIS — E119 Type 2 diabetes mellitus without complications: Secondary | ICD-10-CM | POA: Diagnosis not present

## 2024-06-06 DIAGNOSIS — Z9189 Other specified personal risk factors, not elsewhere classified: Secondary | ICD-10-CM | POA: Diagnosis not present

## 2024-06-10 DIAGNOSIS — T8579XA Infection and inflammatory reaction due to other internal prosthetic devices, implants and grafts, initial encounter: Secondary | ICD-10-CM | POA: Diagnosis not present

## 2024-06-11 DIAGNOSIS — T85698A Other mechanical complication of other specified internal prosthetic devices, implants and grafts, initial encounter: Secondary | ICD-10-CM | POA: Diagnosis not present

## 2024-06-11 NOTE — Discharge Summary (Addendum)
 Discharge Summary  Admit date: 06/04/2024  Discharge date and time: 06/11/24  Discharge to:  Home  Discharge Service: Surg Plastic [facility])  Discharge Attending Physician: Macario Jenkins Power, MD  Discharge  Diagnoses: Exposed hardware   Secondary Diagnosis: Principal Problem:   Exposed orthopaedic hardware (POA: Unknown) Resolved Problems:   * No resolved hospital problems. *   OR Procedures:   Midline - ADJACENT TRANSF SCLP/ARMS/LEGS; 10 SQ CM/LESS Right - REMOVAL OF BONE FLAP OR PROSTHETIC PLATE OF SKULL Date 06/04/2024 -------------------   Ancillary Procedures: no procedures  Discharge Day Services:   Subjective  No acute events overnight. Pain Controlled.Mild headache, but doing ok. Tolerating PO, eating regular diet.   Objective  Patient Vitals for the past 8 hrs:  BP Temp Temp src Pulse Resp SpO2  06/11/24 1141 124/78 36.5 C (97.7 F) Oral 94 18 97 %  06/11/24 0744 131/71 36.5 C (97.7 F) Oral 66 20 100 %   I/O this shift: In: 120 [P.O.:120] Out: 400 [Urine:400]  General: lying in bed in NAD Head: two incisions: one on the R superior temporal aspect. Sutures in place. Some echymosis along the incision. Second relaxing incision on the lower R temporal area. Incisions clean, dry, intact. No active bleeding, hematoma, or signs of infection. No palpable fluid collections.  Lungs: NWOB on RA  Hospital Course:  Frederick Knight is a 73 y.o. male with history of DM2, CAD status post cardiac stent in 2024 currently on Plavix , craniotomy for subdural hematoma in 2013 with placement of cranial plates who presents with erosion of hardware through skin x 5 months. The patient was taken to the OR on 06/04/2024 for removal of exposed hardware and complex scalp closure. He tolerated the procedure well, was extubated in the OR, taken to the PACU where he received routine postoperative care and then transferred to the floor. He did well postoperatively. His diet was slowly  advanced, and at the time of discharge he was tolerating a regular diet. The patient was able to void spontaneously, have his pain controlled with P.O. pain medication, and returned to the preoperative ambulatory status. Infectious disease team evaluated the patient and determined he is to stay on IV antibiotics. Adequate antibiotic regimen and antibiotic levels were established. Home infusion and OPAT referral was completed and set up prior to discharge. He will be discharged home 7 Days Post-Op in stable condition.   Condition at Discharge: Improved Discharge Medications:    Medication List    START taking these medications   . gabapentin 100 MG capsule; Commonly known as: NEURONTIN; Take 1 capsule  (100 mg total) by mouth Three (3) times a day. . methocarbamol 500 MG tablet; Commonly known as: ROBAXIN; Take 0.5  tablets (250 mg total) by mouth Three (3) times a day for 14 days. SABRA oxyCODONE 5 MG immediate release tablet; Commonly known as: ROXICODONE;  Take 0.5 tablets (2.5 mg total) by mouth every four (4) hours as needed  for up to 5 days.   CONTINUE taking these medications   . acetaminophen  325 MG tablet; Commonly known as: TYLENOL  . atorvastatin  80 MG tablet; Commonly known as: LIPITOR . clopidogrel  75 mg tablet; Commonly known as: PLAVIX  . empagliflozin  10 mg tablet; Commonly known as: JARDIANCE  . metFORMIN  500 MG 24 hr tablet; Commonly known as: GLUCOPHAGE -XR . metoPROLOL  succinate 25 MG 24 hr tablet; Commonly known as: Toprol -XL . tamsulosin  0.4 mg capsule; Commonly known as: FLOMAX     Pending Test Results:   Discharge Instructions:  Activity:   Diet:  Other Instructions: Other Instructions     Discharge instructions     [facility]  DISCHARGE INSTRUCTIONS FOR: - Dr. Delon Myron MD - Dr. Macario Power MD - Dr. Valentin Ruth MD - Dr. Comer Robeline MD - Dr. Franky Dupont MD - Dr. Andrew Kiang MD - Dr. Pauline Stapler MD - Dr. Cordella Hickman MD - Payton  Leonhardt PA-C  Procedure performed: Removal of hardware/adjacent tissue rearrangement   Medications:  Antibiotics: - You were prescribed antibiotics after surgery. You should take your antibiotic as prescribed. Do not stop taking your antibiotic early. - You should eat yogurt or take probiotic supplements with your antibiotics to help prevent diarrhea. - Please contact your Infectious Disease doctors with any questions regarding your IV antibiotics/antibiotic duration  Pain and pain medication: - You may alternate acetaminophen  (Tylenol ) and ibuprofen (Advil or Motrin), both available over-the-counter for pain relief. Follow the instructions on the bottle. Do not take more than 4 grams (4,000 mg) of acetaminophen /Tylenol  per day. - You have been prescribed gabapentin (Neurontin) and Robaxin as part of a pain control regimen. You should take these every day, as directed/prescribed, to help reduce your pain after surgery. - You have been prescribed a narcotic pain medication. Please take only as directed/prescribed. Do not drive, operate heavy machinery, or make important decisions while taking these medications. Do not drink alcohol or take illegal drugs while on narcotic pain medication. These medications may cause constipation, itching, and nausea.  Stool softeners: - Prescription narcotic pain medication, such as oxycodone, Percocet, or Vicodin, can cause constipation. Anaesthesia may also cause constipation. If you become constipated after surgery, you should take polyethylene glycol (Miralax) or sennokot (Senna) to help you have regular bowel movements. Follow the instructions on the bottle. - If you are constipated after surgery, you should ensure that you have adequate fiber in your diet (>25 grams per day) and drink at least 64oz of water  daily. If you become constipated even after using these medications, you may take magnesium  citrate. Follow the instructions on the bottle.  Blood  thinners: - You can restart your home Plavix  on POD5 on 6/29.  Home medications: - You should resume your home medications, except certain medications as directed in your discharge paperwork. Your discharge paperwork has a list of changes to how you should take your home medications. - Please restart your Plavix  on 6/29   Restrictions:  Activity Restrictions: - Do not exercise, No exercise, no strenuous activity, no yoga. Do not lift any objects heavier than 5 lbs. - No housework, yardwork, dishwashing, lawn mowing, shovelling, sweeping, Swiffering, cleaning the counters, gardening, dog-walking, swimming, exercising of any kind, yoga, Pilates. - Walking is encouraged. Do not power-walk, but you may go for gentle walks. - Keep the head of your bed elevated at all times.  What foods can I eat?: Normal: You may resume eating normal foods. You do not have any food restrictions.  Showering and Bathing: - You may shower 48 hours after surgery. When you shower, allow warm water  and soap to run over the surgical site. Dab the area dry, but do not scrub the area. Keep the surgical site clean and dry at all times otherwise. - Absolutely no submerging under water . No baths, no hot tubs, no swimming, no pools, no lakes, no rivers, no oceans, etc. Doing so will significantly increase your risk of infection.   Wounds and Dressings:  Surgical Incisions: - You have absorbable stitches in your  incision. These will not need to be removed, and will dissolve on their own. - You have stitches in your incision that will need to be removed. Your surgeon will determine when to remove these. - The incision is covered with antibiotic ointment and a yellow gauze called Xeroform. Every day, you should reapply the ointment and a new piece of Xeroform. If you are allowed to shower, change the dressing after showering.  Wounds and Dressings: - You have gauze padding over your surgical site(s). You should change  this dressing once per day. - You have absorbant dressings called ABD pads. You should change these daily, as well as when they become soiled. - Keep your dressing(s) clean and dry. If you see some light bloody wound seepage through the bandage, do not worry as this is normal. Replace your dressings if needed. - Continue dressing the site(s) of surgery until your follow up appointment. When should I call my surgeon?:  Please call if you develop any of the following symptoms: - Fever, i.e. temperature greater than 101.79F (or 38.5C) for adults, or 100.13F (or 38.0C) for children - Persistent nausea or vomiting that does not go away - Severe pain that cannot be controlled with prescription and over-the-counter pain medication - Rapidly increasing swelling of at the site of surgery - Redness, tenderness, foul odor, or thick green or yellow discharge from a surgical site - Large amounts of blood coming from your incisions or surgical sites - Any other concerning symptom   APPOINTMENTS / QUESTIONS For NON-urgent questions regarding appointments or your surgical care, you may reach out to your provider and/or their nursing staff via MyChart. Please keep in mind that it may take up to 2-3 business days for a response.  Additionally, you may contact our clinics, Monday through Friday, 8 am to 4:30 pm  [facility] Plastic Surgery at [facility] [address],  [place] [address] T: [phone] F: [phone]  [facility] [address] [place], [place] [address] [facility] T: [phone] F: [phone]  [facility] at [facility] [address], [place], [place] [address] T: [phone] F: [phone]  [facility] Plastic Surgery at [facility] [address] [place], [place] [address] Third Floor, [address] T: [phone]  F: [phone]    Physicians Delon Reid Myron MD:  Nurse:  Nat Moons BSN,  RN, CPSN [phone]  Macario Power MD:  Nurse:  Nat Benders BSN, RN, CPSN [phone]   Adeyemi Nolene Stapler MD: Nurse: Reine Ravel BSN, RN, [place]  [phone]  Valentin Ruth MD:             Nurse: Leeroy Horde BSN, RN [phone]  Comer Dale MD Nurse: Leeroy Horde BSN, RN [phone]  Franky Dupont MD  Nurse: Corean Pouch BSN, RN [phone]  Andrew Kiang MD Nurse: Corean Pouch BSN, RN [phone]  APPs Charlies FORBES Hasten DNP, NP-C:              Nurse: Leeroy Horde BSN, RN [phone]  Gabriella Del, MPAS PA-C:              Nurse: Corean Pouch BSN, RN [phone]  Tita Jabier Wilfred ALPHONSA, PA-C Nurse: Leeroy Horde BSN, RN [phone]   Surgery Scheduling Please allow 2-3 weeks from your clinic visit to when you should receive a call about scheduling surgery. If you have not heard from our office after 2-3 weeks regarding a date for your anticipated surgery or procedure, then please give  our scheduler a call at the number below. Additionally, if you need to cancel a surgical date, please notify our scheduler as soon as possible.  Surgery Scheduler: Heath Slade [phone]   FOR URGENT ISSUES AFTER HOURS/HOLIDAYS: For urgent issues after-hours (after 5pm, or weekends): call the [facility] operator [phone]) and ask to page the Plastic Surgery resident on call. You will be directed to a surgery resident who likely is not immediately aware of the details of your case, but can help you deal with any emergencies that cannot wait until regular business hours.  Please be aware that this person is responding to many in-hospital emergencies and patient issues and may not answer your phone call immediately, but will return your call as soon as possible.  For emergencies, please call 911 or report to your nearest emergency department.     Follow-Up Appointments: A follow-up appointment has been requested for 1-2 week(s) from today. You  should receive a call from our schedulers within 1-3 days. If you do not receive a call within 1-3 days, please call the [facility] clinic to set up your appointment.      Labs and Other Follow-ups after Discharge: Follow Up instructions and Outpatient Referrals    Ambulatory Referral to Home Infusion     Reason for referral: exposed hardware   Performing location?: Other   Home Health Requested Disciplines: Nursing   Requested follow up plan: I would resume responsibility.    **Please contact your service pharmacist for assistance with discharge  home health infusion monitoring.     Discharge instructions       Future Appointments: Appointments which have been scheduled for you    Jun 12, 2024 2:00 PM (Arrive by 1:45 PM) POST OP with Macario Jenkins Power, MD [facility] PLASTIC AND RECONSTRUCTIVE SURGERY AT [facility]) [address] [place] [address] [phone]

## 2024-06-11 NOTE — BH Treatment Plan (Signed)
 VIR BRIEF TREATMENT PLAN  06/11/24 3:58 PM  Notified by primary team Jessica Fava MD) that PICC line has been replaced by VAT team so the patient no longer needs VIR services. Please re-consult if further needs arise.    [name], FNP-C, PNP-AC  Vascular Interventional Radiology [facility]

## 2024-06-12 ENCOUNTER — Telehealth: Payer: Self-pay

## 2024-06-12 DIAGNOSIS — T8579XA Infection and inflammatory reaction due to other internal prosthetic devices, implants and grafts, initial encounter: Secondary | ICD-10-CM | POA: Diagnosis not present

## 2024-06-12 NOTE — Transitions of Care (Post Inpatient/ED Visit) (Signed)
   06/12/2024  Name: Frederick Knight MRN: [id number] DOB: Jul 24, 1951  Today's TOC FU Call Status: Today's TOC FU Call Status:: Unsuccessful Call (1st Attempt) Unsuccessful Call (1st Attempt) Date: 06/12/24  Attempted to reach the patient regarding the most recent Inpatient/ED visit. TOC RN CM left confidential generic voice mail with call back number on this outreach attempt.   Follow Up Plan: Additional outreach attempts will be made to reach the patient to complete the Transitions of Care (Post Inpatient/ED visit) call.    Bing Edison MSN, RN RN Case Sales executive Health  [facility]Population Health Office Hours M-F [phone] Direct Dial: [phone] Main Phone [phone]  Fax: [phone] [email or web address]

## 2024-06-13 ENCOUNTER — Telehealth: Payer: Self-pay

## 2024-06-13 NOTE — Transitions of Care (Post Inpatient/ED Visit) (Signed)
   06/13/2024  Name: JAIDAN PREVETTE MRN: [id number] DOB: 1950/12/30  Today's TOC FU Call Status: Today's TOC FU Call Status:: Unsuccessful Call (2nd Attempt) Unsuccessful Call (2nd Attempt) Date: 06/13/24  Attempted to reach the patient regarding the most recent Inpatient/ED visit.  Follow Up Plan: Additional outreach attempts will be made to reach the patient to complete the Transitions of Care (Post Inpatient/ED visit) call.    Bing Edison MSN, RN RN Case Sales executive Health  [facility]Population [facility] Office Hours M-F [phone] Direct Dial: [phone] Main Phone [phone]  Fax: [phone] [email or web address]

## 2024-06-18 ENCOUNTER — Telehealth: Payer: Self-pay

## 2024-06-18 DIAGNOSIS — J449 Chronic obstructive pulmonary disease, unspecified: Secondary | ICD-10-CM | POA: Diagnosis not present

## 2024-06-18 DIAGNOSIS — I119 Hypertensive heart disease without heart failure: Secondary | ICD-10-CM | POA: Diagnosis not present

## 2024-06-18 DIAGNOSIS — T8579XA Infection and inflammatory reaction due to other internal prosthetic devices, implants and grafts, initial encounter: Secondary | ICD-10-CM | POA: Diagnosis not present

## 2024-06-18 NOTE — Transitions of Care (Post Inpatient/ED Visit) (Signed)
   06/18/2024  Name: Frederick Knight MRN: [id number] DOB: 11/26/1951  Today's TOC FU Call Status: Today's TOC FU Call Status:: Unsuccessful Call (3rd Attempt) Unsuccessful Call (3rd Attempt) Date: 06/18/24  Attempted to reach the patient regarding the most recent Inpatient/ED visit.  Follow Up Plan: No further outreach attempts will be made at this time. We have been unable to contact the patient.   Bing Edison MSN, RN RN Case Sales executive Health  [facility]Population Health Office Hours M-F [phone] Direct Dial: [phone] Main Phone [phone]  Fax: [phone] [email or web address]

## 2024-06-20 DIAGNOSIS — T8579XA Infection and inflammatory reaction due to other internal prosthetic devices, implants and grafts, initial encounter: Secondary | ICD-10-CM | POA: Diagnosis not present

## 2024-06-25 DIAGNOSIS — T8579XA Infection and inflammatory reaction due to other internal prosthetic devices, implants and grafts, initial encounter: Secondary | ICD-10-CM | POA: Diagnosis not present

## 2024-06-25 DIAGNOSIS — I119 Hypertensive heart disease without heart failure: Secondary | ICD-10-CM | POA: Diagnosis not present

## 2024-06-25 DIAGNOSIS — T847XXD Infection and inflammatory reaction due to other internal orthopedic prosthetic devices, implants and grafts, subsequent encounter: Secondary | ICD-10-CM | POA: Diagnosis not present

## 2024-06-25 DIAGNOSIS — Z792 Long term (current) use of antibiotics: Secondary | ICD-10-CM | POA: Diagnosis not present

## 2024-06-25 DIAGNOSIS — E1169 Type 2 diabetes mellitus with other specified complication: Secondary | ICD-10-CM | POA: Diagnosis not present

## 2024-06-28 DIAGNOSIS — T8579XA Infection and inflammatory reaction due to other internal prosthetic devices, implants and grafts, initial encounter: Secondary | ICD-10-CM | POA: Diagnosis not present

## 2024-07-02 DIAGNOSIS — T8579XA Infection and inflammatory reaction due to other internal prosthetic devices, implants and grafts, initial encounter: Secondary | ICD-10-CM | POA: Diagnosis not present

## 2024-07-05 DIAGNOSIS — T8579XA Infection and inflammatory reaction due to other internal prosthetic devices, implants and grafts, initial encounter: Secondary | ICD-10-CM | POA: Diagnosis not present

## 2024-07-09 DIAGNOSIS — T8579XA Infection and inflammatory reaction due to other internal prosthetic devices, implants and grafts, initial encounter: Secondary | ICD-10-CM | POA: Diagnosis not present

## 2024-07-15 ENCOUNTER — Other Ambulatory Visit: Payer: Self-pay | Admitting: Internal Medicine

## 2024-07-15 DIAGNOSIS — T84498A Other mechanical complication of other internal orthopedic devices, implants and grafts, initial encounter: Secondary | ICD-10-CM | POA: Diagnosis not present

## 2024-07-15 DIAGNOSIS — T8579XA Infection and inflammatory reaction due to other internal prosthetic devices, implants and grafts, initial encounter: Secondary | ICD-10-CM | POA: Diagnosis not present

## 2024-07-15 DIAGNOSIS — Z452 Encounter for adjustment and management of vascular access device: Secondary | ICD-10-CM | POA: Diagnosis not present

## 2024-07-15 DIAGNOSIS — I214 Non-ST elevation (NSTEMI) myocardial infarction: Secondary | ICD-10-CM

## 2024-07-15 DIAGNOSIS — T847XXD Infection and inflammatory reaction due to other internal orthopedic prosthetic devices, implants and grafts, subsequent encounter: Secondary | ICD-10-CM | POA: Diagnosis not present

## 2024-07-15 NOTE — Telephone Encounter (Signed)
 Eliquis  5mg  refill request received. Patient is 72 years old, weight-66.7kg, Crea-1.0 on 04/06/23-needs labs, Diagnosis-, and last seen by Katlyn West on 05/15/24 via Tele Visit. Dose is appropriate based on dosing criteria.     Per 05/15/24 televisit it states: Patient has confirmed that he is no longer using Eliquis  and is currently on Plavix  75 mg. Also, the eliquis  is not on his medlist and please refer to 04/30/24 results follow up note.

## 2024-08-02 NOTE — Progress Notes (Deleted)
 Cardiology Office Note:   Date:  08/02/2024  ID:  Frederick Knight, Frederick Knight Sep 21, 1951, MRN [id number] PCP:  Howell Lunger, DO  [facility] Providers Cardiologist:  Wendel Haws, MD Referring MD: Howell Lunger, DO  Chief Complaint/Reason for Referral: Follow-up acute coronary syndrome ASSESSMENT:    1. Acute coronary syndrome (HCC)   2. Ischemic cardiomyopathy   3. Type 2 diabetes mellitus without complication, without long-term current use of insulin  (HCC)   4. Hypertension associated with diabetes (HCC)   5. Hyperlipidemia associated with type 2 diabetes mellitus (HCC)   6. Aortic atherosclerosis (HCC)     PLAN:   In order of problems listed above: Acute coronary syndrome: Continue Plavix  75 mg, atorvastatin  80 mg, start as needed nitroglycerin *** Ischemic cardiomyopathy: Ejection fraction has normalized with medical therapy.  Continue Toprol  25 mg, Jardiance  10 mg T2DM: Continue Plavix  75 mg, atorvastatin  80 mg, Jardiance  10 mg; ARB?*** Hypertension: Continue Toprol  XL 25 mg*** Hyperlipidemia: Continue atorvastatin  80 mg; check lipid panel and LFTs today.  Given history of acute coronary syndrome goal LDL is less than 55*** Aortic atherosclerosis: Continue Plavix  75 mg, atorvastatin  80 mg        {Are you ordering a CV Procedure (e.g. stress test, cath, DCCV, TEE, etc)?   Press F2        :[id number]}   Dispo:  No follow-ups on file.       I spent *** minutes reviewing all clinical data during and prior to this visit including all relevant imaging studies, laboratories, clinical information from other health systems and prior notes from both Cardiology and other specialties, interviewing the patient, conducting a complete physical examination, and coordinating care in order to formulate a comprehensive and personalized evaluation and treatment plan.   History of Present Illness:    FOCUSED PROBLEM LIST:   ACS April 2024 PCI mid LAD DES x 1 Stage PCI distal RCA DES x  1 Ischemic cardiomyopathy G1 DD, no significant valve issues, EF 45 to 50% TTE April 2024 G1 DD, no significant valve issues EF 60 to 65% TTE May 2024 Diastolic function indeterminate, no significant valve issues, EF 55 to 60% TTE May 2025 Hypertension Hyperlipidemia LP(a) 21.9 Aortic atherosclerosis Chest x-ray 2024 T2DM Not on insulin  Subdural hematoma 2013 Status post cranial plate placement Hardware erosion with removal June 2025 BMI 02 September 2024:  Patient consents to use of AI scribe. The patient returns for routine follow-up.  He was last seen in February 2025.  At that point in time he was doing well without cardiovascular complaints.  He had been maintained on Eliquis  given anterior akinesis on previous echocardiograms.  Echocardiogram in May demonstrated no wall motion abnormalities.  His Eliquis  was stopped and he was converted to Plavix .  In June of this year his craniotomy plates had eroded through his skin and these were removed.  He was treated with antibiotics postoperatively.     Current Medications: No outpatient medications have been marked as taking for the 08/15/24 encounter (Appointment) with Adaora Mchaney K, MD.     Review of Systems:   Please see the history of present illness.    All other systems reviewed and are negative.     EKGs/Labs/Other Test Reviewed:   EKG: 2024 normal sinus rhythm anterior infarction pattern  EKG Interpretation Date/Time:    Ventricular Rate:    PR Interval:    QRS Duration:    QT Interval:    QTC Calculation:   R Axis:  Text Interpretation:          CARDIAC STUDIES: Refer to CV Procedures and Imaging Tabs   Risk Assessment/Calculations:   {Does this patient have ATRIAL FIBRILLATION?:[id number]}      Physical Exam:   VS:  There were no vitals taken for this visit.   No BP recorded.  {Refresh Note OR Click here to enter BP  :1}***   Wt Readings from Last 3 Encounters:  05/22/24 147 lb (66.7 kg)   04/29/24 146 lb 9.6 oz (66.5 kg)  04/18/24 149 lb (67.6 kg)      GENERAL:  No apparent distress, AOx3 HEENT:  No carotid bruits, +2 carotid impulses, no scleral icterus CAR: RRR Irregular RR*** no murmurs***, gallops, rubs, or thrills RES:  Clear to auscultation bilaterally ABD:  Soft, nontender, nondistended, positive bowel sounds x 4 VASC:  +2 radial pulses, +2 carotid pulses NEURO:  CN 2-12 grossly intact; motor and sensory grossly intact PSYCH:  No active depression or anxiety EXT:  No edema, ecchymosis, or cyanosis  Signed, Jong Rickman K Kutter Schnepf, MD  08/02/2024 12:52 PM    [facility] [address] [place], [place], [place]  [address] Phone: [phone]; Fax: [phone]   Note:  This document was prepared using Dragon voice recognition software and may include unintentional dictation errors.

## 2024-08-13 ENCOUNTER — Ambulatory Visit (INDEPENDENT_AMBULATORY_CARE_PROVIDER_SITE_OTHER): Admitting: Family Medicine

## 2024-08-13 VITALS — BP 117/70 | HR 68 | Ht 67.0 in | Wt 145.7 lb

## 2024-08-13 DIAGNOSIS — E119 Type 2 diabetes mellitus without complications: Secondary | ICD-10-CM

## 2024-08-13 DIAGNOSIS — S20212A Contusion of left front wall of thorax, initial encounter: Secondary | ICD-10-CM

## 2024-08-13 DIAGNOSIS — W19XXXA Unspecified fall, initial encounter: Secondary | ICD-10-CM

## 2024-08-13 DIAGNOSIS — Z23 Encounter for immunization: Secondary | ICD-10-CM

## 2024-08-13 LAB — POCT GLYCOSYLATED HEMOGLOBIN (HGB A1C): Hemoglobin A1C: 5.6 % (ref 4.0–5.6)

## 2024-08-13 MED ORDER — TRAMADOL HCL 50 MG PO TABS: 50.0000 mg | ORAL_TABLET | Freq: Three times a day (TID) | ORAL | 0 refills | Status: AC | PRN

## 2024-08-13 NOTE — Patient Instructions (Addendum)
 It was great to see you! Thank you for allowing me to participate in your care!  Our plans for today:  - You bruised your ribs. - Your pain will slowly get better over the next 6-8 weeks. - You may continue lidocaine  patches and Tylenol  as needed for pain. - Please return if you have any difficulty breathing or your pain suddenly worsens   Please arrive 15 minutes PRIOR to your next scheduled appointment time! If you do not, this affects OTHER patients' care.  Take care and seek immediate care sooner if you develop any concerns.   Ozell Provencal, MD, PGY-3 [facility] 10:26 AM 08/13/2024  [facility]

## 2024-08-13 NOTE — Progress Notes (Signed)
    SUBJECTIVE:   CHIEF COMPLAINT / HPI: fall   Fall -On plavix  -Occurred 8 days ago -Was gathering branches outside at home -Accidentally tripped over branch -Was trying to protect head -Tried lidocaine  patch which did not help -Worse with lying on the side -Initially had pain with breathing in -Movements hurts -Did not hit head -Did not loose consciousness -Does not have pain anywhere else -Denies balance issues, no other falls, declines PT  PERTINENT  PMH / PSH: Hx of NSTEMI, Ischemic Cardiomyopathy, T2DM, s/p Craniotomy with placement of cranial plates 7986 s/p removal of hardware (06/25)  OBJECTIVE:   BP 117/70   Pulse 68   Ht 5' 7 (1.702 m)   Wt 145 lb 11.2 oz (66.1 kg)   SpO2 97%   BMI 22.82 kg/m   General: NAD, well appearing Neuro: A&O Cardiovascular: RRR, no murmurs, tenderness to palpation along anterior and posterior 2-5th ribs on left side Respiratory: normal WOB on RA, CTAB, no wheezes, ronchi or rales Extremities: Moving all 4 extremities equally   ASSESSMENT/PLAN:   Assessment & Plan Fall, initial encounter Rib contusion, left, initial encounter Suspect left 2-5th ribs possible contusion secondary to fall.  Doubt acute fracture, no significant point tenderness on exam or obvious bruising.  Reassuringly normal respiratory exam.  Discussed conservative management with patient, and normal expected course for pain. - Tramadol  50 mg every 8 hours as needed for 5 days - Continue Tylenol , lidocaine  patches - Follow-up as needed if not improving in 6 weeks - Offered rib x-ray, patient declined Type 2 diabetes mellitus without complication, without long-term current use of insulin  (HCC) A1c collected today.  Shoulder 5.6.  Recommend follow-up for diabetes management in 1 month. Encounter for immunization Flu shot administered today  Return in about 1 month (around 09/12/2024) for T2DM f/u.  Ozell Provencal, MD [facility]

## 2024-08-13 NOTE — Assessment & Plan Note (Signed)
 A1c collected today.  Shoulder 5.6.  Recommend follow-up for diabetes management in 1 month.

## 2024-08-15 ENCOUNTER — Ambulatory Visit: Admitting: Internal Medicine

## 2024-08-15 DIAGNOSIS — E1169 Type 2 diabetes mellitus with other specified complication: Secondary | ICD-10-CM

## 2024-08-15 DIAGNOSIS — I255 Ischemic cardiomyopathy: Secondary | ICD-10-CM

## 2024-08-15 DIAGNOSIS — I152 Hypertension secondary to endocrine disorders: Secondary | ICD-10-CM

## 2024-08-15 DIAGNOSIS — E119 Type 2 diabetes mellitus without complications: Secondary | ICD-10-CM

## 2024-08-15 DIAGNOSIS — I249 Acute ischemic heart disease, unspecified: Secondary | ICD-10-CM

## 2024-08-15 DIAGNOSIS — I700 Atherosclerosis of aorta: Secondary | ICD-10-CM

## 2024-08-21 LAB — MICROALBUMIN / CREATININE URINE RATIO

## 2024-08-27 ENCOUNTER — Telehealth: Payer: Self-pay

## 2024-08-27 NOTE — Telephone Encounter (Signed)
 Received call from [facility] in regards to Urine Microalbumin Creatinine.  She reports a lab processing error and the sample will need to re recollected.   Will forward to PCP to see if this can be done at next office visit.   If sooner, please place a future order.

## 2024-08-28 ENCOUNTER — Other Ambulatory Visit: Payer: Self-pay | Admitting: Student

## 2024-09-04 NOTE — Progress Notes (Signed)
 BRAZOS SANDOVAL                                          MRN: [id number]   09/04/2024   The VBCI Quality Team Specialist reviewed this patient medical record for the purposes of chart review for care gap closure. The following were reviewed: abstraction for care gap closure-kidney health evaluation for diabetes:eGFR  and uACR.    VBCI Quality Team

## 2024-09-11 DIAGNOSIS — T84218D Breakdown (mechanical) of internal fixation device of other bones, subsequent encounter: Secondary | ICD-10-CM | POA: Diagnosis not present

## 2024-09-13 ENCOUNTER — Ambulatory Visit: Admitting: Student

## 2024-10-01 NOTE — Progress Notes (Unsigned)
 Office Visit    Patient Name: Frederick Knight Date of Encounter: 10/02/2024  PCP:  Howell Lunger, DO   [facility]  Cardiologist:  Arun K Thukkani, MD  Advanced Practice Provider:  No care team member to display Electrophysiologist:  None   HPI    Frederick Knight is a 73 y.o. male with a past medical history of type 2 diabetes mellitus, hyperlipidemia, GERD, BPH presents today for hospital follow-up.  Patient presented to the ED with acute onset of chest pain.  Hemodynamically stable on admission.  In the ED, labs significant for troponin 2304 >>> 5049, which trended as high as 17,821.  Started on heparin , nitro, aspirin  and high intensity statin day of admission.  LHC on 4/16 showed 100% stenosis of mid LAD which was stented, 85% stenosis of RCA +80% stenosis of the diagonal artery.  Underwent second [facility] on 4/17 with stent placed to RCA.  Cardiology had recommended medical management of the diagonal lesion.  Antiplatelet and anticoagulation plan was aspirin  plus Plavix  plus Eliquis  x 1 month then Plavix  and Eliquis  alone until anterior akinesis improves.  If AKI improves, add back aspirin  and complete DAPT for 1 year.  After 1 year of DAPT transition to Plavix  monotherapy indefinitely.  Lipid panel at goal and continue atorvastatin  80 mg at discharge.  Chest pain resolved patient was discharged 03/30/2023 in stable condition.  Patient also had a diagnosis of ischemic cardiomyopathy with LVEF 45 to 50%, LV mild decreased function with regional wall motion abnormalities and diastolic parameters consistent with grade 1 DD.  Started on metoprolol  succinate, Farxiga , and losartan  for GDMT.  He was last seen 4/24, he tells me that he is doing well with his new medications.  Tolerating Eliquis  5 mg twice a day as well as Farxiga  and metoprolol .  He is worried that his blood pressure has been low normal.  We discussed decreasing his losartan .  Otherwise plan for follow-up  echocardiogram.  Some left arm pain but he thinks it could be musculoskeletal since he lays on that side at night.  We encourage participation in cardiac rehab and sent a referral today.  He saw me 07/14/23, he is doing well. His BP is low normal today. He has some dizziness in the mornings and have been getting some low Bps at home.  Otherwise, doing okay.  He has 3 granddaughters that he enjoys spending time with.  Tolerating Plavix  and Eliquis , off aspirin .  We discussed supplements that are okay to take with his heart medications.  Advised not to take vitamin A since it is a blood thinner.  We asked him to keep track of his blood pressure with this new change for the next couple of weeks.  Reports no shortness of breath nor dyspnea on exertion. Reports no chest pain, pressure, or tightness. No edema, orthopnea, PND. Reports no palpitations.   He was seen by me February 2025, he presents with a history of hypertension and hyperlipidemia for a routine follow-up. He reports no issues with his current medications. He was previously on losartan  for hypertension, but it was discontinued due to concerns of hypotension. Since discontinuation, he reports feeling better and his blood pressure readings have been within normal limits. He is currently on metoprolol  25mg  daily, Lipitor 80mg  daily, Eliquis  twice daily, and Jardiance . He reports no issues with swelling or shortness of breath and maintains an active lifestyle, going for walks ten times a day. He has a family history  of medical professionals, with relatives working in various medical fields across the country.  Reports no shortness of breath nor dyspnea on exertion. Reports no chest pain, pressure, or tightness. No edema, orthopnea, PND. Reports no palpitations.   Discussed the use of AI scribe software for clinical note transcription with the patient, who gave verbal consent to proceed.  Today, he presents for a cardiovascular follow-up.  He has not  experienced any new cardiovascular issues since his last visit. Echocardiogram reviewed and EKG performed today. Heart rate at home ranges from 60 to 70 bpm at rest and increases to 128 bpm during fast walking. No chest pain, shortness of breath, or peripheral edema since the last visit. He continues daily morning walks.  Recent blood and urine tests show a hemoglobin A1c of 5.6. He maintains a good diet, eating well 90% of the time.  Reports no shortness of breath nor dyspnea on exertion. Reports no chest pain, pressure, or tightness. No edema, orthopnea, PND. Reports no palpitations.   Discussed the use of AI scribe software for clinical note transcription with the patient, who gave verbal consent to proceed.   Past Medical History    Past Medical History:  Diagnosis Date   Diabetes mellitus without complication (HCC)    Hyperlipemia    Controlled well with medications   Past Surgical History:  Procedure Laterality Date   COLONOSCOPY N/A 01/15/2015   RMR:  Multiple polyps removed as described above.   COLONOSCOPY  01/28/2021   [facility]   COLONOSCOPY  2016   CORONARY STENT INTERVENTION N/A 03/29/2023   Procedure: CORONARY STENT INTERVENTION;  Surgeon: Swaziland, Peter M, MD;  Location[facility] INVASIVE CV LAB;  Service: Cardiovascular;  Laterality: N/A;   CORONARY STENT INTERVENTION N/A 03/28/2023   Procedure: CORONARY STENT INTERVENTION;  Surgeon: Burnard Debby LABOR, MD;  Location: [facility] INVASIVE CV LAB;  Service: Cardiovascular;  Laterality: N/A;   CRANIOTOMY  05/16/2012   Procedure: CRANIOTOMY HEMATOMA EVACUATION SUBDURAL;  Surgeon: Arley SHAUNNA Helling, MD;  Location: [facility] NEURO ORS;  Service: Neurosurgery;  Laterality: N/A;  Right burr holes for evacuation of subdural hematoma   CRANIOTOMY  05/18/2012   Procedure: CRANIOTOMY HEMATOMA EVACUATION SUBDURAL;  Surgeon: Arley SHAUNNA Helling, MD;  Location: [facility] NEURO ORS;  Service: Neurosurgery;  Laterality: Right;   LEFT HEART CATH AND CORONARY ANGIOGRAPHY N/A  03/28/2023   Procedure: LEFT HEART CATH AND CORONARY ANGIOGRAPHY;  Surgeon: Burnard Debby LABOR, MD;  Location: [facility] INVASIVE CV LAB;  Service: Cardiovascular;  Laterality: N/A;    Allergies  No Known Allergies   EKGs/Labs/Other Studies Reviewed:   The following studies were reviewed today: Cardiac Studies & Procedures   ______________________________________________________________________________________________ CARDIAC CATHETERIZATION  CARDIAC CATHETERIZATION 03/29/2023  Conclusion   Dist RCA lesion is 85% stenosed.   A drug-eluting stent was successfully placed using a SYNERGY XD 4.0X12.   Post intervention, there is a 0% residual stenosis.  Successful PCI of the distal RCA with DES  Plan; DAPT for one year. Anticipate DC tomorrow.  Findings Coronary Findings Diagnostic  Dominance: Right  Left Anterior Descending Non-stenotic Mid LAD lesion was previously treated. Non-stenotic Mid LAD to Dist LAD lesion.  First Diagonal Branch Vessel is small in size. 1st Diag lesion is 80% stenosed.  Second Diagonal Branch Vessel is small in size.  Ramus Intermedius Ramus lesion is 20% stenosed.  Left Circumflex Vessel is small.  Right Coronary Artery Vessel is large. The vessel exhibits minimal luminal irregularities. Dist RCA lesion is 85% stenosed.  Right Posterior Descending Artery RPDA lesion is 40% stenosed.  Second Right Posterolateral Branch 2nd RPL lesion is 20% stenosed.  Intervention  Dist RCA lesion Stent CATHETER LAUNCHER 6FR AL1 guide catheter was inserted. Lesion crossed with guidewire using a WIRE ASAHI PROWATER 180CM. Pre-stent angioplasty was performed using a BALLN EMERGE MR 2.5X12. A drug-eluting stent was successfully placed using a SYNERGY XD 4.0X12. Stent strut is well apposed. Post-stent angioplasty was performed using a BALL SAPPHIRE NC24 4.5X10. Maximum pressure:  16 atm. Post-Intervention Lesion Assessment The intervention was successful.  Pre-interventional TIMI flow is 3. Post-intervention TIMI flow is 3. No complications occurred at this lesion. There is a 0% residual stenosis post intervention.   CARDIAC CATHETERIZATION 03/28/2023  Conclusion   Dist RCA lesion is 85% stenosed.   RPDA lesion is 40% stenosed.   Mid LAD lesion is 100% stenosed.   2nd RPL lesion is 20% stenosed.   Ramus lesion is 20% stenosed.   1st Diag lesion is 80% stenosed.   Non-stenotic Mid LAD to Dist LAD lesion.   A drug-eluting stent was successfully placed.   Post intervention, there is a 0% residual stenosis.   The left ventricular ejection fraction is 50-55% by visual estimate.  Acute coronary syndrome secondary to total LAD occlusion after the first diagonal vessel with 80% stenosis in a small caliber first diagonal vessel proximal to the total occlusion.  Mild 20% stenosis in the ramus intermediate vessel.  Small normal left circumflex vessel.  Large dominant RCA with mild luminal irregularity and focal 85% stenosis proximal to the PDA takeoff with 40% mid PDA stenosis and 20% PL stenosis.  Mild LV dysfunction with mid distal anterolateral and apical hypocontractility.  EF estimate approximately 50%.  LVEDP 22 mm.  Successful PCI to the total LAD occlusion with ultimate insertion of a 3.0 x 15 mm Onyx frontier DES stent postdilated to 3.25 mm with 100% occlusion being reduced to 0%.  TIMI 0 flow was improved to TIMI-3 flow.  There is evidence for significant systolic bridging of the mid LAD proximal to the mid diagonal vessel.  RECOMMENDATION: DAPT with aspirin /Brilinta  for minimum of 1 year.  Will hydrate postprocedure.  Plan for staged PCI to his distal RCA which is a large-caliber vessel.  Medical therapy versus PCI to the diagonal vessel.  Findings Coronary Findings Diagnostic  Dominance: Right  Left Anterior Descending Mid LAD lesion is 100% stenosed. Non-stenotic Mid LAD to Dist LAD lesion.  First Diagonal Branch Vessel is  small in size. 1st Diag lesion is 80% stenosed.  Second Diagonal Branch Vessel is small in size.  Ramus Intermedius Ramus lesion is 20% stenosed.  Left Circumflex Vessel is small.  Right Coronary Artery Vessel is large. The vessel exhibits minimal luminal irregularities. Dist RCA lesion is 85% stenosed.  Right Posterior Descending Artery RPDA lesion is 40% stenosed.  Second Right Posterolateral Branch 2nd RPL lesion is 20% stenosed.  Intervention  Mid LAD lesion Stent A drug-eluting stent was successfully placed. Stent strut is well apposed. Post-Intervention Lesion Assessment The intervention was successful. Pre-interventional TIMI flow is 0. Post-intervention TIMI flow is 3. No complications occurred at this lesion. There is a 0% residual stenosis post intervention.     ECHOCARDIOGRAM  ECHOCARDIOGRAM COMPLETE 04/30/2024  Narrative ECHOCARDIOGRAM REPORT    Patient Name:   Frederick Knight Date of Exam: 04/30/2024 Medical Rec #:  [id number]        Height:       66.0 in Accession #:  [id number]       Weight:       146.6 lb Date of Birth:  [birth year]        BSA:          1.752 m Patient Age:    73 years         BP:           128/71 mmHg Patient Gender: M                HR:           75 bpm. Exam Location:  [address]  Procedure: 2D Echo, Cardiac Doppler, Color Doppler and Intracardiac Opacification Agent (Both Spectral and Color Flow Doppler were utilized during procedure).  Indications:    I25.5 Ischemic cardiomyopathy  History:        Patient has prior history of Echocardiogram examinations, most recent 05/09/2023. Cardiomyopathy and Ischemic cardiomyopathy, CAD and Previous Myocardial Infarction, Stent; Risk Factors:Diabetes and Dyslipidemia. Previous echo revealed LVEF 65%.  Sonographer:    Nolon Berg BA, RDCS Referring Phys: 92 Jocee Kissick N Wilder Amodei  IMPRESSIONS   1. Left ventricular ejection fraction, by estimation, is 55 to 60%. The left  ventricle has normal function. The left ventricle demonstrates regional wall motion abnormalities (see scoring diagram/findings for description). Left ventricular diastolic parameters are indeterminate. 2. Right ventricular systolic function is normal. The right ventricular size is normal. 3. The mitral valve is normal in structure. Trivial mitral valve regurgitation. No evidence of mitral stenosis. 4. The aortic valve is normal in structure. Aortic valve regurgitation is not visualized. No aortic stenosis is present. 5. There is borderline dilatation of the ascending aorta, measuring 36 mm. 6. The inferior vena cava is normal in size with greater than 50% respiratory variability, suggesting right atrial pressure of 3 mmHg.  FINDINGS Left Ventricle: Left ventricular ejection fraction, by estimation, is 55 to 60%. The left ventricle has normal function. The left ventricle demonstrates regional wall motion abnormalities. Definity  contrast agent was given IV to delineate the left ventricular endocardial borders. The left ventricular internal cavity size was normal in size. There is no left ventricular hypertrophy. Left ventricular diastolic parameters are indeterminate.   LV Wall Scoring: The apical lateral segment, apical anterior segment, and apex are hypokinetic. The anterior wall, antero-lateral wall, entire septum, inferior wall, and posterior wall are normal.  Right Ventricle: The right ventricular size is normal. No increase in right ventricular wall thickness. Right ventricular systolic function is normal.  Left Atrium: Left atrial size was normal in size.  Right Atrium: Right atrial size was normal in size.  Pericardium: There is no evidence of pericardial effusion. Presence of epicardial fat layer.  Mitral Valve: The mitral valve is normal in structure. Trivial mitral valve regurgitation. No evidence of mitral valve stenosis.  Tricuspid Valve: The tricuspid valve is normal in  structure. Tricuspid valve regurgitation is not demonstrated. No evidence of tricuspid stenosis.  Aortic Valve: The aortic valve is normal in structure. Aortic valve regurgitation is not visualized. No aortic stenosis is present.  Pulmonic Valve: The pulmonic valve was normal in structure. Pulmonic valve regurgitation is not visualized. No evidence of pulmonic stenosis.  Aorta: There is borderline dilatation of the ascending aorta, measuring 36 mm.  Venous: The inferior vena cava is normal in size with greater than 50% respiratory variability, suggesting right atrial pressure of 3 mmHg.  IAS/Shunts: No atrial level shunt detected by color flow Doppler.   LEFT VENTRICLE PLAX 2D LVIDd:  4.00 cm   Diastology LVIDs:         2.50 cm   LV e' medial:    6.96 cm/s LV PW:         1.00 cm   LV E/e' medial:  10.8 LV IVS:        0.90 cm   LV e' lateral:   7.07 cm/s LVOT diam:     1.90 cm   LV E/e' lateral: 10.6 LV SV:         54 LV SV Index:   31 LVOT Area:     2.84 cm   RIGHT VENTRICLE             IVC RV Basal diam:  2.90 cm     IVC diam: 1.50 cm RV Mid diam:    2.30 cm RV S prime:     11.50 cm/s TAPSE (M-mode): 1.9 cm RVSP:           17.9 mmHg  LEFT ATRIUM             Index        RIGHT ATRIUM           Index LA diam:        2.90 cm 1.65 cm/m   RA Pressure: 3.00 mmHg LA Vol (A2C):   28.3 ml 16.15 ml/m  RA Area:     13.00 cm LA Vol (A4C):   18.0 ml 10.27 ml/m  RA Volume:   29.80 ml  17.00 ml/m LA Biplane Vol: 24.2 ml 13.81 ml/m AORTIC VALVE LVOT Vmax:   90.10 cm/s LVOT Vmean:  59.700 cm/s LVOT VTI:    0.192 m  AORTA Ao Root diam: 3.10 cm Ao Asc diam:  3.60 cm  MITRAL VALVE                TRICUSPID VALVE MV Area (PHT): 3.77 cm     TR Peak grad:   14.9 mmHg MV Decel Time: 201 msec     TR Vmax:        193.00 cm/s MV E velocity: 74.90 cm/s   Estimated RAP:  3.00 mmHg MV A velocity: 104.00 cm/s  RVSP:           17.9 mmHg MV E/A ratio:  0.72 SHUNTS Systemic VTI:   0.19 m Systemic Diam: 1.90 cm  Kardie Tobb DO Electronically signed by Dub Huntsman DO Signature Date/Time: 04/30/2024/3:35:38 PM    Final          ______________________________________________________________________________________________       EKG:  EKG is not ordered today.   Recent Labs: No results found for requested labs within last 365 days.  Recent Lipid Panel    Component Value Date/Time   CHOL 114 03/29/2023 0104   CHOL 151 01/04/2022 1133   TRIG 79 03/29/2023 0104   HDL 45 03/29/2023 0104   HDL 44 01/04/2022 1133   CHOLHDL 2.5 03/29/2023 0104   VLDL 16 03/29/2023 0104   LDLCALC 53 03/29/2023 0104   LDLCALC 84 01/04/2022 1133    Home Medications   Current Meds  Medication Sig   acetaminophen  (TYLENOL ) 325 MG tablet Take 2 tablets (650 mg total) by mouth every 4 (four) hours as needed for headache or mild pain.   atorvastatin  (LIPITOR) 80 MG tablet TAKE 1 TABLET BY MOUTH EVERY DAY   clopidogrel  (PLAVIX ) 75 MG tablet TAKE 1 TABLET BY MOUTH EVERY DAY   empagliflozin  (JARDIANCE ) 10 MG TABS tablet Take 1 tablet (  10 mg total) by mouth daily.   metFORMIN  (GLUCOPHAGE -XR) 500 MG 24 hr tablet TAKE 1 TABLET BY MOUTH EVERY DAY WITH BREAKFAST   metoprolol  succinate (TOPROL  XL) 25 MG 24 hr tablet Take 1 tablet (25 mg total) by mouth daily.   oxybutynin (DITROPAN-XL) 5 MG 24 hr tablet Take 5 mg by mouth daily.   tamsulosin  (FLOMAX ) 0.4 MG CAPS capsule TAKE 1 CAPSULE BY MOUTH EVERY DAY     Review of Systems      All other systems reviewed and are otherwise negative except as noted above.  Physical Exam    VS:  BP 108/68 (BP Location: Left Arm, Patient Position: Sitting, Cuff Size: Normal)   Ht 5' 7 (1.702 m)   Wt 148 lb (67.1 kg)   BMI 23.18 kg/m  , BMI Body mass index is 23.18 kg/m.  Wt Readings from Last 3 Encounters:  10/02/24 148 lb (67.1 kg)  08/13/24 145 lb 11.2 oz (66.1 kg)  05/22/24 147 lb (66.7 kg)     GEN: Well nourished, well developed, in  no acute distress. HEENT: normal. Neck: Supple, no JVD, carotid bruits, or masses. Cardiac: RRR, no murmurs, rubs, or gallops. No clubbing, cyanosis, edema.  Radials/PT 2+ and equal bilaterally.  Respiratory:  Respirations regular and unlabored, clear to auscultation bilaterally. GI: Soft, nontender, nondistended. MS: No deformity or atrophy. Skin: Warm and dry, no rash. Neuro:  Strength and sensation are intact. Psych: Normal affect. Some ecchymosis from cath site, no hematoma  Assessment & Plan     Ischemic cardiomyopathy Well-managed with no new symptoms. Echocardiogram shows good cardiac function. EKG normal. Blood pressure controlled. - Continue clopidogrel  (Plavix ). - Discontinue Eliquis .  Non-ST elevation myocardial infarction (NSTEMI) Stable cardiac status with no new events. Regular monitoring shows stable condition. - Continue current cardiac medications. -continue plavix  75mg  daily  Essential hypertension Well controlled with current medication regimen. Blood pressure within target range.  Type 2 diabetes mellitus, in remission In remission with diet control. Hemoglobin A1c is 5.6. - Discuss with primary care physician about discontinuing metformin  if A1c remains stable. - Request lipid panel and repeat A1c at next primary care visit.  Hyperlipidemia -Continue Atorvastatin  80mg  by mouth daily -will need follow-up lipid panel at his PCP office    Disposition: Follow up 6 months with Arun K Thukkani, MD or APP.  Signed, Orren LOISE Fabry, PA-C 10/02/2024, 2:07 PM [facility]

## 2024-10-02 ENCOUNTER — Encounter: Payer: Self-pay | Admitting: Physician Assistant

## 2024-10-02 ENCOUNTER — Ambulatory Visit: Attending: Cardiology | Admitting: Physician Assistant

## 2024-10-02 VITALS — BP 108/68 | Ht 67.0 in | Wt 148.0 lb

## 2024-10-02 DIAGNOSIS — I214 Non-ST elevation (NSTEMI) myocardial infarction: Secondary | ICD-10-CM

## 2024-10-02 DIAGNOSIS — I10 Essential (primary) hypertension: Secondary | ICD-10-CM

## 2024-10-02 DIAGNOSIS — E785 Hyperlipidemia, unspecified: Secondary | ICD-10-CM | POA: Diagnosis not present

## 2024-10-02 DIAGNOSIS — I255 Ischemic cardiomyopathy: Secondary | ICD-10-CM

## 2024-10-02 MED ORDER — METOPROLOL SUCCINATE ER 25 MG PO TB24: 25.0000 mg | ORAL_TABLET | Freq: Every day | ORAL | 2 refills | Status: AC

## 2024-10-02 MED ORDER — EMPAGLIFLOZIN 10 MG PO TABS: 10.0000 mg | ORAL_TABLET | Freq: Every day | ORAL | 3 refills | Status: AC

## 2024-10-02 MED ORDER — CLOPIDOGREL BISULFATE 75 MG PO TABS: 75.0000 mg | ORAL_TABLET | Freq: Every day | ORAL | 3 refills | Status: AC

## 2024-10-02 MED ORDER — ATORVASTATIN CALCIUM 80 MG PO TABS: 80.0000 mg | ORAL_TABLET | Freq: Every day | ORAL | 3 refills | Status: AC

## 2024-10-02 NOTE — Patient Instructions (Signed)
 Medication Instructions:  Your physician recommends that you continue on your current medications as directed. Please refer to the Current Medication list given to you today.   *If you need a refill on your cardiac medications before your next appointment, please call your pharmacy*  Lab Work: None Ordered today. Consult with your Primary Care Provider about having a Lipid Panel and Hemoglobin A1C ordered If you have labs (blood work) drawn today and your tests are completely normal, you will receive your results only by: MyChart Message (if you have MyChart) OR A paper copy in the mail If you have any lab test that is abnormal or we need to change your treatment, we will call you to review the results.  Testing/Procedures: None Ordered  Follow-Up: At [facility], you and your health needs are our priority.  As part of our continuing mission to provide you with exceptional heart care, our providers are all part of one team.  This team includes your primary Cardiologist (physician) and Advanced Practice Providers or APPs (Physician Assistants and Nurse Practitioners) who all work together to provide you with the care you need, when you need it.  Your next appointment:   1 year(s)  Provider:   Orren Fabry, PA-C          We recommend signing up for the patient portal called MyChart.  Sign up information is provided on this After Visit Summary.  MyChart is used to connect with patients for Virtual Visits (Telemedicine).  Patients are able to view lab/test results, encounter notes, upcoming appointments, etc.  Non-urgent messages can be sent to your provider as well.   To learn more about what you can do with MyChart, go to [email or web address].

## 2024-10-04 ENCOUNTER — Telehealth: Payer: Self-pay

## 2024-10-04 ENCOUNTER — Ambulatory Visit (INDEPENDENT_AMBULATORY_CARE_PROVIDER_SITE_OTHER): Admitting: Student

## 2024-10-04 ENCOUNTER — Encounter: Payer: Self-pay | Admitting: Student

## 2024-10-04 VITALS — BP 117/73 | HR 60 | Ht 67.0 in | Wt 147.4 lb

## 2024-10-04 DIAGNOSIS — E119 Type 2 diabetes mellitus without complications: Secondary | ICD-10-CM | POA: Diagnosis not present

## 2024-10-04 DIAGNOSIS — I255 Ischemic cardiomyopathy: Secondary | ICD-10-CM | POA: Diagnosis not present

## 2024-10-04 DIAGNOSIS — Z1211 Encounter for screening for malignant neoplasm of colon: Secondary | ICD-10-CM

## 2024-10-04 NOTE — Assessment & Plan Note (Signed)
 Well-controlled.  Using shared decision making, will come off metformin . - Continue Jardiance  - Follow-up in December for repeat A1c, if remains stable consider long-term discontinuation of metformin 

## 2024-10-04 NOTE — Telephone Encounter (Signed)
 2nd attempt to reach patient to reschedule appt today, due to not having [facility] coverage. No answer LVM. Nelson Land, CMA

## 2024-10-04 NOTE — Progress Notes (Signed)
    SUBJECTIVE:   CHIEF COMPLAINT / HPI:   Type 2 diabetes Patient presents today to discuss repeat A1c and can potentially come off metformin .  He has 2 A1c's of less than 6.5 in the past year.  Too soon to recheck A1c, last A1c last month.  Discussed the fact that he is on Jardiance , with well-controlled A1c it is reasonable to stop metformin  if he truly wants to.  Discussed continued cardiovascular benefits of metformin .  After shared decision making, patient like to stop metformin .  History of ischemic cardiomyopathy  NSTEMI Request to repeat lipid panel by cardiology.  Happy to do this, unfortunate lab is closed today.  Patient will take order to [facility] and have it completed there.  Lower urinary tract symptoms  history of hematuria Follows with urology.  Taking Flomax  and oxybutynin.  Discussed Flomax  helps empty bladder, and oxybutynin helps with nocturia symptoms.  Patient questions if he needs to stay on oxybutynin.  Informed patient that it is okay if he stops taking it, but it may worsen his frequency.  I recommend the patient follow-up with his urologist.   OBJECTIVE:   BP 117/73   Pulse 60   Ht 5' 7 (1.702 m)   Wt 147 lb 6 oz (66.8 kg)   SpO2 98%   BMI 23.08 kg/m    General: NAD, pleasant Cardio: RRR, no MRG. Cap Refill <2s. Respiratory: CTAB, normal wob on RA Skin: Warm and dry  ASSESSMENT/PLAN:   Assessment & Plan Type 2 diabetes mellitus without complication, without long-term current use of insulin  (HCC) Well-controlled.  Using shared decision making, will come off metformin . - Continue Jardiance  - Follow-up in December for repeat A1c, if remains stable consider long-term discontinuation of metformin  Ischemic cardiomyopathy -Lipid panel ordered, patient will have this completed at [facility] Screening for colon cancer Due for colonoscopy, history of polyps-recommendation was for follow-up in 2 to 3 years.  Unfortunately after sustaining NSTEMI with  ischemic cardiomyopathy, cardiology had recommended at least 6 months before repeating.  At this point is been over a year, and he is interested in pursuing a colonoscopy. - Colonoscopy ordered  Gladis Church, DO [facility]

## 2024-10-04 NOTE — Patient Instructions (Signed)
 It was great to see you! Thank you for allowing me to participate in your care!   I recommend that you always bring your medications to each appointment as this makes it easy to ensure we are on the correct medications and helps us  not miss when refills are needed.  Our plans for today:  - You can stop taking metformin  at this time - Follow-up with us  in December for A1c recheck - Please go to [facility] to have your lipid panel drawn -We are checking some labs today, I will call you if they are abnormal will send you a MyChart message or a letter if they are normal.  If you do not hear about your labs in the next 2 weeks please let us  know. - Please call and follow-up with your urologist  Take care and seek immediate care sooner if you develop any concerns. Please remember to show up 15 minutes before your scheduled appointment time!  Gladis Church, DO [facility]

## 2024-10-04 NOTE — Assessment & Plan Note (Signed)
-  Lipid panel ordered, patient will have this completed at [facility]

## 2024-10-05 LAB — LIPID PANEL
Chol/HDL Ratio: 2.9 ratio (ref 0.0–5.0)
Cholesterol, Total: 126 mg/dL (ref 100–199)
HDL: 44 mg/dL (ref >39)
LDL Chol Calc (NIH): 66 mg/dL (ref 0–99)
Triglycerides: 79 mg/dL (ref 0–149)
VLDL Cholesterol Cal: 16 mg/dL (ref 5–40)

## 2024-10-05 LAB — MICROALBUMIN / CREATININE URINE RATIO
Creatinine, Urine: 98.3 mg/dL
Microalb/Creat Ratio: 5 mg/g{creat} (ref 0–29)
Microalbumin, Urine: 5.4 ug/mL

## 2024-10-07 ENCOUNTER — Ambulatory Visit: Payer: Self-pay | Admitting: Student

## 2024-10-11 ENCOUNTER — Telehealth: Payer: Self-pay

## 2024-10-11 ENCOUNTER — Encounter: Payer: Self-pay | Admitting: Gastroenterology

## 2024-10-11 NOTE — Telephone Encounter (Signed)
 Patient calls nurse line in regards to [facility] referral.   He reports he has not heard anything from anyone.   [facility] office information given to patient.   Patient was appreciative.

## 2024-11-18 ENCOUNTER — Ambulatory Visit: Admitting: Student

## 2024-11-18 VITALS — BP 134/73 | HR 58 | Wt 146.4 lb

## 2024-11-18 DIAGNOSIS — R42 Dizziness and giddiness: Secondary | ICD-10-CM

## 2024-11-18 DIAGNOSIS — E119 Type 2 diabetes mellitus without complications: Secondary | ICD-10-CM

## 2024-11-18 DIAGNOSIS — L989 Disorder of the skin and subcutaneous tissue, unspecified: Secondary | ICD-10-CM

## 2024-11-18 NOTE — Progress Notes (Signed)
    SUBJECTIVE:   CHIEF COMPLAINT / HPI:   T2DM Follow-up for A1c today, and need for GFR lab.  Currently taking Jardiance , 10 mg daily.  Discontinued metformin  given improvement of diabetes, per patient preference using shared decision making despite possible cardiac benefit.  Lesion behind left ear Previous BCC behind left ear, excised 2 years ago. New melanotic appearing lesion on back of ear near mastoid process. Not painful,   Light-headedness No fall, No LOC. Happens when standing from sitting. Quickly resolves. No other triggers, including head position No palpitations, CP Echo 6 months ago showed improved heart function with normal LVEF. Taking Tamsulosin /Oxybutynin. Reports not drinking much liquid   OBJECTIVE:   BP 134/73   Pulse (!) 58   Wt 146 lb 6.4 oz (66.4 kg)   SpO2 100%   BMI 22.93 kg/m    General: NAD, pleasant Cardio: RRR, no MRG. Cap Refill <2s. Respiratory: CTAB, normal wob on RA Skin: Warm and dry  Orthostatics: Lying: 126/63 Sitting: 134/81 Standing: 139/87  ASSESSMENT/PLAN:   Assessment & Plan Type 2 diabetes mellitus without complication, without long-term current use of insulin  (HCC) - A1c today - BMP - If A1c remains stable, continue with metformin  Skin lesion -Prior basal cell, concern for possible melanoma given asymmetry, abnormal pigmentation, and skin breakdown of lesion. - Referral to dermatology Postural dizziness with presyncope - BMP today - Orthostatic vitals negative - Slightly low heart rate, however on 25 mg Toprol  daily.  Consider dose reduction if continued dizziness with standing. - Could also consider oxybutynin reduction - If dizziness continues despite hydration and getting up slowly, would likely recommend repeat echocardiogram and obtaining EKG in office.  Heart sounded normal rhythm today.   Addendum: A1C was not drawn. Will message patient.  Gladis Church, DO [facility]

## 2024-11-18 NOTE — Patient Instructions (Signed)
 It was great to see you! Thank you for allowing me to participate in your care!   I recommend that you always bring your medications to each appointment as this makes it easy to ensure we are on the correct medications and helps us  not miss when refills are needed.  Our plans for today:  - Follow-up in 2 weeks if episodes of dizziness are not improving - We are checking some labs today, I will call you if they are abnormal will send you a MyChart message or a letter if they are normal.  If you do not hear about your labs in the next 2 weeks please let us  know.  Take care and seek immediate care sooner if you develop any concerns. Please remember to show up 15 minutes before your scheduled appointment time!  Gladis Church, DO [facility]

## 2024-11-18 NOTE — Assessment & Plan Note (Signed)
-   A1c today - BMP - If A1c remains stable, continue with metformin

## 2024-11-19 ENCOUNTER — Ambulatory Visit: Payer: Self-pay | Admitting: Student

## 2024-11-19 LAB — BASIC METABOLIC PANEL WITH GFR
BUN/Creatinine Ratio: 15 (ref 10–24)
BUN: 15 mg/dL (ref 8–27)
CO2: 24 mmol/L (ref 20–29)
Calcium: 9.8 mg/dL (ref 8.6–10.2)
Chloride: 102 mmol/L (ref 96–106)
Creatinine, Ser: 0.98 mg/dL (ref 0.76–1.27)
Glucose: 81 mg/dL (ref 70–99)
Potassium: 4.3 mmol/L (ref 3.5–5.2)
Sodium: 141 mmol/L (ref 134–144)
eGFR: 81 mL/min/1.73 (ref >59)

## 2024-11-28 ENCOUNTER — Encounter: Payer: Self-pay | Admitting: Gastroenterology

## 2024-11-28 ENCOUNTER — Telehealth: Payer: Self-pay

## 2024-11-28 ENCOUNTER — Ambulatory Visit: Admitting: Gastroenterology

## 2024-11-28 VITALS — BP 118/62 | HR 81 | Ht 67.0 in | Wt 152.0 lb

## 2024-11-28 DIAGNOSIS — I255 Ischemic cardiomyopathy: Secondary | ICD-10-CM | POA: Diagnosis not present

## 2024-11-28 DIAGNOSIS — Z7902 Long term (current) use of antithrombotics/antiplatelets: Secondary | ICD-10-CM

## 2024-11-28 DIAGNOSIS — Z7984 Long term (current) use of oral hypoglycemic drugs: Secondary | ICD-10-CM | POA: Diagnosis not present

## 2024-11-28 DIAGNOSIS — I252 Old myocardial infarction: Secondary | ICD-10-CM | POA: Diagnosis not present

## 2024-11-28 DIAGNOSIS — Z860100 Personal history of colon polyps, unspecified: Secondary | ICD-10-CM

## 2024-11-28 DIAGNOSIS — E119 Type 2 diabetes mellitus without complications: Secondary | ICD-10-CM

## 2024-11-28 DIAGNOSIS — I214 Non-ST elevation (NSTEMI) myocardial infarction: Secondary | ICD-10-CM

## 2024-11-28 MED ORDER — NA SULFATE-K SULFATE-MG SULF 17.5-3.13-1.6 GM/177ML PO SOLN: 1.0000 | ORAL | 0 refills | Status: AC

## 2024-11-28 NOTE — Progress Notes (Signed)
 Chief Complaint: recall colonoscopy Primary GI MD: Dr. Leigh  HPI: Discussed the use of AI scribe software for clinical note transcription with the patient, who gave verbal consent to proceed.  History of Present Illness  73 year old male with medical history as listed below presents for colonoscopy recall  Last colonoscopy February 2022 with hyperplastic polyp and residual seeds noted in cecum, transverse, left colon and a recall of 3 years due to limited visualization.  He is on Plavix  due to history of nonischemic cardiomyopathy and previous NSTEMI.  He is doing well and has no issues today.  No change in bowel habits, nausea, vomiting, weight loss.  Notes he has 15 doctors and his family but unfortunately none of them live here    PREVIOUS GI WORKUP   Colonoscopy 01/2021 - One 3 mm (hyperplastic) polyp in the cecum, removed with a cold snare. Resected and retrieved.  - Residual seeds noted in the cecum, small parts of transverse and left colon.  - Internal hemorrhoids.  - The examination was otherwise normal. - repeat 2 years due to seeds and visualization limitation  Past Medical History:  Diagnosis Date   Diabetes mellitus without complication (HCC)    Hyperlipemia    Controlled well with medications    Past Surgical History:  Procedure Laterality Date   COLONOSCOPY N/A 01/15/2015   RMR:  Multiple polyps removed as described above.   COLONOSCOPY  01/28/2021   Elspeth Leigh [facility]   COLONOSCOPY  2016   CORONARY STENT INTERVENTION N/A 03/29/2023   Procedure: CORONARY STENT INTERVENTION;  Surgeon: Jordan, Peter M, MD;  Location: [facility] INVASIVE CV LAB;  Service: Cardiovascular;  Laterality: N/A;   CORONARY STENT INTERVENTION N/A 03/28/2023   Procedure: CORONARY STENT INTERVENTION;  Surgeon: Burnard Debby LABOR, MD;  Location: [facility] INVASIVE CV LAB;  Service: Cardiovascular;  Laterality: N/A;   CRANIOTOMY  05/16/2012   Procedure: CRANIOTOMY HEMATOMA EVACUATION SUBDURAL;   Surgeon: Arley SHAUNNA Helling, MD;  Location: [facility] NEURO ORS;  Service: Neurosurgery;  Laterality: N/A;  Right burr holes for evacuation of subdural hematoma   CRANIOTOMY  05/18/2012   Procedure: CRANIOTOMY HEMATOMA EVACUATION SUBDURAL;  Surgeon: Arley SHAUNNA Helling, MD;  Location: [facility] NEURO ORS;  Service: Neurosurgery;  Laterality: Right;   LEFT HEART CATH AND CORONARY ANGIOGRAPHY N/A 03/28/2023   Procedure: LEFT HEART CATH AND CORONARY ANGIOGRAPHY;  Surgeon: Burnard Debby LABOR, MD;  Location: [facility] INVASIVE CV LAB;  Service: Cardiovascular;  Laterality: N/A;    Current Outpatient Medications  Medication Sig Dispense Refill   acetaminophen  (TYLENOL ) 325 MG tablet Take 2 tablets (650 mg total) by mouth every 4 (four) hours as needed for headache or mild pain. 30 tablet 1   atorvastatin  (LIPITOR) 80 MG tablet Take 1 tablet (80 mg total) by mouth daily. 90 tablet 3   clopidogrel  (PLAVIX ) 75 MG tablet Take 1 tablet (75 mg total) by mouth daily. 90 tablet 3   empagliflozin  (JARDIANCE ) 10 MG TABS tablet Take 1 tablet (10 mg total) by mouth daily. 90 tablet 3   metoprolol  succinate (TOPROL  XL) 25 MG 24 hr tablet Take 1 tablet (25 mg total) by mouth daily. 90 tablet 2   Na Sulfate-K Sulfate-Mg Sulfate concentrate (SUPREP BOWEL PREP KIT) 17.5-3.13-1.6 GM/177ML SOLN Take 1 kit (354 mLs total) by mouth as directed. 324 mL 0   oxybutynin (DITROPAN-XL) 5 MG 24 hr tablet Take 5 mg by mouth daily.     tamsulosin  (FLOMAX ) 0.4 MG CAPS capsule TAKE 1 CAPSULE BY MOUTH  EVERY DAY 90 capsule 1   metFORMIN  (GLUCOPHAGE -XR) 500 MG 24 hr tablet TAKE 1 TABLET BY MOUTH EVERY DAY WITH BREAKFAST 90 tablet 1   No current facility-administered medications for this visit.    Allergies as of 11/28/2024   (No Known Allergies)    Family History  Problem Relation Age of Onset   Stroke Mother    Colon cancer Neg Hx    Esophageal cancer Neg Hx    Rectal cancer Neg Hx    Stomach cancer Neg Hx    Colon polyps Neg Hx     Social History    Socioeconomic History   Marital status: Divorced    Spouse name: Not on file   Number of children: Not on file   Years of education: Not on file   Highest education level: Not on file  Occupational History   Not on file  Tobacco Use   Smoking status: Never    Passive exposure: Never   Smokeless tobacco: Never  Vaping Use   Vaping status: Never Used  Substance and Sexual Activity   Alcohol use: No    Alcohol/week: 0.0 standard drinks of alcohol   Drug use: No   Sexual activity: Not on file  Other Topics Concern   Not on file  Social History Narrative   Not on file   Social Drivers of Health   Tobacco Use: Low Risk (11/28/2024)   Patient History    Smoking Tobacco Use: Never    Smokeless Tobacco Use: Never    Passive Exposure: Never  Financial Resource Strain: Low Risk (06/05/2024)   Received from [facility]   Overall Financial Resource Strain (CARDIA)    How hard is it for you to pay for the very basics like food, housing, medical care, and heating?: Not very hard  Food Insecurity: No Food Insecurity (06/05/2024)   Received from [facility]   Epic    Within the past 12 months, you worried that your food would run out before you got the money to buy more.: Never true    Within the past 12 months, the food you bought just didn't last and you didn't have money to get more.: Never true  Transportation Needs: No Transportation Needs (06/05/2024)   Received from [facility]   PRAPARE - Transportation    Lack of Transportation (Medical): No    Lack of Transportation (Non-Medical): No  Physical Activity: Sufficiently Active (09/29/2023)   Exercise Vital Sign    Days of Exercise per Week: 5 days    Minutes of Exercise per Session: 30 min  Stress: No Stress Concern Present (09/29/2023)   [employer] of Occupational Health - Occupational Stress Questionnaire    Feeling of Stress : Not at all  Social Connections: Moderately Isolated (09/29/2023)   Social  Connection and Isolation Panel    Frequency of Communication with Friends and Family: More than three times a week    Frequency of Social Gatherings with Friends and Family: Three times a week    Attends Religious Services: 1 to 4 times per year    Active Member of Clubs or Organizations: No    Attends Banker Meetings: Never    Marital Status: Divorced  Catering Manager Violence: Not At Risk (09/29/2023)   Humiliation, Afraid, Rape, and Kick questionnaire    Fear of Current or Ex-Partner: No    Emotionally Abused: No    Physically Abused: No    Sexually Abused: No  Depression (PHQ2-9): Low Risk (11/18/2024)   Depression (PHQ2-9)    PHQ-2 Score: 0  Alcohol Screen: Low Risk (09/29/2023)   Alcohol Screen    Last Alcohol Screening Score (AUDIT): 0  Housing: Low Risk (09/29/2023)   Housing    Last Housing Risk Score: 0  Utilities: Not At Risk (09/29/2023)   AHC Utilities    Threatened with loss of utilities: No  Health Literacy: Adequate Health Literacy (09/29/2023)   B1300 Health Literacy    Frequency of need for help with medical instructions: Never    Review of Systems:    Constitutional: No weight loss, fever, chills, weakness or fatigue HEENT: Eyes: No change in vision               Ears, Nose, Throat:  No change in hearing or congestion Skin: No rash or itching Cardiovascular: No chest pain, chest pressure or palpitations   Respiratory: No SOB or cough Gastrointestinal: See HPI and otherwise negative Genitourinary: No dysuria or change in urinary frequency Neurological: No headache, dizziness or syncope Musculoskeletal: No new muscle or joint pain Hematologic: No bleeding or bruising Psychiatric: No history of depression or anxiety    Physical Exam:  Vital signs: BP 118/62   Pulse 81   Ht 5' 7 (1.702 m)   Wt 152 lb (68.9 kg)   BMI 23.81 kg/m   Constitutional: NAD, alert and cooperative Head:  Normocephalic and atraumatic. Eyes:   PEERL, EOMI. No  icterus. Conjunctiva pink. Respiratory: Respirations even and unlabored. Lungs clear to auscultation bilaterally.   No wheezes, crackles, or rhonchi.  Cardiovascular:  Regular rate and rhythm. No peripheral edema, cyanosis or pallor.  Gastrointestinal:  Soft, nondistended, nontender. No rebound or guarding. Normal bowel sounds. No appreciable masses or hepatomegaly. Rectal:  Declines Msk:  Symmetrical without gross deformities. Without edema, no deformity or joint abnormality.  Neurologic:  Alert and  oriented x4;  grossly normal neurologically.  Skin:   Dry and intact without significant lesions or rashes. Psychiatric: Oriented to person, place and time. Demonstrates good judgement and reason without abnormal affect or behaviors.  Physical Exam    RELEVANT LABS AND IMAGING: CBC    Component Value Date/Time   WBC 6.5 04/06/2023 1740   WBC 8.9 03/30/2023 0427   RBC 5.25 04/06/2023 1740   RBC 5.09 03/30/2023 0427   HGB 15.8 04/06/2023 1740   HCT 46.8 04/06/2023 1740   PLT 200 04/06/2023 1740   MCV 89 04/06/2023 1740   MCH 30.1 04/06/2023 1740   MCH 30.3 03/30/2023 0427   MCHC 33.8 04/06/2023 1740   MCHC 34.7 03/30/2023 0427   RDW 12.5 04/06/2023 1740   LYMPHSABS 2.1 03/27/2023 2308   LYMPHSABS 1.5 12/11/2019 1226   MONOABS 0.9 03/27/2023 2308   EOSABS 0.5 03/27/2023 2308   EOSABS 0.1 12/11/2019 1226   BASOSABS 0.0 03/27/2023 2308   BASOSABS 0.1 12/11/2019 1226    CMP     Component Value Date/Time   NA 141 11/18/2024 1407   K 4.3 11/18/2024 1407   CL 102 11/18/2024 1407   CO2 24 11/18/2024 1407   GLUCOSE 81 11/18/2024 1407   GLUCOSE 103 (H) 03/30/2023 0052   BUN 15 11/18/2024 1407   CREATININE 0.98 11/18/2024 1407   CREATININE 1.08 10/05/2016 1633   CALCIUM  9.8 11/18/2024 1407   PROT 7.4 04/06/2023 1740   ALBUMIN 4.5 04/06/2023 1740   AST 28 04/06/2023 1740   ALT 44 04/06/2023 1740   ALKPHOS 96 04/06/2023 1740  BILITOT 0.4 04/06/2023 1740   GFRNONAA >60  03/30/2023 0052   GFRNONAA 71 10/05/2016 1633   GFRAA 75 12/11/2019 1226   GFRAA 82 10/05/2016 1633     Assessment/Plan:   History of colon polyps Colon polyps on colonoscopy 2016 with Dr. Shaaron.  Last colonoscopy 01/2021 with Dr. Leigh with hyperplastic polyp and seeds in cecum, transverse, left colon limiting visualization so repeat in 2 years is recommended - Repeat colonoscopy - I thoroughly discussed the procedure with the patient (at bedside) to include nature of the procedure, alternatives, benefits, and risks (including but not limited to bleeding, infection, perforation, anesthesia/cardiac pulmonary complications).  Patient verbalized understanding and gave verbal consent to proceed with procedure.   Ischemic cardiomyopathy NSTEMI 2024 LVEF 55-60 %, follows with Dr. Wendel On Plavix  - Will hold Plavix  5 days prior to endoscopic procedures - will instruct when and how to resume after procedure. Benefits and risks of procedure explained including risks of bleeding, perforation, infection, missed lesions, reactions to medications and possible need for hospitalization and surgery for complications. Additional rare but real risk of stroke or other vascular clotting events off Plavix  also explained and need to seek urgent help if any signs of these problems occur. Will communicate by phone or EMR with patient's  prescribing provider to confirm that holding Plavix  is reasonable in this case.    Hypertension  Diabetes  Tad Fancher Mollie RIGGERS [facility] 11/28/2024, 3:40 PM  Cc: McDiarmid, Krystal BIRCH, MD

## 2024-11-28 NOTE — Patient Instructions (Addendum)
 _______________________________________________________  If your blood pressure at your visit was 140/90 or greater, please contact your primary care physician to follow up on this.  _______________________________________________________  If you are age 74 or older, your body mass index should be between 23-30. Your Body mass index is 23.81 kg/m. If this is out of the aforementioned range listed, please consider follow up with your Primary Care Provider.  If you are age 20 or younger, your body mass index should be between 19-25. Your Body mass index is 23.81 kg/m. If this is out of the aformentioned range listed, please consider follow up with your Primary Care Provider.   ________________________________________________________  The [facility] providers would like to encourage you to use MYCHART to communicate with providers for non-urgent requests or questions.  Due to long hold times on the telephone, sending your provider a message by [facility] may be a faster and more efficient way to get a response.  Please allow 48 business hours for a response.  Please remember that this is for non-urgent requests.  _______________________________________________________  [facility] is using a team-based approach to care.  Your team is made up of your doctor and two to three APPS. Our APPS (Nurse Practitioners and Physician Assistants) work with your physician to ensure care continuity for you. They are fully qualified to address your health concerns and develop a treatment plan. They communicate directly with your gastroenterologist to care for you. Seeing the Advanced Practice Practitioners on your physician's team can help you by facilitating care more promptly, often allowing for earlier appointments, access to diagnostic testing, procedures, and other specialty referrals.   You have been scheduled for a colonoscopy. Please follow written instructions given to you at your visit today.    If you use inhalers (even only as needed), please bring them with you on the day of your procedure.  DO NOT TAKE 7 DAYS PRIOR TO TEST- Trulicity (dulaglutide) Ozempic, Wegovy (semaglutide) Mounjaro, Zepbound (tirzepatide) Bydureon Bcise (exanatide extended release)  DO NOT TAKE 1 DAY PRIOR TO YOUR TEST Rybelsus (semaglutide) Adlyxin (lixisenatide) Victoza (liraglutide) Byetta (exanatide) ___________________________________________________________________________   Due to recent changes in healthcare laws, you may see the results of your imaging and laboratory studies on MyChart before your provider has had a chance to review them.  We understand that in some cases there may be results that are confusing or concerning to you. Not all laboratory results come back in the same time frame and the provider may be waiting for multiple results in order to interpret others.  Please give us  48 hours in order for your provider to thoroughly review all the results before contacting the office for clarification of your results.   You can contact [facility] at [phone] to arrange for someone to drive you to your procedure, wait, and drive you home. The cost is approximately $100 (includes 4 hours), plus mileage. (Please note: Rates are subject to change)  You can also contact [facility] to arrange someone to drive you to your procedure, wait and drive you home. Can call [phone] or go to [email or web address]

## 2024-11-28 NOTE — Telephone Encounter (Signed)
 Orren, you recently saw this pt in clinic. Are you able to comment on surgical clearance for upcoming Colonoscopy scheduled for 12-27-2024? Please route your response to P CV DIV PREOP. Thank you!

## 2024-11-28 NOTE — Telephone Encounter (Signed)
 [facility] Pre-operative Risk Assessment     Request for surgical clearance:     Endoscopy Procedure  What type of surgery is being performed?     Colonoscopy  When is this surgery scheduled?     12-27-24  What type of clearance is required ?   Pharmacy  Are there any medications that need to be held prior to surgery and how long? Plavix  x 5 days  Practice name and name of physician performing surgery?      [facility]  What is your office phone and fax number?      Phone- [phone]  Fax- [phone]  Anesthesia type (None, local, MAC, general) ?       MAC   Please route your response to Nat Sic, CMA

## 2024-11-29 ENCOUNTER — Telehealth: Payer: Self-pay

## 2024-11-29 NOTE — Telephone Encounter (Signed)
 Left message for patient to return call to our office to make him aware that we should change his prep to 2 days per Dr Leigh.  Will continue efforts.

## 2024-11-29 NOTE — Progress Notes (Signed)
 Agree with assessment and plan as outlined.  Bayley can you make sure this patient has a 2 day prep for this upcoming exam given limited prep on the last exam. Thanks

## 2024-11-29 NOTE — Telephone Encounter (Signed)
" ° °  Patient Name: Frederick Knight  DOB: 10-21-1951 MRN: [id number]  Primary Cardiologist: Arun K Thukkani, MD  Chart reviewed as part of pre-operative protocol coverage. Pre-op clearance already addressed by colleagues in earlier phone notes. To summarize recommendations:  -This patient was doing well at his last office visit with me back in Oct. He is able to move forward with his colonoscopy without further CV testing. He can hold plavix  for 5 days as long as no new cardiovascular symptoms.   Will route this bundled recommendation to requesting provider via Epic fax function and remove from pre-op pool. Please call with questions.  Orren LOISE Fabry, PA-C 11/29/2024, 8:55 AM  "

## 2024-12-02 NOTE — Telephone Encounter (Signed)
 Left message for patient to return call to our office to make him aware that we should change his prep to 2 days per Dr Leigh.  Will continue efforts.

## 2024-12-03 NOTE — Telephone Encounter (Signed)
 Patient advised that he has been given clearance to hold Plavix  5 days prior to colonoscopy scheduled for 12-27-24.  Patient advised to take last dose of Plavix  on 12-21-24, and he will be advised when to restart Plavix  by Dr Leigh after the procedure.  Patient agreed to plan and verbalized understanding.  No further questions.

## 2024-12-03 NOTE — Telephone Encounter (Signed)
 Patient aware that Dr Leigh has reviewed his chart and would prefer for him to complete a 2 day bowel prep prior to his colonoscopy scheduled for 12-27-24.  Patient advised that new instructions would be updated in his MyChart.  Copy of instructions mailed to his home as well.  Patient agreed to plan and verbalized understanding.  No further questions or concerns.

## 2024-12-09 ENCOUNTER — Telehealth: Payer: Self-pay | Admitting: Pharmacy Technician

## 2024-12-09 ENCOUNTER — Telehealth: Payer: Self-pay

## 2024-12-09 NOTE — Telephone Encounter (Signed)
 Will calling pt to review mother's test results pt inquired about Jardiance  Pt assistance.  Advised pt to drop paperwork off at our office on the 5th floor and it will be given to our pt assistance coordinator.  Pt expresses understanding.

## 2024-12-09 NOTE — Telephone Encounter (Signed)
 Patient application has been faxed to company- provider portion uploaded to media and application also uploaded to media

## 2024-12-17 NOTE — Telephone Encounter (Signed)
 Waiting for provider portion

## 2024-12-18 NOTE — Telephone Encounter (Signed)
 This is not a Dr. Wendel pt. Frederick Knight is the provider this pt has seen and that prescribed Jardiance . Completed forms are in the mailbox.

## 2024-12-19 NOTE — Telephone Encounter (Signed)
 Pt calling to say the forms sent were missing page #5. He is asking this be completed and sent. Please advise.

## 2024-12-20 NOTE — Telephone Encounter (Signed)
 Pt calling to follow up on forms being faxed, he would like a c/b regarding this matter. Please advise

## 2024-12-23 NOTE — Telephone Encounter (Signed)
 Patient is returning a call for update. Please advise

## 2024-12-27 ENCOUNTER — Ambulatory Visit: Admitting: Gastroenterology

## 2024-12-27 ENCOUNTER — Encounter: Payer: Self-pay | Admitting: Gastroenterology

## 2024-12-27 VITALS — BP 117/71 | HR 64 | Temp 97.2°F | Resp 18 | Ht 67.0 in | Wt 152.0 lb

## 2024-12-27 DIAGNOSIS — Z860100 Personal history of colon polyps, unspecified: Secondary | ICD-10-CM | POA: Diagnosis not present

## 2024-12-27 DIAGNOSIS — Z1211 Encounter for screening for malignant neoplasm of colon: Secondary | ICD-10-CM

## 2024-12-27 DIAGNOSIS — K648 Other hemorrhoids: Secondary | ICD-10-CM | POA: Diagnosis not present

## 2024-12-27 DIAGNOSIS — D123 Benign neoplasm of transverse colon: Secondary | ICD-10-CM

## 2024-12-27 DIAGNOSIS — D120 Benign neoplasm of cecum: Secondary | ICD-10-CM

## 2024-12-27 DIAGNOSIS — K621 Rectal polyp: Secondary | ICD-10-CM

## 2024-12-27 DIAGNOSIS — D125 Benign neoplasm of sigmoid colon: Secondary | ICD-10-CM

## 2024-12-27 DIAGNOSIS — D128 Benign neoplasm of rectum: Secondary | ICD-10-CM

## 2024-12-27 MED ORDER — DEXTROSE 5 % IV SOLN: INTRAVENOUS | Status: AC

## 2024-12-27 MED ORDER — SODIUM CHLORIDE 0.9 % IV SOLN: 500.0000 mL | INTRAVENOUS | Status: DC

## 2024-12-27 NOTE — Progress Notes (Signed)
 Sed[place], gd SR, tolerated procedure well, VSS, report to RN

## 2024-12-27 NOTE — Progress Notes (Signed)
 [facility] History and Physical   Primary Care Physician:  Howell Lunger, DO   Reason for Procedure:   History of colon polyps  Plan:    colonoscopy     HPI: Frederick Knight is a 74 y.o. male  here for colonoscopy surveillance - last colonoscopy in 01/2021 - limited by fair prep, one small polyp removed. Double prep recommended for this exam.   . Patient denies any bowel symptoms at this time. No family history of colon cancer known. Otherwise feels well without any cardiopulmonary symptoms.   I have discussed risks / benefits of anesthesia and endoscopic procedure with Frederick Knight and they wish to proceed with the exams as outlined today.   The patient was provided an opportunity to ask questions and all were answered. The patient agreed with the plan.    Past Medical History:  Diagnosis Date   Diabetes mellitus without complication (HCC)    Hyperlipemia    Controlled well with medications    Past Surgical History:  Procedure Laterality Date   COLONOSCOPY N/A 01/15/2015   RMR:  Multiple polyps removed as described above.   COLONOSCOPY  01/28/2021   Elspeth Naval [facility]   COLONOSCOPY  2016   CORONARY STENT INTERVENTION N/A 03/29/2023   Procedure: CORONARY STENT INTERVENTION;  Surgeon: Jordan, Peter M, MD;  Location: [facility] INVASIVE CV LAB;  Service: Cardiovascular;  Laterality: N/A;   CORONARY STENT INTERVENTION N/A 03/28/2023   Procedure: CORONARY STENT INTERVENTION;  Surgeon: Burnard Debby LABOR, MD;  Location: [facility] INVASIVE CV LAB;  Service: Cardiovascular;  Laterality: N/A;   CRANIOTOMY  05/16/2012   Procedure: CRANIOTOMY HEMATOMA EVACUATION SUBDURAL;  Surgeon: Arley SHAUNNA Helling, MD;  Location: [facility] NEURO ORS;  Service: Neurosurgery;  Laterality: N/A;  Right burr holes for evacuation of subdural hematoma   CRANIOTOMY  05/18/2012   Procedure: CRANIOTOMY HEMATOMA EVACUATION SUBDURAL;  Surgeon: Arley SHAUNNA Helling, MD;  Location: [facility] NEURO ORS;  Service: Neurosurgery;  Laterality:  Right;   LEFT HEART CATH AND CORONARY ANGIOGRAPHY N/A 03/28/2023   Procedure: LEFT HEART CATH AND CORONARY ANGIOGRAPHY;  Surgeon: Burnard Debby LABOR, MD;  Location: [facility] INVASIVE CV LAB;  Service: Cardiovascular;  Laterality: N/A;    Prior to Admission medications  Medication Sig Start Date End Date Taking? Authorizing Provider  atorvastatin  (LIPITOR) 80 MG tablet Take 1 tablet (80 mg total) by mouth daily. 10/02/24  Yes Conte, Tessa N, PA-C  empagliflozin  (JARDIANCE ) 10 MG TABS tablet Take 1 tablet (10 mg total) by mouth daily. 10/02/24  Yes Lucien, Tessa N, PA-C  metoprolol  succinate (TOPROL  XL) 25 MG 24 hr tablet Take 1 tablet (25 mg total) by mouth daily. 10/02/24  Yes Conte, Tessa N, PA-C  Na Sulfate-K Sulfate-Mg Sulfate concentrate (SUPREP BOWEL PREP KIT) 17.5-3.13-1.6 GM/177ML SOLN Take 1 kit (354 mLs total) by mouth as directed. 11/28/24  Yes McMichael, Bayley M, PA-C  oxybutynin (DITROPAN-XL) 5 MG 24 hr tablet Take 5 mg by mouth daily. 07/25/23  Yes [provider]  tamsulosin  (FLOMAX ) 0.4 MG CAPS capsule TAKE 1 CAPSULE BY MOUTH EVERY DAY 08/28/24  Yes Howell Lunger, DO  acetaminophen  (TYLENOL ) 325 MG tablet Take 2 tablets (650 mg total) by mouth every 4 (four) hours as needed for headache or mild pain. 03/30/23   Nicholas Bar, MD  clopidogrel  (PLAVIX ) 75 MG tablet Take 1 tablet (75 mg total) by mouth daily. 10/02/24   Lucien Orren SAILOR, PA-C  metFORMIN  (GLUCOPHAGE -XR) 500 MG 24 hr tablet TAKE 1 TABLET BY  MOUTH EVERY DAY WITH BREAKFAST 08/28/24   Howell Lunger, DO    Current Outpatient Medications  Medication Sig Dispense Refill   atorvastatin  (LIPITOR) 80 MG tablet Take 1 tablet (80 mg total) by mouth daily. 90 tablet 3   empagliflozin  (JARDIANCE ) 10 MG TABS tablet Take 1 tablet (10 mg total) by mouth daily. 90 tablet 3   metoprolol  succinate (TOPROL  XL) 25 MG 24 hr tablet Take 1 tablet (25 mg total) by mouth daily. 90 tablet 2   Na Sulfate-K Sulfate-Mg Sulfate concentrate (SUPREP  BOWEL PREP KIT) 17.5-3.13-1.6 GM/177ML SOLN Take 1 kit (354 mLs total) by mouth as directed. 324 mL 0   oxybutynin (DITROPAN-XL) 5 MG 24 hr tablet Take 5 mg by mouth daily.     tamsulosin  (FLOMAX ) 0.4 MG CAPS capsule TAKE 1 CAPSULE BY MOUTH EVERY DAY 90 capsule 1   acetaminophen  (TYLENOL ) 325 MG tablet Take 2 tablets (650 mg total) by mouth every 4 (four) hours as needed for headache or mild pain. 30 tablet 1   clopidogrel  (PLAVIX ) 75 MG tablet Take 1 tablet (75 mg total) by mouth daily. 90 tablet 3   metFORMIN  (GLUCOPHAGE -XR) 500 MG 24 hr tablet TAKE 1 TABLET BY MOUTH EVERY DAY WITH BREAKFAST 90 tablet 1   Current Facility-Administered Medications  Medication Dose Route Frequency Provider Last Rate Last Admin   0.9 %  sodium chloride  infusion  500 mL Intravenous Continuous Chue Berkovich, Elspeth SQUIBB, MD       dextrose  5 % solution   Intravenous Continuous Ladonya Jerkins, Elspeth SQUIBB, MD        Allergies as of 12/27/2024   (No Known Allergies)    Family History  Problem Relation Age of Onset   Stroke Mother    Colon cancer Neg Hx    Esophageal cancer Neg Hx    Rectal cancer Neg Hx    Stomach cancer Neg Hx    Colon polyps Neg Hx     Social History   Socioeconomic History   Marital status: Divorced    Spouse name: Not on file   Number of children: Not on file   Years of education: Not on file   Highest education level: Not on file  Occupational History   Not on file  Tobacco Use   Smoking status: Never    Passive exposure: Never   Smokeless tobacco: Never  Vaping Use   Vaping status: Never Used  Substance and Sexual Activity   Alcohol use: No    Alcohol/week: 0.0 standard drinks of alcohol   Drug use: No   Sexual activity: Not on file  Other Topics Concern   Not on file  Social History Narrative   Not on file   Social Drivers of Health   Tobacco Use: Low Risk (12/27/2024)   Patient History    Smoking Tobacco Use: Never    Smokeless Tobacco Use: Never    Passive Exposure:  Never  Financial Resource Strain: Low Risk (06/05/2024)   Received from [facility]   Overall Financial Resource Strain (CARDIA)    How hard is it for you to pay for the very basics like food, housing, medical care, and heating?: Not very hard  Food Insecurity: No Food Insecurity (06/05/2024)   Received from [facility]   Epic    Within the past 12 months, you worried that your food would run out before you got the money to buy more.: Never true    Within the past 12 months, the  food you bought just didn't last and you didn't have money to get more.: Never true  Transportation Needs: No Transportation Needs (06/05/2024)   Received from [facility] - Transportation    Lack of Transportation (Medical): No    Lack of Transportation (Non-Medical): No  Physical Activity: Sufficiently Active (09/29/2023)   Exercise Vital Sign    Days of Exercise per Week: 5 days    Minutes of Exercise per Session: 30 min  Stress: No Stress Concern Present (09/29/2023)   [employer] of Occupational Health - Occupational Stress Questionnaire    Feeling of Stress : Not at all  Social Connections: Moderately Isolated (09/29/2023)   Social Connection and Isolation Panel    Frequency of Communication with Friends and Family: More than three times a week    Frequency of Social Gatherings with Friends and Family: Three times a week    Attends Religious Services: 1 to 4 times per year    Active Member of Clubs or Organizations: No    Attends Banker Meetings: Never    Marital Status: Divorced  Catering Manager Violence: Not At Risk (09/29/2023)   Humiliation, Afraid, Rape, and Kick questionnaire    Fear of Current or Ex-Partner: No    Emotionally Abused: No    Physically Abused: No    Sexually Abused: No  Depression (PHQ2-9): Low Risk (11/18/2024)   Depression (PHQ2-9)    PHQ-2 Score: 0  Alcohol Screen: Low Risk (09/29/2023)   Alcohol Screen    Last Alcohol Screening  Score (AUDIT): 0  Housing: Low Risk (09/29/2023)   Housing    Last Housing Risk Score: 0  Utilities: Not At Risk (09/29/2023)   [facility] Utilities    Threatened with loss of utilities: No  Health Literacy: Adequate Health Literacy (09/29/2023)   B1300 Health Literacy    Frequency of need for help with medical instructions: Never    Review of Systems: All other review of systems negative except as mentioned in the HPI.  Physical Exam: Vital signs BP 114/73   Pulse 89   Temp (!) 97.2 F (36.2 C) (Temporal)   Ht 5' 7 (1.702 m)   Wt 152 lb (68.9 kg)   SpO2 96%   BMI 23.81 kg/m   General:   Alert,  Well-developed, pleasant and cooperative in NAD Lungs:  Clear throughout to auscultation.   Heart:  Regular rate and rhythm Abdomen:  Soft, nontender and nondistended.   Neuro/Psych:  Alert and cooperative. Normal mood and affect. A and O x 3  Marcey Naval, MD [facility]

## 2024-12-27 NOTE — Progress Notes (Signed)
 Called to room to assist during endoscopic procedure.  Patient ID and intended procedure confirmed with present staff. Received instructions for my participation in the procedure from the performing physician.

## 2024-12-27 NOTE — Progress Notes (Signed)
 Pt's states no medical or surgical changes since previsit or office visit.

## 2024-12-27 NOTE — Patient Instructions (Signed)
 Resume previous diet  Continue present medications  Resume Plavix  tomorrow  Await pathology results  See handout for polyps  YOU HAD AN ENDOSCOPIC PROCEDURE TODAY AT THE [facility]:   Refer to the procedure report that was given to you for any specific questions about what was found during the examination.  If the procedure report does not answer your questions, please call your gastroenterologist to clarify.  If you requested that your care partner not be given the details of your procedure findings, then the procedure report has been included in a sealed envelope for you to review at your convenience later.  YOU SHOULD EXPECT: Some feelings of bloating in the abdomen. Passage of more gas than usual.  Walking can help get rid of the air that was put into your GI tract during the procedure and reduce the bloating. If you had a lower endoscopy (such as a colonoscopy or flexible sigmoidoscopy) you may notice spotting of blood in your stool or on the toilet paper. If you underwent a bowel prep for your procedure, you may not have a normal bowel movement for a few days.  Please Note:  You might notice some irritation and congestion in your nose or some drainage.  This is from the oxygen used during your procedure.  There is no need for concern and it should clear up in a day or so.  SYMPTOMS TO REPORT IMMEDIATELY: Following lower endoscopy (colonoscopy or flexible sigmoidoscopy):  Excessive amounts of blood in the stool  Significant tenderness or worsening of abdominal pains  Swelling of the abdomen that is new, acute  Fever of 100F or higher  For urgent or emergent issues, a gastroenterologist can be reached at any hour by calling [phone]. Do not use MyChart messaging for urgent concerns.   DIET:  We do recommend a small meal at first, but then you may proceed to your regular diet.  Drink plenty of fluids but you should avoid alcoholic beverages for 24 hours.  ACTIVITY:  You  should plan to take it easy for the rest of today and you should NOT DRIVE or use heavy machinery until tomorrow (because of the sedation medicines used during the test).    FOLLOW UP: Our staff will call the number listed on your records the next business day following your procedure.  We will call around 7:15- 8:00 am to check on you and address any questions or concerns that you may have regarding the information given to you following your procedure. If we do not reach you, we will leave a message.     If any biopsies were taken you will be contacted by phone or by letter within the next 1-3 weeks.  Please call us  at [phone] if you have not heard about the biopsies in 3 weeks.   SIGNATURES/CONFIDENTIALITY: You and/or your care partner have signed paperwork which will be entered into your electronic medical record.  These signatures attest to the fact that that the information above on your After Visit Summary has been reviewed and is understood.  Full responsibility of the confidentiality of this discharge information lies with you and/or your care-partner.

## 2024-12-27 NOTE — Op Note (Signed)
 [facility] Patient Name: Frederick Knight Procedure Date: 12/27/2024 2:07 PM MRN: [id number] Endoscopist: Elspeth P. Leigh , MD, [id number] Age: 74 Referring MD:  Date of Birth: 1951/05/12 Gender: Male Account #: [id number] Procedure:                Colonoscopy Indications:              High risk colon cancer surveillance: Personal                            history of colonic polyps - last exam 2022 - fair                            prep, no high risk lesions. Double prep for this                            exam. Medicines:                Monitored Anesthesia Care Procedure:                Pre-Anesthesia Assessment:                           - Prior to the procedure, a History and Physical                            was performed, and patient medications and                            allergies were reviewed. The patient's tolerance of                            previous anesthesia was also reviewed. The risks                            and benefits of the procedure and the sedation                            options and risks were discussed with the patient.                            All questions were answered, and informed consent                            was obtained. Prior Anticoagulants: The patient has                            taken Plavix  (clopidogrel ), last dose was 5 days                            prior to procedure. ASA Grade Assessment: II - A                            patient with mild systemic disease. After reviewing  the risks and benefits, the patient was deemed in                            satisfactory condition to undergo the procedure.                           After obtaining informed consent, the colonoscope                            was passed under direct vision. Throughout the                            procedure, the patient's blood pressure, pulse, and                            oxygen saturations were  monitored continuously. The                            Olympus Scope [id number] was introduced through the                            anus and advanced to the the cecum, identified by                            appendiceal orifice and ileocecal valve. The                            colonoscopy was performed without difficulty. The                            patient tolerated the procedure well. The quality                            of the bowel preparation was good. The ileocecal                            valve, appendiceal orifice, and rectum were                            photographed. Scope In: 2:16:18 PM Scope Out: 2:37:15 PM Scope Withdrawal Time: 0 hours 15 minutes 2 seconds  Total Procedure Duration: 0 hours 20 minutes 57 seconds  Findings:                 The perianal and digital rectal examinations were                            normal.                           A 3 mm polyp was found in the cecum. The polyp was                            flat. The polyp was removed with a cold snare.  Resection and retrieval were complete.                           A 3 mm polyp was found in the transverse colon. The                            polyp was sessile. The polyp was removed with a                            cold snare. Resection and retrieval were complete.                           A 3 mm polyp was found in the sigmoid colon. The                            polyp was sessile. The polyp was removed with a                            cold snare. Resection and retrieval were complete.                           A 3 mm polyp was found in the distal rectum. The                            polyp was sessile. The polyp was removed with a                            cold snare. Resection and retrieval were complete.                           Internal hemorrhoids were found during retroflexion.                           The exam was otherwise without  abnormality. Complications:            No immediate complications. Estimated blood loss:                            Minimal. Estimated Blood Loss:     Estimated blood loss was minimal. Impression:               - One 3 mm polyp in the cecum, removed with a cold                            snare. Resected and retrieved.                           - One 3 mm polyp in the transverse colon, removed                            with a cold snare. Resected and retrieved.                           -  One 3 mm polyp in the sigmoid colon, removed with                            a cold snare. Resected and retrieved.                           - One 3 mm polyp in the distal rectum, removed with                            a cold snare. Resected and retrieved.                           - Internal hemorrhoids.                           - The examination was otherwise normal. Recommendation:           - Patient has a contact number available for                            emergencies. The signs and symptoms of potential                            delayed complications were discussed with the                            patient. Return to normal activities tomorrow.                            Written discharge instructions were provided to the                            patient.                           - Resume previous diet.                           - Continue present medications.                           - Resume Plavix  tomorrow.                           - Await pathology results. Elspeth P. Leigh, MD 12/27/2024 2:44:21 PM This report has been signed electronically.

## 2024-12-27 NOTE — Progress Notes (Signed)
 Pt stated that he took Jardiance  today. Reported to MD who will proceed with procedure.

## 2024-12-30 ENCOUNTER — Telehealth: Payer: Self-pay | Admitting: Lactation Services

## 2024-12-30 NOTE — Telephone Encounter (Signed)
 No answer left voice mail

## 2025-01-01 NOTE — Telephone Encounter (Signed)
 Called pt advised of update and answered all questions.  Advised pt to call back if has further questions or concerns.

## 2025-01-01 NOTE — Telephone Encounter (Signed)
 Pt calling for update. States we did not put the MG or how many times a day. Please advise.

## 2025-01-01 NOTE — Telephone Encounter (Signed)
 Provider portion returned today and faxed to [facility]

## 2025-01-02 ENCOUNTER — Ambulatory Visit: Payer: Self-pay | Admitting: Gastroenterology

## 2025-01-02 LAB — SURGICAL PATHOLOGY

## 2025-01-10 ENCOUNTER — Ambulatory Visit: Payer: Self-pay | Admitting: Student

## 2025-01-14 ENCOUNTER — Telehealth: Payer: Self-pay | Admitting: Physician Assistant

## 2025-01-15 NOTE — Telephone Encounter (Signed)
 Pt states he called number today and they still do not have updated form needed. Please advise.

## 2025-01-16 NOTE — Telephone Encounter (Signed)
 PAP: Patient assistance application for Jardiance  has been approved by PAP Companies: [employer] from 01/15/25 to 12/11/25. Medication should be delivered to PAP Delivery: Home. For further shipping updates, please contact [employer] at [phone]. Patient ID is: [id number]

## 2025-01-16 NOTE — Telephone Encounter (Signed)
 Just an update- this was approved and should be shipped out soon

## 2025-01-16 NOTE — Telephone Encounter (Signed)
 Pt requesting a c/b with a update

## 2025-02-03 ENCOUNTER — Ambulatory Visit: Admitting: Dermatology

## 2025-02-06 ENCOUNTER — Ambulatory Visit: Admitting: Dermatology

## 2025-03-03 ENCOUNTER — Encounter
